# Patient Record
Sex: Male | Born: 1945 | Race: White | Hispanic: No
Health system: Midwestern US, Community
[De-identification: ages and names within clinical notes are randomized; demographics above are authoritative.]

## PROBLEM LIST (undated history)

## (undated) DIAGNOSIS — R109 Unspecified abdominal pain: Secondary | ICD-10-CM

## (undated) DIAGNOSIS — C61 Malignant neoplasm of prostate: Secondary | ICD-10-CM

## (undated) DIAGNOSIS — R972 Elevated prostate specific antigen [PSA]: Secondary | ICD-10-CM

## (undated) DIAGNOSIS — I4891 Unspecified atrial fibrillation: Secondary | ICD-10-CM

---

## 2012-06-26 NOTE — Progress Notes (Signed)
Patient here for voiding trial this morning per Dr. Theodora Blow order. Inserted 120 ccs of sterile water into patient's bladder via catheter/syringe at gravity. 10 ccs of clear fluid removed from balloon. Removed 16 fr catheter without any difficulty. Patient voided 100 ccs of clear yellow urine. Patient advised to drink plenty of water today. Patient instructed to call office back at 2 pm to let us know how he is doing/voiding.

## 2012-06-26 NOTE — Progress Notes (Signed)
Patient's daughter(Krissy Hancock, on [place]) called back and reported that he voided 5 times since leaving the office. Patient's daughter reported that he had a steady stream, stream got better with each void. Patient denied straining to urinate. Patient reported leaking a little. Informed patient's daughter to call office back if dad has difficulty urinating. Patient's daughter knows to go to ER if patient unable to urinate during the middle of night. Made patient's daughter aware of our [facility] hours, 7 am- 2 pm.

## 2012-06-27 LAB — AMB POC URINALYSIS DIP STICK AUTO W/O MICRO
Bilirubin (UA POC): NEGATIVE
Ketones (UA POC): NEGATIVE
Leukocyte esterase (UA POC): NEGATIVE
Nitrites (UA POC): NEGATIVE
Protein (UA POC): NEGATIVE mg/dL
Specific gravity (UA POC): 1.02 (ref 1.001–1.035)
Urobilinogen (UA POC): 0.2 (ref 0.2–1)
pH (UA POC): 6 (ref 4.6–8.0)

## 2012-06-27 LAB — AMB POC PVR, MEAS,POST-VOID RES,US,NON-IMAGING: PVR: 236 cc

## 2012-06-27 NOTE — Progress Notes (Signed)
Assessment/Plan:             1. BPH (benign prostatic hypertrophy) with urinary obstruction     2. Urinary retention  AMB POC URINALYSIS DIP STICK AUTO W/O MICRO, AMB POC PVR, MEAS,POST-VOID RES,US,NON-IMAGING     Increase Flomax to 2 at a time  F/U tomorrow for PVR        Subjective:   [name] is a 66 y.o. Caucasian male who is self-referred for evaluation and treatment of urinary difficulty.  Pt had a VT yesterday and was having voiding last night.  States he stream was dribbling, he had freq/urgency, and was voiding about every 1 hr.  States he is urinating better since he has been up moving around.    Current Outpatient Prescriptions   Medication Sig Dispense Refill   ??? tamsulosin (FLOMAX) 0.4 mg capsule Take 0.4 mg by mouth daily.         No Known Allergies  History reviewed. No pertinent past medical history.  History reviewed. No pertinent past surgical history.  Family History   Problem Relation Age of Onset   ??? Diabetes Mother    ??? Hypertension Mother    ??? Diabetes Sister    ??? Hypertension Sister      History   Substance Use Topics   ??? Smoking status: Never Smoker    ??? Smokeless tobacco: Not on file   ??? Alcohol Use: No        Review of Systems  Const: Neg for fever, neg for chills, neg for weight loss, neg for change in appetite, neg for changes in energy level  Eyes: Neg for visual disturbance, neg for pain, neg for discharge  ENT: Neg for difficulty speaking, neg for pain with swallowing, neg for hearing difficulty  Resp: Neg for Shortness of breath, neg for cough, neg for sputum, neg for hemoptysis  Cardio: Neg for chest pain, neg for rapid heartbeat, neg for irregular heartbeat  GU: Neg for history of kidney stones, neg for frequent UTI, neg for flank pain  GI: Neg for constipation, neg for diarrhea, neg for melena, neg for hematochezia  MSK: Neg for bone pain, neg for muscular weakness, neg for muscular tenderness  Skin: Neg for skin conditions, neg for skin rashes  Neuro: Neg for focal  weakness, neg for numbness, neg for seizures  Psych: Neg for history of psychiatric illness  Endo: Neg for diabetes, neg for thyroid disease, neg for excessive urination, neg for excessive thirst, neg for heat intolerance  Lymph: Neg for frequent infections, neg for easy bruising, neg for  lymph node enlargement    Objective:     Estimated Body mass index is 30.41 kg/(m^2) as calculated from the following:    Height as of this encounter: 5\' 8" (1.727 m).    Weight as of this encounter: 200 lb(90.719 kg).   BP 130/76   Ht 5\' 8"  (1.727 m)   Wt 200 lb (90.719 kg)   BMI 30.41 kg/m2  General appearance: alert, cooperative, no distress, appears stated age  Head: Normocephalic, without obvious abnormality, atraumatic  Neck: supple, symmetrical, trachea midline and no JVD  Back: symmetric, no curvature. ROM normal. No CVA tenderness.  Abdomen: normal findings: soft, non-tender  Extremities: extremities normal, atraumatic, no cyanosis or edema  Skin: Skin color, texture, turgor normal. No rashes or lesions  Neurologic: Grossly normal    Review of Labs, Medical Images, tracings or specimens:    Labs reviewed:   UA  Results for orders placed in visit on 06/27/12   AMB POC URINALYSIS DIP STICK AUTO W/O MICRO       Component Value Range    Color (UA POC) Yellow  (none)    Clarity (UA POC) Clear  (none)    Glucose (UA POC) 4+  (none)    Bilirubin (UA POC) Negative  (none)    Ketones (UA POC) Negative  (none)    Specific gravity (UA POC) 1.020  1.001 - 1.035    Blood (UA POC) Trace  (none)    pH (UA POC) 6.0  4.6 - 8.0    Protein (UA POC) Negative  Negative mg/dL    Urobilinogen (UA POC) 0.2 mg/dL  0.2 - 1    Nitrites (UA POC) Negative  (none)    Leukocyte esterase (UA POC) Negative  (none)   AMB POC PVR, MEAS,POST-VOID RES,US,NON-IMAGING       Component Value Range    PVR 236         This note has been sent to the referring physician, with findings and recommendations.

## 2012-06-28 LAB — AMB POC URINALYSIS DIP STICK AUTO W/O MICRO
Bilirubin (UA POC): NEGATIVE
Ketones (UA POC): NEGATIVE
Leukocyte esterase (UA POC): NEGATIVE
Nitrites (UA POC): NEGATIVE
Protein (UA POC): NEGATIVE mg/dL
Specific gravity (UA POC): 1.03 (ref 1.001–1.035)
Urobilinogen (UA POC): 0.2 (ref 0.2–1)
pH (UA POC): 5.5 (ref 4.6–8.0)

## 2012-06-28 LAB — AMB POC PVR, MEAS,POST-VOID RES,US,NON-IMAGING: PVR: 51 cc

## 2012-06-28 NOTE — Progress Notes (Signed)
06-28-12  Pt in for f/u PVR.  States he feels like he is urinating better.  C/O no having a BM since last week.  Told him it was ok to take a laxative.  I want him to continue taking Fliomax BID till done and then samples of Rapaflo given.  He is to f/u on Mon with Dr. Tye Bear Grass.  Results for orders placed in visit on 06/28/12   AMB POC PVR, MEAS,POST-VOID RES,US,NON-IMAGING       Component Value Range    PVR 51     AMB POC URINALYSIS DIP STICK AUTO W/O MICRO       Component Value Range    Color (UA POC) Yellow  (none)    Clarity (UA POC) Clear  (none)    Glucose (UA POC) 2+  (none)    Bilirubin (UA POC) Negative  (none)    Ketones (UA POC) Negative  (none)    Specific gravity (UA POC) 1.030  1.001 - 1.035    Blood (UA POC) Trace  (none)    pH (UA POC) 5.5  4.6 - 8.0    Protein (UA POC) Negative  Negative mg/dL    Urobilinogen (UA POC) 0.2 mg/dL  0.2 - 1    Nitrites (UA POC) Negative  (none)    Leukocyte esterase (UA POC) Negative  (none)       Lytle Butte, NP      06-27-12  Assessment/Plan:             1. Retention, urine  AMB POC PVR, MEAS,POST-VOID RES,US,NON-IMAGING, AMB POC URINALYSIS DIP STICK AUTO W/O MICRO     Increase Flomax to 2 at a time  F/U tomorrow for PVR        Subjective:   [name] is a 66 y.o. Caucasian male who is self-referred for evaluation and treatment of urinary difficulty.  Pt had a VT yesterday and was having voiding last night.  States he stream was dribbling, he had freq/urgency, and was voiding about every 1 hr.  States he is urinating better since he has been up moving around. PVR=236cc    Current Outpatient Prescriptions   Medication Sig Dispense Refill   ??? tamsulosin (FLOMAX) 0.4 mg capsule Take 0.4 mg by mouth daily.         No Known Allergies  History reviewed. No pertinent past medical history.  History reviewed. No pertinent past surgical history.  Family History   Problem Relation Age of Onset   ??? Diabetes Mother    ??? Hypertension Mother    ??? Diabetes Sister    ??? Hypertension  Sister      History   Substance Use Topics   ??? Smoking status: Never Smoker    ??? Smokeless tobacco: Not on file   ??? Alcohol Use: No        Review of Systems  Const: Neg for fever, neg for chills, neg for weight loss, neg for change in appetite, neg for changes in energy level  Eyes: Neg for visual disturbance, neg for pain, neg for discharge  ENT: Neg for difficulty speaking, neg for pain with swallowing, neg for hearing difficulty  Resp: Neg for Shortness of breath, neg for cough, neg for sputum, neg for hemoptysis  Cardio: Neg for chest pain, neg for rapid heartbeat, neg for irregular heartbeat  GU: Neg for history of kidney stones, neg for frequent UTI, neg for flank pain  GI: Neg for constipation, neg for diarrhea, neg for  melena, neg for hematochezia  MSK: Neg for bone pain, neg for muscular weakness, neg for muscular tenderness  Skin: Neg for skin conditions, neg for skin rashes  Neuro: Neg for focal weakness, neg for numbness, neg for seizures  Psych: Neg for history of psychiatric illness  Endo: Neg for diabetes, neg for thyroid disease, neg for excessive urination, neg for excessive thirst, neg for heat intolerance  Lymph: Neg for frequent infections, neg for easy bruising, neg for  lymph node enlargement    Objective:     Estimated Body mass index is 30.41 kg/(m^2) as calculated from the following:    Height as of this encounter: 5\' 8" (1.727 m).    Weight as of this encounter: 200 lb(90.719 kg).   BP 130/76   Ht 5\' 8"  (1.727 m)   Wt 200 lb (90.719 kg)   BMI 30.41 kg/m2  General appearance: alert, cooperative, no distress, appears stated age  Head: Normocephalic, without obvious abnormality, atraumatic  Neck: supple, symmetrical, trachea midline and no JVD  Back: symmetric, no curvature. ROM normal. No CVA tenderness.  Abdomen: normal findings: soft, non-tender  Extremities: extremities normal, atraumatic, no cyanosis or edema  Skin: Skin color, texture, turgor normal. No rashes or lesions  Neurologic:  Grossly normal    Review of Labs, Medical Images, tracings or specimens:    Labs reviewed:   UA  Results for orders placed in visit on 06/28/12   AMB POC PVR, MEAS,POST-VOID RES,US,NON-IMAGING       Component Value Range    PVR 236     AMB POC URINALYSIS DIP STICK AUTO W/O MICRO       Component Value Range    Color (UA POC) Yellow  (none)    Clarity (UA POC) Clear  (none)    Glucose (UA POC) 2+  (none)    Bilirubin (UA POC) Negative  (none)    Ketones (UA POC) Negative  (none)    Specific gravity (UA POC) 1.030  1.001 - 1.035    Blood (UA POC) Trace  (none)    pH (UA POC) 5.5  4.6 - 8.0    Protein (UA POC) Negative  Negative mg/dL    Urobilinogen (UA POC) 0.2 mg/dL  0.2 - 1    Nitrites (UA POC) Negative  (none)    Leukocyte esterase (UA POC) Negative  (none)       This note has been sent to the referring physician, with findings and recommendations.

## 2012-07-01 LAB — AMB POC URINALYSIS DIP STICK AUTO W/O MICRO
Bilirubin (UA POC): NEGATIVE
Blood (UA POC): NEGATIVE
Ketones (UA POC): NEGATIVE
Nitrites (UA POC): NEGATIVE
Protein (UA POC): NEGATIVE mg/dL
Specific gravity (UA POC): 1.03 (ref 1.001–1.035)
Urobilinogen (UA POC): 0.2 (ref 0.2–1)
pH (UA POC): 5 (ref 4.6–8.0)

## 2012-07-01 LAB — AMB POC PVR, MEAS,POST-VOID RES,US,NON-IMAGING: PVR: 82 cc

## 2012-07-01 NOTE — Progress Notes (Signed)
07/01/2012    Assessment:   1. Normal 40g prostate on DRE today with moderate LUTS, taking flomax 0.4mg  daily with benefit  2. Recent AUR following treatment for sinusitis with OTC anti decongestants, foley catheter placed at [facility], subsequently removed 06/27/12. PVR decreasing, 82cc today  3. Microhematuria likely secondary to UTI, adequately treated and resolved  4. Normal PSA history per pt report    Plan:   1. Continue flomax 0.4mg  daily  2. Follow up 6 weeks with PSA, PVR at that time  3. Discontinue antihistamines    Discussion:  We discussed the options for management of LUTS/Benign Prostatic Hyperplasia, including watchful waiting, over the counter supplements, medical therapy, minimally invasive therapies, laser therapy and transurethral resection/coagulative therapies. The risks and benefits of each option were discussed and the patent desires management.    Watchful Waiting: risks and benefits of this approach were discussed including the fact that BPH is usually a progressive disease and can lead to infection, bladder stones, hematuria, incontinence, bladder damage and kidney damage but not limited to these conditions. The need for on-going monitoring, including PSA, DRE, Flow, Bladder scan, and prostate ultrasound.     History of Present Illness:  [name] is a 66 y.o. white male who presents today for LUTS. Patient reports recent sinus infection treated with cefdinir on 06/20/12. Prior to this, he was taking OTC decongestants. He was hospitalized at [facility] for AUR on 06/22/12 where he was catheterized. PVR elevated per pt report. F[facility] catheter removed in [facility] 06/27/12. Prior to hospitalization, he reports moderate LUTS including intermittency, varying stream, post-void dribbling, sensation of incomplete emptying, intermittent urinary frequency. He denies any gross hematuria or dysuria. Patient has been taking flomax 0.4mg  daily with increased nocturia, night sweats. Aside from this, he is  tolerating medication well with some decrease in LUTS.  IPSS:(1,4,2,2,4,1,2 = 16, bother = 3)    PVR today 82cc    No Fam Hx prostate CA.     Foley urine culture 06/22/12: no growth    No past medical history on file.    No past surgical history on file.    Current Outpatient Prescriptions on File Prior to Visit   Medication Sig Dispense Refill   ??? tamsulosin (FLOMAX) 0.4 mg capsule Take 0.4 mg by mouth daily.           History     Social History   ??? Marital Status: DIVORCED     Spouse Name: N/A     Number of Children: N/A   ??? Years of Education: N/A     Occupational History   ??? Not on file.     Social History Main Topics   ??? Smoking status: Never Smoker    ??? Smokeless tobacco: Not on file   ??? Alcohol Use: No   ??? Drug Use: No   ??? Sexually Active: Not on file     Other Topics Concern   ??? Not on file     Social History Narrative   ??? No narrative on file       family history includes Diabetes in his mother and sister and Hypertension in his mother and sister.    No Known Allergies    Review of Systems:  Constitutional: No Fever, Chills, Malaise, Weakness, Diaphoresis  Skin: No Rash, Itch  Endocrine: No hot or cold intolerance.   Hematology: No anemia, bleeding tendency  HENT: No Headache, runny nose, congestion  Eyes: Altered vision  CV: No  Chest pain, Palpitations, Edema  Pulm: No Dyspnea, SOB, Cough, Wheezing  GI: No Nausea, Vomiting, Heartburn, Diarrhea, Constipation, Abd Pain  Musculoskeletal: No Back pain, Joint pain  Neuro: No Dizziness, Numbness, Tingling, Tremors      Physical Exam:  Ht 5\' 8"  (1.727 m)   Wt 200 lb (90.719 kg)   BMI 30.41 kg/m2  WDWN in NAD  HEENT: NCAT, EOMI  Neck: symmetrical, supple, no LAN  Chest: normal respiratory effort, CTA  CV: RRR, no Murmur  Abdomen: soft, NDNT, no guarding/rebound, no mass/HSM, no CVAT, no inguinal hernias  GU: Normal circ penis, patent meatus, Normal scrotum w/ no lesion, Normal testes, Normal epididymis  DRE: Anus and perineum normal, Prostate 40 grams, smooth,  symmetrical, no nodule. Seminal vesicles non-palpable  Extremities: No c/c/e   Neuro: A&O x 3, no focal deficits    Urinalysis shows:   Results for orders placed in visit on 07/01/12   AMB POC URINALYSIS DIP STICK AUTO W/O MICRO       Component Value Range    Color (UA POC) Yellow  (none)    Clarity (UA POC) Clear  (none)    Glucose (UA POC) 1+  (none)    Bilirubin (UA POC) Negative  (none)    Ketones (UA POC) Negative  (none)    Specific gravity (UA POC) 1.030  1.001 - 1.035    Blood (UA POC) Negative  (none)    pH (UA POC) 5.0  4.6 - 8.0    Protein (UA POC) Negative  Negative mg/dL    Urobilinogen (UA POC) 0.2 mg/dL  0.2 - 1    Nitrites (UA POC) Negative  (none)    Leukocyte esterase (UA POC) Trace  (none)   AMB POC PVR, MEAS,POST-VOID RES,US,NON-IMAGING       Component Value Range    PVR 82         Fortino Sic, MD  [facility]         Documentation provided by Ihor Gully, medical scribe for Dr. Fortino Sic on 07/01/2012

## 2012-07-01 NOTE — Patient Instructions (Addendum)
Urine Test: About This Test  What is it?  A urine test, also called urinalysis, checks the color, clarity (clear or cloudy), odor, concentration, and acidity (pH) of your urine. It also checks for abnormal levels of protein, sugar, blood cells, or other substances in your urine.  Why is this test done?  A urine test may be done:  ?? To check for a disease or infection of the urinary tract. The urinary tract includes the kidneys, the tubes that carry urine from the kidneys to the bladder (ureters), the bladder, and the tube that carries urine from the bladder to outside the body (urethra).   ?? To check the treatment of conditions such as diabetes, kidney stones, a urinary tract infection (UTI), high blood pressure, or some kidney or liver diseases.   ?? As part of a regular physical exam.   How can you prepare for the test?  ?? Do not eat foods that can change the color of your urine, such as blackberries, beets, and rhubarb, before the test.   ?? Do not exercise strenuously before the test.   ?? Tell your doctor if you are menstruating or close to starting your menstrual period. Your doctor may want to wait to do the test.   ?? Tell your doctor about all the nonprescription and prescription medicines and herbs or other supplements you take. Some medicines and supplements can affect the results of this test.   What happens during the test?  A urine test can be done in your doctor's office, clinic, or lab. Or you may be asked to collect a urine sample at home and bring it with you to the office or lab to be tested.  Clean-catch midstream urine collection  ?? Wash your hands to make sure they are clean before collecting the urine.   ?? If the collection cup has a lid, remove it carefully and set it down with the inner surface up. Do not touch the inside of the cup with your fingers.   ?? Clean the area around your genitals.   ?? For men: Retract the foreskin, if present, and clean the head of your penis with medicated  towelettes or swabs.   ?? For women: Spread open the genital folds of skin with one hand. Then use medicated towelettes or swabs in your other hand to clean the area where urine comes out (the urethra). Wipe the area from front to back.   ?? Begin urinating into the toilet or urinal. A woman should hold apart the genital folds of skin while she urinates.   ?? After the urine has flowed for several seconds, place the collection cup into the urine stream and collect about 2 ounces of urine without stopping your flow of urine.   ?? Do not touch the rim of the cup to your genital area. Do not get toilet paper, pubic hair, stool (feces), menstrual blood, or anything else in the urine sample.   ?? When you have collected enough urine in the cup, pull the cup away and finish urinating into the toilet or urinal.   ?? Carefully replace and tighten the lid on the cup, and then return it to the lab. If you are collecting the urine at home and cannot get it to the lab in an hour, refrigerate it.   Double-voided urine sample collection  This method collects the urine your body is making right now.  ?? Urinate into the toilet or urinal. Do not collect any   of this urine.   ?? Drink a large glass of water and wait about 30 to 40 minutes.   ?? Then get a urine sample. Follow the instructions above for collecting a clean-catch urine sample.   ?? Return the urine sample to the lab. If you are collecting the urine at home and cannot get it to the lab in an hour, refrigerate it.   Follow-up care is a key part of your treatment and safety. Be sure to make and go to all appointments, and call your doctor if you are having problems. It's also a good idea to keep a list of the medicines you take. Ask your doctor when you can expect to have your test results.    Where can you learn more?    Go to [email or web address]   Enter R266 in the search box to learn more about "Urine Test: About This Test."    ?? 2006-2013 [employer],  In[employer]ated. Care instructions adapted under license by [employer] (which disclaims liability or warranty for this information). This care instruction is for use with your licensed healthcare professional. If you have questions about a medical condition or this instruction, always ask your healthcare professional. [employer], Incorporated disclaims any warranty or liability for your use of this information.  Content Version: 9.7.130178; Last Revised: September 28, 2010        MyChart Activation    Thank you for requesting access to MyChart. Please follow the instructions below to securely access and download your online medical record. MyChart allows you to send messages to your doctor, view your test results, renew your prescriptions, schedule appointments, and more.    How Do I Sign Up?    1. In your internet browser, go to [email or web address]  2. Click on the First Time User? Click Here link in the Sign In box. You will be redirect to the New Member Sign Up page.  3. Enter your MyChart Access Code exactly as it appears below. You will not need to use this code after you???ve completed the sign-up process. If you do not sign up before the expiration date, you must request a new code.    MyChart Access Code: [email or web address]  Expires: 09/26/2012  3:08 PM (This is the date your MyChart access code will expire)    4. Enter the last four digits of your Social Security Number (xxxx) and Date of Birth (mm/dd/yyyy) as indicated and click Submit. You will be taken to the next sign-up page.  5. Create a MyChart ID. This will be your MyChart login ID and cannot be changed, so think of one that is secure and easy to remember.  6. Create a MyChart password. You can change your password at any time.  7. Enter your Password Reset Question and Answer. This can be used at a later time if you forget your password.   8. Enter your e-mail address. You will receive e-mail notification when new information is available in MyChart.   9. Click Sign Up. You can now view and download portions of your medical record.  10. Click the Download Summary menu link to download a portable copy of your medical information.    Additional Information    If you have questions, please visit the Frequently Asked Questions section of the MyChart website at [email or web address] Remember, MyChart is NOT to be used for urgent needs. For medical emergencies, dial 911.

## 2012-08-20 LAB — AMB POC URINALYSIS DIP STICK AUTO W/O MICRO
Bilirubin (UA POC): NEGATIVE
Blood (UA POC): NEGATIVE
Ketones (UA POC): NEGATIVE
Leukocyte esterase (UA POC): NEGATIVE
Nitrites (UA POC): NEGATIVE
Protein (UA POC): NEGATIVE mg/dL
Specific gravity (UA POC): 1.03 (ref 1.001–1.035)
Urobilinogen (UA POC): 0.2 (ref 0.2–1)
pH (UA POC): 5.5 (ref 4.6–8.0)

## 2012-08-20 LAB — PSA, DIAGNOSTIC (PROSTATE SPECIFIC AG): Prostate Specific Ag: 5.99 ng/mL — AB (ref 0.00–4.00)

## 2012-08-20 LAB — AMB POC PVR, MEAS,POST-VOID RES,US,NON-IMAGING: PVR: 54 cc

## 2012-08-20 MED ORDER — SILODOSIN 8 MG CAPSULE
8 mg | ORAL_CAPSULE | Freq: Every day | ORAL | Status: DC
Start: 2012-08-20 — End: 2012-08-27

## 2012-08-20 NOTE — Progress Notes (Signed)
08/20/2012    Assessment:   1. Normal 40g prostate with moderate LUTS, elevated PSA, recent AUR all suggest BPH. Is taking rapaflo with improvement in symptoms. I recommended he continue  2. Recent AUR following treatment for sinusitis with OTC anti decongestants, PVR 54cc today currently on rapaflo  3. Microhematuria likely secondary to UTI, adequately treated and resolved  4. Normal PSA history per pt report, today 5.99ng/ml. I offered continue observation or prostate biopsy. He wants to repeat biopsy in 73mo    Plan:   1. Continue rapaflo 8mg  daily  2. Follow up 73mo with same day PSA, PVR, IPSS   3. Discontinue antihistamines    History of Present Illness:  [name] is a 66 y.o. white male who presents in follow up for LUTS/AUR.  Patient reports recent sinus infection treated with cefdinir on 06/20/12 and he took OTC decongestants. He was hospitalized at [facility] for AUR on 06/22/12 where he was catheterized. He was placed on Rapaflo when I last saw him. He reports 'best stream in years'. He does report retrograde ejaculation. PVR 54cc today. Prior to hospitalization, he reports moderate LUTS including intermittency, varying stream, post-void dribbling, sensation of incomplete emptying, intermittent urinary frequency. He denies any gross hematuria or dysuria. IPSS:(1,4,2,2,4,1,2 = 16, bother = 3)    No Fam Hx prostate CA. Prior prostate biopsy 10years ago normal    Foley urine culture 06/22/12: no growth    No past medical history on file.    No past surgical history on file.    No current outpatient prescriptions on file prior to visit.     No current facility-administered medications on file prior to visit.       History     Social History   ??? Marital Status: DIVORCED     Spouse Name: N/A     Number of Children: N/A   ??? Years of Education: N/A     Occupational History   ??? Not on file.     Social History Main Topics   ??? Smoking status: Never Smoker    ??? Smokeless tobacco: Not on file   ??? Alcohol Use: No   ??? Drug  Use: No   ??? Sexually Active: Not on file     Other Topics Concern   ??? Not on file     Social History Narrative   ??? No narrative on file       family history includes Diabetes in his mother and sister and Hypertension in his mother and sister.    No Known Allergies    Review of Systems:  Constitutional: No Fever, Chills, Malaise, Weakness, Diaphoresis, admits to night sweats  CV: No Chest pain, Palpitations, Edema  Pulm: No Dyspnea, SOB, Cough, Wheezing  GI: No Nausea, Vomiting, Heartburn, Diarrhea, Constipation, Abd Pain    Physical Exam:  BP 112/70   Ht 5\' 8"  (1.727 m)   Wt 200 lb (90.719 kg)   BMI 30.42 kg/m2  WDWN in NAD    Urinalysis shows:   Results for orders placed in visit on 08/20/12   AMB POC URINALYSIS DIP STICK AUTO W/O MICRO       Result Value Range    Color (UA POC) Yellow      Clarity (UA POC) Clear      Glucose (UA POC) 1+  Negative    Bilirubin (UA POC) Negative  Negative    Ketones (UA POC) Negative  Negative    Specific gravity (UA POC)  1.030  1.001 - 1.035    Blood (UA POC) Negative  Negative    pH (UA POC) 5.5  4.6 - 8.0    Protein (UA POC) Negative  Negative mg/dL    Urobilinogen (UA POC) 0.2 mg/dL  0.2 - 1    Nitrites (UA POC) Negative  Negative    Leukocyte esterase (UA POC) Negative  Negative   PROSTATE SPECIFIC AG (PSA)       Result Value Range    Prostate Specific Ag 5.99 (*) 0.00 - 4.00 ng/mL   AMB POC PVR, MEAS,POST-VOID RES,US,NON-IMAGING       Result Value Range    PVR 54         Fortino Sic, MD  [facility]       Documentation provided by Ihor Gully, medical scribe for Dr. Fortino Sic on 08/20/2012

## 2012-08-20 NOTE — Progress Notes (Signed)
Same day PSA obtained via venipuncture without difficulty per Dr. Wason's order. Patient to receive results today in office.

## 2012-08-26 NOTE — Telephone Encounter (Signed)
Pt called the Office asking if the Rapaflo could be changed back to Flomax, he states that he is having night sweats with the Rapaflo.

## 2012-08-27 MED ORDER — TAMSULOSIN SR 0.4 MG 24 HR CAP
0.4 mg | ORAL_CAPSULE | Freq: Every day | ORAL | Status: DC
Start: 2012-08-27 — End: 2013-04-04

## 2012-08-27 NOTE — Telephone Encounter (Signed)
Thanks fine, can you call in flomax? thanks ----- Message ----- From: Kelby Fam Sent: 08/26/2012 4:02 PM To: Arlyce Harman, MD ----- Message ----- From: Corky Mull Sent: 08/26/2012 9:52 AM To: Kelby Fam ----- Message from Corky Mull sent at 08/26/2012 9:52 AM ----- Asking for an medication change, would like to pickup tomorrow while he is here in [place]...jlewis,ma

## 2012-11-05 LAB — AMB POC URINALYSIS DIP STICK AUTO W/O MICRO
Nitrites (UA POC): NEGATIVE
Specific gravity (UA POC): 1.03 (ref 1.001–1.035)
Urobilinogen (UA POC): 1 (ref 0.2–1)
pH (UA POC): 5.5 (ref 4.6–8.0)

## 2012-11-05 LAB — AMB POC PVR, MEAS,POST-VOID RES,US,NON-IMAGING: PVR: 17 cc

## 2012-11-05 NOTE — Progress Notes (Signed)
Assessment/Plan:             1. Retention of urine  AMB POC PVR, MEAS,POST-VOID RES,US,NON-IMAGING, AMB POC URINALYSIS DIP STICK AUTO W/O MICRO, CULTURE, URINE   2. Other nonspecific finding on examination of urine  CULTURE, URINE     Stop taking OTC and prescription Tamiflu and Cheratussin AC  Discussed that these medications could cause problems with urination in men with BPH        Subjective:   [name] is a 67 y.o. Caucasian male who is referred by:  No primary provider on file. for evaluation and treatment of problems with urination.  Pt was placed on Tamiflu and Cheratussin AC and started having problems with urination, such as weakened stream, urinating without warning.  This occurred after starting on the medications.  States he has been urinating fine since his last appointment.  Denies dysuria, freq/urgency.    Patient Active Problem List   Diagnosis Code   ??? Enlarged prostate with lower urinary tract symptoms (LUTS) 600.01   ??? Urinary retention 788.20   ??? UTI (lower urinary tract infection) 599.0     Patient Active Problem List    Diagnosis Date Noted   ??? Enlarged prostate with lower urinary tract symptoms (LUTS) 07/01/2012   ??? Urinary retention 07/01/2012   ??? UTI (lower urinary tract infection) 07/01/2012     Current Outpatient Prescriptions   Medication Sig Dispense Refill   ??? oseltamivir (TAMIFLU) 75 mg capsule Take  by mouth two (2) times a day.       ??? guaiFENesin-codeine (CHERATUSSIN AC) 10-100 mg/5 mL solution Take 5 mL by mouth three (3) times daily as needed for Cough.       ??? tamsulosin (FLOMAX) 0.4 mg capsule Take 1 Cap by mouth daily (after dinner).  30 Cap  5     No Known Allergies  Past Medical History   Diagnosis Date   ??? UTI (lower urinary tract infection)    ??? Urinary retention      No past surgical history on file.  Family History   Problem Relation Age of Onset   ??? Diabetes Mother    ??? Hypertension Mother    ??? Diabetes Sister    ??? Hypertension Sister      History   Substance  Use Topics   ??? Smoking status: Never Smoker    ??? Smokeless tobacco: Not on file   ??? Alcohol Use: No        Review of Systems  Const: Neg for fever, neg for chills, neg for weight loss, neg for change in appetite, neg for changes in energy level  Eyes: Neg for visual disturbance, neg for pain, neg for discharge  ENT: Neg for difficulty speaking, neg for pain with swallowing, neg for hearing difficulty  Resp: Neg for Shortness of breath, neg for cough, neg for sputum, neg for hemoptysis  Cardio: Neg for chest pain, neg for rapid heartbeat, neg for irregular heartbeat  GU: Neg for history of kidney stones, neg for frequent UTI, neg for flank pain  GI: Neg for constipation, neg for diarrhea, neg for melena, neg for hematochezia  MSK: Neg for bone pain, neg for muscular weakness, neg for muscular tenderness  Skin: Neg for skin conditions, neg for skin rashes  Neuro: Neg for focal weakness, neg for numbness, neg for seizures  Psych: Neg for history of psychiatric illness  Endo: Neg for diabetes, neg for thyroid disease, neg for excessive urination,  neg for excessive thirst, neg for heat intolerance  Lymph: Neg for frequent infections, neg for easy bruising, neg for  lymph node enlargement    Objective:     Estimated body mass index is 31.03 kg/(m^2) as calculated from the following:    Height as of this encounter: 5\' 8"  (1.727 m).    Weight as of this encounter: 204 lb (92.534 kg).   BP 120/68   Ht 5\' 8"  (1.727 m)   Wt 204 lb (92.534 kg)   BMI 31.03 kg/m2  General appearance: alert, cooperative, no distress, appears stated age  Head: Normocephalic, without obvious abnormality, atraumatic  Back: symmetric, no curvature. ROM normal. No CVA tenderness.  Abdomen: normal findings: soft, non-tender  Extremities: extremities normal, atraumatic, no cyanosis or edema  Skin: Skin color, texture, turgor normal. No rashes or lesions  Neurologic: Grossly normal    Review of Labs, Medical Images, tracings or specimens:    Labs  reviewed:   UA  Results for orders placed in visit on 11/05/12   AMB POC PVR, MEAS,POST-VOID RES,US,NON-IMAGING       Result Value Range    PVR 17     AMB POC URINALYSIS DIP STICK AUTO W/O MICRO       Result Value Range    Color (UA POC) Yellow      Clarity (UA POC) Slightly Cloudy      Glucose (UA POC) 4+  Negative    Bilirubin (UA POC) 1+  Negative    Ketones (UA POC) Trace  Negative    Specific gravity (UA POC) 1.030  1.001 - 1.035    Blood (UA POC) 4+  Negative    pH (UA POC) 5.5  4.6 - 8.0    Protein (UA POC) 3+  Negative mg/dL    Urobilinogen (UA POC) 1 mg/dL  0.2 - 1    Nitrites (UA POC) Negative  Negative    Leukocyte esterase (UA POC) Trace  Negative       This note has been sent to the referring physician, with findings and recommendations.

## 2012-11-08 MED ORDER — CIPROFLOXACIN 500 MG TAB
500 mg | ORAL_TABLET | Freq: Two times a day (BID) | ORAL | Status: AC
Start: 2012-11-08 — End: 2012-11-18

## 2012-11-08 NOTE — Telephone Encounter (Signed)
Patient was called and advised of the positive culture. Patient's daughter stated that the patient was driving and I verified her name [name]) and she is on [employer]. She was advised of the positive culture and the medication refill Cipro 500 mg BID x 10 days. That was sent to the pharmacy that is listed in the chart.

## 2012-11-08 NOTE — Progress Notes (Signed)
Quick Note:    Start Cipro 500mg  BID x 10 days  Lytle Butte, NP    ______

## 2012-11-08 NOTE — Telephone Encounter (Signed)
Message copied by Candie Echevaria on Fri Nov 08, 2012 11:05 AM  ------       Message from: Lytle Butte A       Created: Fri Nov 08, 2012 11:01 AM         Start Cipro 500mg  BID x 10 days       Lytle Butte, NP         ------

## 2013-04-04 LAB — PSA, DIAGNOSTIC (PROSTATE SPECIFIC AG): Prostate Specific Ag: 4.73 ng/mL — AB (ref 0.00–4.00)

## 2013-04-04 MED ORDER — TAMSULOSIN SR 0.4 MG 24 HR CAP
0.4 mg | ORAL_CAPSULE | Freq: Every day | ORAL | Status: DC
Start: 2013-04-04 — End: 2013-04-18

## 2013-04-04 NOTE — Telephone Encounter (Signed)
[name], [name] ','<More Detail >>          QUESTION ABOUT [name]  [name] [phone])  MRN: [id number] DOB: 14-Oct-1945      Pt Home: [id number]      Sent: [name] April 04, 2013 9:12 AM      To: [name]                                     Message      Pt said he has finished the bottle of [name] that was given to him and he does not have any refills left. He would like to know if he needs to continue taking it or if he can stop. Pt said he he needs to continue to take it please call it into the pharmacy he uses which is [facility]. Pt can be reached at [phone]        Thanks

## 2013-04-04 NOTE — Progress Notes (Signed)
[name] presents today for lab draw per [name]'s order.    Patient will be notified by the physician with lab results.    PSA obtained via venipuncture without any difficulty.      Orders Placed This Encounter   ??? PROSTATE SPECIFIC AG       [name] on 04/04/2013

## 2013-04-18 LAB — AMB POC URINALYSIS DIP STICK AUTO W/ MICRO (MICRO RESULTS)
Bilirubin (UA POC): NEGATIVE
Leukocyte esterase (UA POC): NEGATIVE
Nitrites (UA POC): NEGATIVE
Protein (UA POC): NEGATIVE mg/dL
Specific gravity (UA POC): 1.03 (ref 1.001–1.035)
Urobilinogen (UA POC): 0.2 (ref 0.2–1)
pH (UA POC): 5.5 (ref 4.6–8.0)

## 2013-04-18 LAB — AMB POC PVR, MEAS,POST-VOID RES,US,NON-IMAGING: PVR: 52 cc

## 2013-04-18 MED ORDER — TAMSULOSIN SR 0.4 MG 24 HR CAP
0.4 mg | ORAL_CAPSULE | Freq: Every day | ORAL | Status: DC
Start: 2013-04-18 — End: 2013-12-16

## 2013-04-18 NOTE — Patient Instructions (Signed)
Prostate Cancer Screening: After Your Visit  Your Care Instructions  The prostate gland is an organ found just below a man's bladder. It is the size and shape of a walnut. It surrounds the tube that carries urine from the bladder out of the body through the penis. This tube is called the urethra.  Prostate cancer is the abnormal growth of cells in the prostate. It is the second most common type of cancer in men. (Skin cancer is the most common.)  Most cases of prostate cancer occur in men older than 65. The disease runs in families. And it's more common in African-American men.  When it's found and treated early, prostate cancer may be cured. But it is not always treated. This is because prostate cancer may not shorten your life, especially if you are older and the cancer is growing slowly.  Follow-up care is a key part of your treatment and safety. Be sure to make and go to all appointments, and call your doctor if you are having problems. It's also a good idea to know your test results and keep a list of the medicines you take.  What are the screening tests for prostate cancer?  There are two screening tests to help find prostate cancer. One is the prostate-specific antigen (PSA) test. The other is a digital rectal exam.  ?? The PSA test is a blood test that measures how much PSA is in your blood. A high level may mean that you have an enlargement, infection, or cancer.  ?? The digital (finger) rectal exam checks for anything abnormal in your prostate. To do the exam, the doctor puts a lubricated, gloved finger into your rectum.  ?? Neither test can diagnose prostate cancer on its own. If these tests point to cancer, your doctor will probably advise you to get a prostate biopsy.  How is prostate cancer diagnosed?  In a biopsy, the doctor takes small tissue samples from your prostate gland. Another doctor then looks at the tissue under a microscope to see if there are cancer cells, signs of infection, or other  problems. The results help diagnose prostate cancer.  What are the pros and cons of screening?  Neither a PSA test nor a digital rectal exam can tell you for sure that you do or do not have cancer. But they can help you decide if you need more tests, such as a prostate biopsy. Screening tests may be useful because most men with prostate cancer don't have symptoms. It can be hard to know if you have cancer until it is more advanced. And then it's harder to treat.  Prostate cancer usually develops late in life and grows slowly. For many men, it does not shorten their lives. Some experts advise screening only for men who are at high risk.  Talk with your doctor to see if screening is right for you.   Where can you learn more?   Go to http://www.healthwise.net/[facility]  Enter R550 in the search box to learn more about "Prostate Cancer Screening: After Your Visit."   ?? 2006-2014 Healthwise, Inc[employer]. Care instructions adapted under license by [facility] (which disclaims liability or warranty for this information). This care instruction is for use with your licensed healthcare professional. If you have questions about a medical condition or this instruction, always ask your healthcare professional. Healthwise, Incorporated disclaims any warranty or liability for your use of this information.  Content Version: 10.0.270728; Last Revised: July 16, 2012

## 2013-04-18 NOTE — Addendum Note (Signed)
Addended by: Arlyce Harman on: 04/18/2013 10:31 AM     Modules accepted: Orders

## 2013-04-18 NOTE — Progress Notes (Addendum)
[name]  1946/05/13    Assessment:   1. Normal 40g prostate on DRE today with mild LUTS, improved on flomax 0.4mg  per day. Emptying fairly well, PVR 52cc  2. AUR following URI with OTC decongestants, resolved  3. BPH, s/p negative prostate biopsy ~2003  4. PSA 5.99ng/ml in 08/2012, down to 4.73 in 03/2013. I offered continue observation or prostate biopsy. He would like continued observation at this time  5. Microhematuria on dipstick, no RBCs or WBCs on microscopy    Plan:   1. Continue flomax 0.4mg  per day, rx provided  2. F/u 8 months with repeat PSA prior    Chief Complaint   Patient presents with   ??? Benign Prostatic Hypertrophy     patient in for psa results   ??? (LUTS) Lower Urinary Tract Symptoms     History of Present Illness:  [name] is a 67 y.o. white male who returns today in follow up for BPH with LUTS. Patient has history of elevated PSA, s/p negative prostate biopsy ~2003 per pt report. Patient has history of urinary retention requiring catheterization secondary to decongestant with URI in 2013. He was started on flomax 0.4mg  per day, which he continues. He notes benefit from alpha blocker. He recently discontinued for several days after running out of refills and notes decreased stream, nocturia, intermittency, urgency off medical therapy. Patient does report retrograde ejaculation as a SE. He denies gross hematuria, dysuria. No family history of prostate cancer. PVR 52cc today.    AUA Assessment Score:  AUA Score: 6;    AUA Bother Rating: Mixed-about equally satisfied      PSA PSA   Latest Ref Rng 0.00 - 4.00 ng/mL   04/04/2013 4.73 (A)   08/20/2012 5.99 (A)       Review of Systems  Constitutional: Fever: No  Skin: Rash: No  HEENT: Hearing difficulty: No  Eyes: Blurred vision: No  Cardiovascular: Chest pain: No  Respiratory: Shortness of breath: No  Gastrointestinal: Nausea/vomiting: No  Musculoskeletal: Back pain: Yes  Neurological: Weakness: No  Psychological: Memory loss: No   Comments/additional findings:   All other systems reviewed and are negative    Past Medical History   Diagnosis Date   ??? UTI (lower urinary tract infection)    ??? Urinary retention        History reviewed. No pertinent past surgical history.    History   Substance Use Topics   ??? Smoking status: Never Smoker    ??? Smokeless tobacco: Not on file   ??? Alcohol Use: No       No Known Allergies    Family History   Problem Relation Age of Onset   ??? Diabetes Mother    ??? Hypertension Mother    ??? Diabetes Sister    ??? Hypertension Sister        Current Outpatient Prescriptions   Medication Sig Dispense Refill   ??? tamsulosin (FLOMAX) 0.4 mg capsule Take 1 Cap by mouth daily (after dinner).  90 Cap  3   ??? oseltamivir (TAMIFLU) 75 mg capsule Take  by mouth two (2) times a day.       ??? guaiFENesin-codeine (CHERATUSSIN AC) 10-100 mg/5 mL solution Take 5 mL by mouth three (3) times daily as needed for Cough.             Physical Exam:  BP 142/84   Ht 5\' 8"  (1.727 m)   Wt 204 lb (92.534 kg)  BMI 31.03 kg/m2  Constitutional: WDWN, Pleasant and appropriate affect, No acute distress.    GU Male:    DRE: Perineum normal to visual inspection, no erythema or irritation, Sphincter with good tone, Rectum with no hemorrhoids, fissures or masses.  Prostate is 40g, benign, smooth, no nodules  SCROTUM:  No scrotal rash or lesions noticed.  Normal bilateral testes and epididymis.   PENIS: Urethral meatus normal in location and size. No urethral discharge.  Skin: No jaundice.    Neuro/Psych:  Alert and oriented x 3. Affect appropriate.       Urinalysis shows:   Results for orders placed in visit on 04/18/13   AMB POC PVR, MEAS,POST-VOID RES,US,NON-IMAGING       Result Value Range    PVR 52     AMB POC URINALYSIS DIP STICK AUTO W/ MICRO (MICRO RESULTS)       Result Value Range    Color (UA POC)        Clarity (UA POC)        Glucose (UA POC) 2+  Negative    Bilirubin (UA POC) Negative  Negative    Ketones (UA POC) Trace  Negative    Specific gravity  (UA POC) 1.030  1.001 - 1.035    Blood (UA POC) Trace  Negative    pH (UA POC) 5.5  4.6 - 8.0    Protein (UA POC) Negative  Negative mg/dL    Urobilinogen (UA POC) 0.2 mg/dL  0.2 - 1    Nitrites (UA POC) Negative  Negative    Leukocyte esterase (UA POC) Negative  Negative    Epithelial cells (UA POC)        WBCs (UA POC) 0-2      RBCs (UA POC) 0-2      Bacteria (UA POC)        Crystals (UA POC)    Negative    Other (UA POC)           Fortino Sic, MD  [facility]     Documentation provided by Ihor Gully, medical scribe for Dr. Fortino Sic on 04/18/2013      ICD-9-CM    1. Lower urinary tract symptoms (LUTS) 788.99 AMB POC URINALYSIS DIP STICK AUTO W/ MICRO (MICRO RESULTS)   2. BPH (benign prostatic hyperplasia) 600.90 AMB POC URINALYSIS DIP STICK AUTO W/ MICRO (MICRO RESULTS)   3. Incomplete bladder emptying 788.21 AMB POC PVR, MEAS,POST-VOID RES,US,NON-IMAGING     AMB POC URINALYSIS DIP STICK AUTO W/ MICRO (MICRO RESULTS)

## 2013-12-08 NOTE — Progress Notes (Signed)
[name] presents today for lab draw per Dr. Kara Pacer order.    Patient will be notified by the physician with lab results.    PSA obtained via venipuncture without any difficulty.      Orders Placed This Encounter   ??? PROSTATE SPECIFIC ANTIGEN, TOTAL (PSA)       Scherrie Seneca T Grete Bosko on 12/08/2013

## 2013-12-09 LAB — PROSTATE SPECIFIC ANTIGEN, TOTAL (PSA): Prostate Specific Ag: 5.68 ng/mL — ABNORMAL HIGH (ref 0.00–4.00)

## 2013-12-16 LAB — AMB POC URINALYSIS DIP STICK AUTO W/O MICRO
Bilirubin (UA POC): NEGATIVE
Blood (UA POC): NEGATIVE
Leukocyte esterase (UA POC): NEGATIVE
Nitrites (UA POC): NEGATIVE
Protein (UA POC): NEGATIVE mg/dL
Specific gravity (UA POC): 1.03 (ref 1.001–1.035)
Urobilinogen (UA POC): 0.2 (ref 0.2–1)
pH (UA POC): 5.5 (ref 4.6–8.0)

## 2013-12-16 MED ORDER — TAMSULOSIN SR 0.4 MG 24 HR CAP
0.4 mg | ORAL_CAPSULE | Freq: Every day | ORAL | Status: DC
Start: 2013-12-16 — End: 2014-06-29

## 2013-12-16 NOTE — Progress Notes (Deleted)
[name]  Oct 04, 1946    Assessment:   1. Normal 40g prostate on DRE today with mild LUTS, improved on flomax 0.4mg  per day. Emptying fairly well, PVR 52cc  2. AUR following URI with OTC decongestants, resolved  3. BPH, s/p negative prostate biopsy ~2003  4. PSA 5.99ng/ml in 08/2012, down to 4.73 in 03/2013. I offered continue observation or prostate biopsy. He would like continued observation at this time  5. Microhematuria on dipstick, no RBCs or WBCs on microscopy    Plan:   1. Continue flomax 0.4mg  per day, rx provided  2. F/u 8 months with repeat PSA prior    Chief Complaint   Patient presents with   ??? (LUTS) Lower Urinary Tract Symptoms     pt in for his 8 months follow up     History of Present Illness:  [name] is a 68 y.o. white male who returns today in follow up for BPH with LUTS. Patient has history of elevated PSA, s/p negative prostate biopsy ~2003 per pt report. Patient has history of urinary retention requiring catheterization secondary to decongestant with URI in 2013. He was started on flomax 0.4mg  per day, which he continues. He notes benefit from alpha blocker. He recently discontinued for several days after running out of refills and notes decreased stream, nocturia, intermittency, urgency off medical therapy. Patient does report retrograde ejaculation as a SE. He denies gross hematuria, dysuria. No family history of prostate cancer. PVR 52cc today.    AUA Assessment Score:  AUA Score: 6;    AUA Bother Rating: Pleased      PSA PSA   Latest Ref Rng 0.00 - 4.00 ng/mL   04/04/2013 4.73 (A)   08/20/2012 5.99 (A)       Review of Systems  Constitutional: Fever: No  Skin: Rash: No  HEENT: Hearing difficulty: No  Eyes: Blurred vision: No  Cardiovascular: Chest pain: No  Respiratory: Shortness of breath: No  Gastrointestinal: Nausea/vomiting: No  Musculoskeletal: Back pain: No  Neurological: Weakness: No  Psychological: Memory loss: No  Comments/additional findings:   All other systems  reviewed and are negative    Past Medical History   Diagnosis Date   ??? UTI (lower urinary tract infection)    ??? Urinary retention        History reviewed. No pertinent past surgical history.    History   Substance Use Topics   ??? Smoking status: Never Smoker    ??? Smokeless tobacco: Not on file   ??? Alcohol Use: No       No Known Allergies    Family History   Problem Relation Age of Onset   ??? Diabetes Mother    ??? Hypertension Mother    ??? Diabetes Sister    ??? Hypertension Sister        Current Outpatient Prescriptions   Medication Sig Dispense Refill   ??? tamsulosin (FLOMAX) 0.4 mg capsule Take 1 Cap by mouth daily (after dinner).  90 Cap  3   ??? oseltamivir (TAMIFLU) 75 mg capsule Take  by mouth two (2) times a day.       ??? guaiFENesin-codeine (CHERATUSSIN AC) 10-100 mg/5 mL solution Take 5 mL by mouth three (3) times daily as needed for Cough.             Physical Exam:  BP 120/70    Ht 5\' 8"  (1.727 m)    Wt 204 lb (92.534 kg)    BMI 31.03  kg/m2     Constitutional: WDWN, Pleasant and appropriate affect, No acute distress.    GU Male:    DRE: Perineum normal to visual inspection, no erythema or irritation, Sphincter with good tone, Rectum with no hemorrhoids, fissures or masses.  Prostate is 40g, benign, smooth, no nodules  SCROTUM:  No scrotal rash or lesions noticed.  Normal bilateral testes and epididymis.   PENIS: Urethral meatus normal in location and size. No urethral discharge.  Skin: No jaundice.    Neuro/Psych:  Alert and oriented x 3. Affect appropriate.       Urinalysis shows:   Results for orders placed in visit on 12/08/13   PROSTATE SPECIFIC ANTIGEN, TOTAL (PSA)       Result Value Ref Range    Prostate Specific Ag 5.68 (*) 0.00 - 4.00 ng/mL       Chrisandra Netters, MD  [facility]     Documentation provided by Annye Asa, medical scribe for Dr. Chrisandra Netters on 04/18/2013    {No Diagnosis Found}

## 2013-12-16 NOTE — Progress Notes (Signed)
[name]  1945-10-28    Assessment:   1. Normal 30-40g prostate on DRE today with mild LUTS, subjectively improved on flomax 0.4mg . Last PVR low.   2. AUR following URI with OTC decongestants, resolved, so episodes since  3. BPH, s/p negative prostate biopsy ~2003  4. PSA 5.99ng/ml in 08/2012, down to 4.73 in 03/2013 and now back up to 5.63ng/ml. I recommended repeat prostate biopsy however he would like continued surveillance. Will repeat in 25mo  5. Microhematuria on dipstick, no RBCs or WBCs on microscopy. UA negative today  6. s/sx of hypogonadism - low energy, some daytime fatigue, decreased libido, weak erections - not particularly bothered. Discussed evaluation with TT however he would not be inclined for TRT therefore will hold off at this time unless symptoms worsen    Plan:   1. Continue flomax 0.4mg  per day  2. F/U in 75mo with repeat PSA    Chief Complaint   Patient presents with   ??? (LUTS) Lower Urinary Tract Symptoms     pt in for his 8 months follow up     History of Present Illness:  [name] is a 68 y.o. white male who returns today in follow up for BPH with LUTS. Patient has history of elevated PSA, s/p negative prostate biopsy ~2003 per pt report. Patient has history of urinary retention requiring catheterization secondary to decongestant with URI in 2013. No episodes since. He was started on flomax 0.4mg  which he continues. He notes benefit from alpha blocker including decreased nocturia, stronger stream, less frequency, intermittency/urgency. He denies gross hematuria, dysuria or UTIs.     AUA Assessment Score:  AUA Score: 6;    AUA Bother Rating: Pleased    PSA /TESTOSTERONE - [facility] Latest Ref Rng 12/08/2013 04/04/2013   PSA 0.00 - 4.00 ng/mL 5.68 (H)    PSA 0.00 - 4.00 ng/mL  4.73 (A)     PSA /TESTOSTERONE - [facility] Latest Ref Rng 08/20/2012   PSA 0.00 - 4.00 ng/mL    PSA 0.00 - 4.00 ng/mL 5.99 (A)     Review of Systems  Constitutional: Fever: No  Skin: Rash: No  HEENT: Hearing  difficulty: No  Eyes: Blurred vision: No  Cardiovascular: Chest pain: No  Respiratory: Shortness of breath: No  Gastrointestinal: Nausea/vomiting: No  Musculoskeletal: Back pain: No  Neurological: Weakness: No  Psychological: Memory loss: No  Comments/additional findings:   All other systems reviewed and are negative    Past Medical History   Diagnosis Date   ??? UTI (lower urinary tract infection)    ??? Urinary retention        History reviewed. No pertinent past surgical history.    History   Substance Use Topics   ??? Smoking status: Never Smoker    ??? Smokeless tobacco: Not on file   ??? Alcohol Use: No       No Known Allergies    Family History   Problem Relation Age of Onset   ??? Diabetes Mother    ??? Hypertension Mother    ??? Diabetes Sister    ??? Hypertension Sister        Current Outpatient Prescriptions   Medication Sig Dispense Refill   ??? tamsulosin (FLOMAX) 0.4 mg capsule Take 1 Cap by mouth daily (after dinner).  90 Cap  3   ??? oseltamivir (TAMIFLU) 75 mg capsule Take  by mouth two (2) times a day.       ??? guaiFENesin-codeine (CHERATUSSIN AC)  10-100 mg/5 mL solution Take 5 mL by mouth three (3) times daily as needed for Cough.             Physical Exam:  BP 120/70    Ht 5\' 8"  (1.727 m)    Wt 204 lb (92.534 kg)    BMI 31.03 kg/m2     Constitutional: WDWN, Pleasant and appropriate affect, no acute distress.    GU Male:    DRE: Perineum normal to visual inspection, no erythema or irritation, Sphincter with good tone, Rectum with no hemorrhoids, fissures or masses.  Prostate is 30-40g, benign, smooth, no nodules  Skin: No jaundice.    Neuro/Psych:  Alert and oriented x 3. Affect appropriate.       Urinalysis shows:   Results for orders placed in visit on 12/16/13   AMB POC URINALYSIS DIP STICK AUTO W/O MICRO       Result Value Ref Range    Color (UA POC) Yellow      Clarity (UA POC) Clear      Glucose (UA POC) 1+  Negative    Bilirubin (UA POC) Negative  Negative    Ketones (UA POC) 1+  Negative    Specific gravity (UA  POC) 1.030  1.001 - 1.035    Blood (UA POC) Negative  Negative    pH (UA POC) 5.5  4.6 - 8.0    Protein (UA POC) Negative  Negative mg/dL    Urobilinogen (UA POC) 0.2 mg/dL  0.2 - 1    Nitrites (UA POC) Negative  Negative    Leukocyte esterase (UA POC) Negative  Negative       Chrisandra Netters, MD  [facility]         ICD-9-CM    1. Enlarged prostate with lower urinary tract symptoms (LUTS) 600.01 AMB POC URINALYSIS DIP STICK AUTO W/O MICRO

## 2014-05-11 LAB — TSH: TSH: 2.31 (ref 0.27–4.20)

## 2014-06-08 LAB — AMB POC URINALYSIS DIP STICK AUTO W/O MICRO
Bilirubin (UA POC): NEGATIVE
Glucose (UA POC): NEGATIVE
Ketones (UA POC): NEGATIVE
Nitrites (UA POC): NEGATIVE
Protein (UA POC): NEGATIVE mg/dL
Specific gravity (UA POC): 1.02 (ref 1.001–1.035)
Urobilinogen (UA POC): 0.2 (ref 0.2–1)
pH (UA POC): 5.5 (ref 4.6–8.0)

## 2014-06-08 LAB — AMB POC PVR, MEAS,POST-VOID RES,US,NON-IMAGING: PVR: 111 cc

## 2014-06-08 MED ORDER — CIPROFLOXACIN 500 MG TAB
500 mg | ORAL_TABLET | Freq: Two times a day (BID) | ORAL | Status: DC
Start: 2014-06-08 — End: 2014-08-07

## 2014-06-08 NOTE — Progress Notes (Signed)
ASSESSMENT:     ICD-9-CM ICD-10-CM    1. Enlarged prostate with lower urinary tract symptoms (LUTS) 600.01 N40.1 metFORMIN (GLUCOPHAGE) 500 mg tablet      AMB POC URINALYSIS DIP STICK AUTO W/O MICRO      URINE C&S      AMB POC PVR, MEAS,POST-VOID RES,US,NON-IMAGING   2. UTI (lower urinary tract infection) 599.0 N39.0 metFORMIN (GLUCOPHAGE) 500 mg tablet      AMB POC URINALYSIS DIP STICK AUTO W/O MICRO      URINE C&S      AMB POC PVR, MEAS,POST-VOID RES,US,NON-IMAGING   3. Urinary retention 788.20 R33.9 metFORMIN (GLUCOPHAGE) 500 mg tablet      AMB POC URINALYSIS DIP STICK AUTO W/O MICRO      URINE C&S      AMB POC PVR, MEAS,POST-VOID RES,US,NON-IMAGING                PLAN:    Urine culture ordered.  We will notify patient of results and treat accordingly.    Cipro 500mg  PO BID x7 days ordered, will change if needed           Urinary Tract Infection     HISTORY OF PRESENT ILLNESS:  [name] is a 68 y.o. male who presents today for initial evaluation of urinary tract infection.  [name] notes symptoms of urgency, frequency and fever.   Urinary frequency occurs about every 1 Hour and nocturia  five times a night. This has been ongoing for about 4 day(s)    No new back pain    The voided amount is small     Urine odor: NO  Flank pain: NO  Decreased output: YES  Difficulty voiding: NO  Burning with urination: YES  Incontinence:  YES  Split Stream: YES  Any History of Stones:  NO       Patient has had recurrent UTI's:  YES       Past Medical History   Diagnosis Date   ??? UTI (lower urinary tract infection)    ??? Urinary retention        History reviewed. No pertinent past surgical history.    History   Substance Use Topics   ??? Smoking status: Never Smoker    ??? Smokeless tobacco: Not on file   ??? Alcohol Use: No       No Known Allergies    Family History   Problem Relation Age of Onset   ??? Diabetes Mother    ??? Hypertension Mother    ??? Diabetes Sister    ??? Hypertension Sister         Current Outpatient Prescriptions   Medication Sig Dispense Refill   ??? metFORMIN (GLUCOPHAGE) 500 mg tablet Take  by mouth two (2) times daily (with meals).     ??? tamsulosin (FLOMAX) 0.4 mg capsule Take 1 Cap by mouth daily (after dinner). 90 Cap 3       Review of Systems  Constitutional: Fever: No;  Skin: Rash: No;  HEENT: Hearing difficulty: No;  Eyes: Blurred vision: No;  Cardiovascular: Chest pain: No;  Respiratory: Shortness of breath: No;  Gastrointestinal: Nausea/vomiting: No;  Musculoskeletal: Back pain: No;  Neurological: Weakness: No;  Psychological: Memory loss: No;  Comments/additional findings:       PHYSICAL EXAMINATION:     BP 132/76 mmHg   Ht 5\' 8"  (1.727 m)   Wt 204 lb (92.534 kg)   BMI 31.03 kg/m2  Constitutional: Well developed, well-nourished male  in no acute distress.   CV:  No peripheral swelling noted  Respiratory: No respiratory distress or difficulties  Abdomen:  Soft and nontender. No masses. No hepatosplenomegaly.   GU:  deferred  Skin:  Normal color. No evidence of jaundice.     Neuro/Psych:  Patient with appropriate affect.  Alert and oriented.    Lymphatic:   No enlargement of supraclavicular lymph nodes.      REVIEW OF LABS AND IMAGING:      Results for orders placed or performed in visit on 06/08/14   AMB POC URINALYSIS DIP STICK AUTO W/O MICRO   Result Value Ref Range    Color (UA POC) Yellow     Clarity (UA POC) Clear     Glucose (UA POC) Negative Negative    Bilirubin (UA POC) Negative Negative    Ketones (UA POC) Negative Negative    Specific gravity (UA POC) 1.020 1.001 - 1.035    Blood (UA POC) Trace Negative    pH (UA POC) 5.5 4.6 - 8.0    Protein (UA POC) Negative Negative mg/dL    Urobilinogen (UA POC) 0.2 mg/dL 0.2 - 1    Nitrites (UA POC) Negative Negative    Leukocyte esterase (UA POC) 1+ Negative           Darius Diekman Hughes Better, MD on 06/08/2014

## 2014-06-10 LAB — URINE C&S

## 2014-06-12 LAB — AMB POC URINALYSIS DIP STICK AUTO W/O MICRO
Bilirubin (UA POC): NEGATIVE
Blood (UA POC): NEGATIVE
Ketones (UA POC): NEGATIVE
Nitrites (UA POC): NEGATIVE
Protein (UA POC): NEGATIVE mg/dL
Specific gravity (UA POC): 1.025 (ref 1.001–1.035)
Urobilinogen (UA POC): 0.2 (ref 0.2–1)
pH (UA POC): 5.5 (ref 4.6–8.0)

## 2014-06-12 LAB — PROSTATE SPECIFIC ANTIGEN, TOTAL (PSA): Prostate Specific Ag: 12.14 ng/mL — ABNORMAL HIGH (ref 0.00–4.00)

## 2014-06-12 NOTE — Progress Notes (Signed)
[name]  05-Jun-1946    Assessment:   1. Normal 30-40g prostate on prior DRE with mild LUTS, subjectively improved on flomax 0.4mg . Last PVR low.   2. AUR following URI with OTC decongestants, resolved, no episodes since  3. BPH, s/p negative prostate biopsy ~2003  4. Elevated PSA - now with increase to 12.14ng/ml on 06/12/14 from 5.68ng/ml in 12/2013. Unclear if increase related to #5. Will repeat PSA in 6 weeks. If remains elevated, would favor further evaluation with prostate biopsy to r/o malignancy.   5. Recent UTI symptoms including urinary frequency, urgency, fever although negative urine culture. Symptoms have since resolved. UA today with trace leuk. Will repeat urine culture.   6. S/sx of hypogonadism - low energy, some daytime fatigue, decreased libido, weak erections - not particularly bothered. Discussed evaluation with TT however he would not be inclined for TRT therefore will hold off at this time unless symptoms worsen    Plan:   1. PSA drawn today and resulted. Reviewed with the patient.   2. Continue flomax 0.4mg  daily   3. D/c Cipro  4. F/u in 6 weeks with same day PSA    Chief Complaint   Patient presents with   ??? Labs     Same day PSA   ??? (LUTS) Lower Urinary Tract Symptoms     History of Present Illness:  [name] is a 68 y.o. white male who returns today in follow up elevated PSA. He is s/p negative prostate biopsy ~2003 per pt report. PSA with significant increase to 12.14ng/ml on 06/12/14 from prior PSA of 5.68ng/ml in 12/2013. He had previously declined repeat prostate biopsy. Patient presented to the office on 06/08/14 for UTI symptoms including urinary frequency, urgency and was evaluated by Dr. Harvel Quale. UA was with trace blood, 1+ leuk. Urine culture was negative. He is currently on Cipro 500mg . Symptoms have since resolved. Patient has history of urinary retention requiring catheterization secondary to decongestant with URI in  2013. No episodes since. He was started on flomax 0.4mg , which he continues on. He notes benefit from alpha blocker including decreased nocturia, stronger stream, less frequency, intermittency/urgency. He denies gross hematuria, dysuria or UTIs.     AUA Assessment Score:  AUA Score: 12;    AUA Bother Rating: Mostly satisified    PSA /TESTOSTERONE - [facility] PSA PSA   Latest Ref Rng 0.00 - 4.00 ng/mL 0.00 - 4.00 ng/mL   06/12/2014 12.14 (H)    12/08/2013 5.68 (H)    04/04/2013  4.73 (A)   08/20/2012  5.99 (A)       Review of Systems  Constitutional: Fever: No;  Skin: Rash: No;  HEENT: Hearing difficulty: No;  Eyes: Blurred vision: No;  Cardiovascular: Chest pain: No;  Respiratory: Shortness of breath: No;  Gastrointestinal: Nausea/vomiting: No;  Musculoskeletal: Back pain: No;  Neurological: Weakness: No;  Psychological: Memory loss: No;  Comments/additional findings:   All other systems reviewed and are negative    Past Medical History   Diagnosis Date   ??? UTI (lower urinary tract infection)    ??? Urinary retention    ??? Hypertrophy of prostate with urinary obstruction and other lower urinary tract symptoms (LUTS)        History reviewed. No pertinent past surgical history.    History   Substance Use Topics   ??? Smoking status: Never Smoker    ??? Smokeless tobacco: Not on file   ??? Alcohol Use: No  No Known Allergies    Family History   Problem Relation Age of Onset   ??? Diabetes Mother    ??? Hypertension Mother    ??? Diabetes Sister    ??? Hypertension Sister        Current Outpatient Prescriptions   Medication Sig Dispense Refill   ??? metFORMIN (GLUCOPHAGE) 500 mg tablet Take  by mouth two (2) times daily (with meals).     ??? ciprofloxacin HCl (CIPRO) 500 mg tablet Take 1 Tab by mouth two (2) times a day. 14 Tab 0   ??? tamsulosin (FLOMAX) 0.4 mg capsule Take 1 Cap by mouth daily (after dinner). 90 Cap 3     Physical Exam:  BP 118/78 mmHg   Ht 5\' 8"  (1.727 m)   Wt 204 lb (92.534 kg)   BMI 31.03 kg/m2   Constitutional: WDWN, Pleasant and appropriate affect, No acute distress.    Neuro/Psych:  Alert and oriented x 3. Affect appropriate.       UA:  Results for orders placed or performed in visit on 06/12/14   PROSTATE SPECIFIC ANTIGEN, TOTAL (PSA)   Result Value Ref Range    Prostate Specific Ag 12.14 (H) 0.00 - 4.00 ng/mL   AMB POC URINALYSIS DIP STICK AUTO W/O MICRO   Result Value Ref Range    Color (UA POC) Yellow     Clarity (UA POC) Clear     Glucose (UA POC) 1+ Negative    Bilirubin (UA POC) Negative Negative    Ketones (UA POC) Negative Negative    Specific gravity (UA POC) 1.025 1.001 - 1.035    Blood (UA POC) Negative Negative    pH (UA POC) 5.5 4.6 - 8.0    Protein (UA POC) Negative Negative mg/dL    Urobilinogen (UA POC) 0.2 mg/dL 0.2 - 1    Nitrites (UA POC) Negative Negative    Leukocyte esterase (UA POC) Trace Negative       Chrisandra Netters, MD  [facility] Documentation is provided with the assistance of Raechel Chute, medical scribe for Eldridge Dace, MD on 06/12/2014.         ICD-9-CM ICD-10-CM    1. Elevated PSA 790.93 R97.2    2. Enlarged prostate with lower urinary tract symptoms (LUTS) 600.01 N40.1 PROSTATE SPECIFIC ANTIGEN, TOTAL (PSA)      PR COLLECTION VENOUS BLOOD,VENIPUNCTURE      AMB POC URINALYSIS DIP STICK AUTO W/O MICRO      URINE C&S

## 2014-06-12 NOTE — Progress Notes (Signed)
[name] presents today for lab draw per Dr. Kara Pacer order.    Patient will be notified by the physician with lab results.    Same day PSA obtained via venipuncture without any difficulty.      Orders Placed This Encounter   ??? PROSTATE SPECIFIC ANTIGEN, TOTAL (PSA)   ??? PR COLLECTION VENOUS BLOOD,VENIPUNCTURE       [name] on 06/12/2014

## 2014-06-16 LAB — URINE C&S

## 2014-06-29 MED ORDER — TAMSULOSIN SR 0.4 MG 24 HR CAP
0.4 mg | ORAL_CAPSULE | Freq: Every day | ORAL | Status: DC
Start: 2014-06-29 — End: 2016-09-12

## 2014-06-29 NOTE — Telephone Encounter (Signed)
Patient notified prescription taken care of.      [name]

## 2014-06-29 NOTE — Telephone Encounter (Signed)
Patient is requesting refill of Flomax, patient states he was here for a 6 month follow-up and forgot to request this. Please contact patient if there is any issues.     Pharmacy confirmed

## 2014-07-14 LAB — CBC WITH AUTOMATED DIFF
BASOPHILS: 0.8 % (ref 0–3)
EOSINOPHILS: 2.8 % (ref 0–5)
HCT: 37.5 % (ref 37.0–50.0)
HGB: 13 gm/dl (ref 12.4–17.2)
IMMATURE GRANULOCYTES: 0.3 % (ref 0.0–3.0)
LYMPHOCYTES: 30.9 % (ref 28–48)
MCH: 31.5 pg (ref 23.0–34.6)
MCHC: 34.7 gm/dl (ref 30.0–36.0)
MCV: 90.8 fL (ref 80.0–98.0)
MONOCYTES: 6.6 % (ref 1–13)
MPV: 10.4 fL — ABNORMAL HIGH (ref 6.0–10.0)
NEUTROPHILS: 58.6 % (ref 34–64)
NRBC: 0 (ref 0–0)
PLATELET: 177 10*3/uL (ref 140–450)
RBC: 4.13 M/uL (ref 3.80–5.70)
RDW-SD: 40.9 (ref 35.1–43.9)
WBC: 6.4 10*3/uL (ref 4.0–11.0)

## 2014-07-14 LAB — POC CHEM8
BUN: 16 mg/dl (ref 7–25)
CALCIUM,IONIZED: 4.8 mg/dL (ref 4.40–5.40)
CO2, TOTAL: 24 mmol/L (ref 21–32)
Chloride: 101 mEq/L (ref 98–107)
Creatinine: 0.6 mg/dl (ref 0.6–1.3)
Glucose: 110 mg/dL — ABNORMAL HIGH (ref 74–106)
HCT: 38 % — ABNORMAL LOW (ref 40–54)
HGB: 12.9 gm/dl (ref 12.4–17.2)
Potassium: 4.3 mEq/L (ref 3.5–4.9)
Sodium: 138 mEq/L (ref 136–145)

## 2014-07-14 LAB — MAGNESIUM: Magnesium: 1.6 mg/dl — ABNORMAL LOW (ref 1.8–2.4)

## 2014-07-14 LAB — TROPONIN I
Troponin-I: <0.015 ng/ml (ref 0.00–0.09)
Troponin-I: <0.015 ng/ml (ref 0.00–0.09)

## 2014-07-14 LAB — METABOLIC PANEL, BASIC
BUN: 17 mg/dl (ref 7–25)
CO2: 25 mEq/L (ref 21–32)
Calcium: 8.4 mg/dl — ABNORMAL LOW (ref 8.5–10.1)
Chloride: 106 mEq/L (ref 98–107)
Creatinine: 0.9 mg/dl (ref 0.6–1.3)
GFR est AA: >60
GFR est non-AA: >60
Glucose: 102 mg/dl (ref 74–106)
Potassium: 4.2 mEq/L (ref 3.5–5.1)
Sodium: 138 mEq/L (ref 136–145)

## 2014-07-14 LAB — CKMB PROFILE
CK - MB: 1.4 ng/ml (ref 0.0–3.6)
CK-MB Index: 2.1 % (ref 0.0–4.9)
CK: 67 U/L (ref 39–308)

## 2014-07-14 LAB — GLUCOSE, POC: Glucose (POC): 111 mg/dL — ABNORMAL HIGH (ref 65–105)

## 2014-07-14 LAB — POC TROPONIN: Troponin-I: 0 ng/ml (ref 0.00–0.07)

## 2014-07-14 NOTE — H&P (Addendum)
[facility]  History and Physical  NAME:  [name], [name]  SEX:   M  ADMIT: 07/14/2014  DOB:Dec 26, 1945  MR#    [id number]  ROOM:  2226  ACCT#  [id number]    I hereby certify this patient for admission based upon medical necessity as  noted below:    <    CHIEF COMPLAINT:  This is a 68 year old male with no prior history of any cardiac problems, who  comes to the hospital with palpitations, lightheadedness, chest discomfort  that started last night.  He went to see his primary care physician who did a  EKG and found that he had atrial flutter.  The patient stated he has been  feeling fine all day yesterday, did everything normally, but developed this  chest pain and palpitations at about late night before he went to sleep.  He  thought he was is going to pass out, but never did.  He was restless all night  because of the same.  Never had any history of no new medications, no drug  use.  Does have history of borderline diabetes, hypertension for the same.  He  did not pass out.  Does not have any leg swelling.  No other change, no  orthopnea, no PND.  The patient was sent to the emergency room by the primary  care physician via EMS due to concern for cardiac ischemia.    PAST MEDICAL HISTORY:  Borderline diabetes.  He said he had been on metformin for the same and was  taken off after his A1c was 6.2.    PAST SURGICAL HISTORY:  Cyst removal.    FAMILY HISTORY:  Diabetes in mother, diabetes in the sister, stroke in the sister age of 41.  She never took care of her and stopped taking her diabetes and hypertension  medications.  Father, mother deceased.  Sister alive.  Daughter alive.    SOCIAL HISTORY:  Never a smoker.  Does not drink alcohol or do drugs.    ALLERGIES:  NO KNOWN DRUG ALLERGIES.    REVIEW OF SYSTEMS:  CONSTITUTIONAL:  Negative for fever and chills.  HEENT:  Negative for congestion, nosebleeds, sore throat.  EYES:  Negative for blurred vision, redness.   RESPIRATORY:  Negative for cough, shortness of breath.  CARDIOVASCULAR:  Positive for chest pain, palpitations.  Negative for leg  swelling.  GASTROINTESTINAL:  Negative for heartburn, constipation, blood in the stool.  GENITOURINARY:  Negative for urgency, frequency, flank pain.  MUSCULOSKELETAL:  Negative for myalgias and neck pain.  SKIN:  Negative for itching and rash.  NEUROLOGICAL:   Positive for some dizziness, no sensory.  No focal weakness or  loss of consciousness.  ENDOCRINE:  Negative was polydipsia.  HEMATOLOGICAL:  Negative for bruising.  He does say that he used to bruise and  bleed very easily when he was on aspirin.    LABORATORY:  Troponin less than 0.015.  Sodium 130, potassium 4.2, chloride 106,  bicarbonate 25, BUN 17, creatinine 0.8, calcium 8.4.  White count 6.4,  hemoglobin 13 and hematocrit 37.5, platelets 177, glucose 110.      Chest x-ray normal study.    PHYSICAL EXAMINATION:  GENERAL:  Showed normal appearance, pleasant, in no apparent distress.  HEENT:  Normocephalic, atraumatic.  NECK:  No JVD.  CARDIOVASCULAR:  S1 and S2 heard, no murmurs.  The patient's rate is regular.  GASTROINTESTINAL:  Soft.  Bowel sounds present, no pulsating masses or signs  of peritonitis.  RESPIRATORY:  Bilaterally equal air entry, no wheezes.  EXTREMITIES:  No clubbing, no edema.  SKIN:  No ecchymosis.  PSYCHIATRIC:  Appropriate mood and affect.  MUSCULOSKELETAL:  Joints normal.  Muscle strength normal.  NEUROLOGIC:  Grossly intact.    HOME MEDICATIONS:  Include Flomax 0.4 every day, metformin 500 twice a day, Prevacid 10 mg every  day.    ASSESSMENT AND PLAN:  1.  Atrial flutter with rapid ventricular response, looks like atrial flutter  with 2:1 block, was started on Cardizem in the emergency room.  Rate has  gotten better, intermittently it is irregular for his atrial fibrillation.  No  history of arrhythmia.  No history of coronary disease.  Might need ischemia   evaluation also.  Cardiology will be evaluating the patient.  Might need to be  on anticoagulation, rate control with Cardizem.  2.  Diabetes, borderline.  Has been on metformin.  Keep on sliding scale  insulin.  3.  Hypertension, likely will need to go on to a rate controlling agent which  will also help with the blood pressure.  4.  Benign prostate hypertrophy.  This patient has been on Flomax for the  same.  Will continue that.  5.  Deep venous thrombosis prophylaxis.  If he was on anticoagulation as per  cardiology, then will continue that and SCDs.      Plan discussed with the ID physician, discussed with the patient and the  patient's daughter at bedside.  We will check the cardiac enzymes.  Cardiology  will probably decide if he will be a candidate for cardioversion considering  that the time line is likely less than 24 hours, concerning the symptoms  started at 9.      Total time spent more than 50% with examination of the patient, reviewing the  patient, discussion with patient in detail is 55 minutes.      ___________________  Charlaine Dalton MD  Dictated By: .   BB  D:07/14/2014 16:23:13  T: 07/14/2014 17:25:50  [id number]  Electronically Authenticated by:  Charlaine Dalton, MD On 07/15/2014 03:37 PM EDT

## 2014-07-14 NOTE — ED Provider Notes (Addendum)
[facility]  EMERGENCY DEPARTMENT TREATMENT REPORT  NAME:  [name]  SEX:   M  ADMIT: 07/14/2014  DOB:   [birth year]  MR#    [id number]  ROOM:  2226  TIME DICTATED: 05 03 PM  ACCT#  [id number]        I hereby certify this patient for admission based upon medical necessity as  noted below.     TIME  11:00 a.m.    CHIEF COMPLAINT:  Chest pressure.    HISTORY OF PRESENT ILLNESS:  The patient is a 68 year old male brought here by medic from his primary care  doctor, Dr. Maretta Bees office by ambulance, when he complained there of having  chest pressure.  He was given aspirin and nitroglycerin by EMS.  He states  that the chest pressure actually began yesterday at 8 p.m. and has been  intermittent since onset.  He has had some associated dizziness.  No  palpitations.  No nausea, vomiting or diaphoresis.  No history of similar  symptoms.  The patient has not recently been ill.  He has not had any  medication change.  No recent travel or surgery.  He is not short of breath.  He denies any swelling in lower extremities.  He states the symptoms were  improved by the aspirin and nitroglycerin.    REVIEW OF SYSTEMS:    CONSTITUTIONAL:  No fever, chills, or weight loss.  ENT:  No sore throat, runny nose, or other URI symptoms.   HEMATOLOGIC:  No hemoptysis.  RESPIRATORY:  No cough, shortness of breath or wheezing.  CARDIOVASCULAR:  As above.  GASTROINTESTINAL:  No vomiting, diarrhea, or abdominal pain.   GENITOURINARY:  No dysuria, frequency, or urgency.  MUSCULOSKELETAL:  No joint pain or swelling.   INTEGUMENTARY:  No rashes.  NEUROLOGICAL:  No headaches, sensory or motor symptoms.     PAST MEDICAL HISTORY:  Hyperlipidemia, type 2 diabetes.    PAST SURGICAL HISTORY:  Pneumothorax.    PSYCHIATRIC HISTORY:  None.    SOCIAL HISTORY:  The patient is a previous cigarette smoker.  Denies alcohol and drug use.    CURRENT MEDICATIONS:  Metformin, cholesterol medicine.    ALLERGIES:  NO KNOWN DRUG ALLERGIES.     PHYSICAL EXAMINATION:  VITAL SIGNS:  Blood pressure  124/87, pulse 144, respirations 16, temperature  is 97.9, O2 is 96% on room air, pain is 1.  GENERAL APPEARANCE:  Patient appears well developed and well nourished.  Appearance and behavior are age and situation appropriate.  HEENT:  Eyes:  Conjunctivae clear, lids normal.  Mouth/Throat:  Surfaces of  the pharynx, palate, and tongue are pink, moist, and without lesions.  Teeth  and gums unremarkable.  RESPIRATORY:  Clear and equal breath sounds.  No respiratory distress,  tachypnea, or accessory muscle use.    CARDIOVASCULAR:  Heart with increased rate, appears to be regular, no murmurs  appreciated.  VASCULAR:  Calves soft and nontender.  GASTROINTESTINAL:  Abdomen soft, nontender.  SKIN:  Warm and dry.  NEUROLOGIC:  Motor strength equal and symmetric.  No facial asymmetry or  dysarthria.  PSYCHIATRIC:  Judgment appears appropriate.    INITIAL ASSESSMENT AND MANAGEMENT PLAN:  IMPRESSION/PLAN:   Patient with chest pain.  Acute ischemic coronary disease  must be considered first, and the patient protected against the consequences  of same, while other etiologies (including infectious, pulmonary,  gastrointestinal, and musculoskeletal) are considered.    The patient has already had aspirin and nitroglycerin.  We will not repeat  those medications here.  Will have the patient on a monitor, obtain troponin,  chest x-ray, EKG, basic labs.    CONTINUATION BY Eulis Manly, MD:    EMERGENCY DEPARTMENT COURSE AND MEDICAL DECISION MAKING:    I saw the patient with physician assistant Arnette Schaumann.  I also examined the  patient dependently.    The patient tells me that he began feeling lightheaded and had chest pressure  last night around 8:00 p.m., went to his primary care doctor today, had a 12  lead EKG that showed tachycardia  and was referred to the emergency room for  chest pain and tachycardia.  In my evaluation the patient's heart rate is   right around 150.  It seems to be varying between 147 and 153 and I suspect  that this is atrial flutter.  Today's 12-lead EKG shows tachycardia with a  rate of 144.  It is read by the computer as sinus tachycardia.  I am  suspicious of atrial flutter and this may be causing the patient's symptoms.  I do not see any obvious ST elevation, need to consider ischemia, electrolyte  abnormality is likewise considered.  Considered hyperthyroidism.  Troponin is  negative; basic metabolic panel is normal, white blood cell count is normal as  are hemoglobin, hematocrit and platelet count.  Differential is no left shift.  Chest x-ray is read as normal.  Point of care i-STAT chem-8 was done on  arrival.  Trying to sort out this tachycardia, I gave the patient 6 mg of  adenosine and when the pause was finally hit there was clear underlying  flutter waves. This confirms that this is atrial flutter.  So I have ordered a  diltiazem bolus of 25 mg and a drip at 10 mg per hour.  The heart rate is  nicely controlled in the 70s to 80s.  His blood pressure was 99/77 just before  giving the adeno card.  I have given him a liter of normal saline and his  blood pressures seemed to have held in the low 100s/50s to 60s.    DIAGNOSES:  1.  Chest pain.   2.  Atrial flutter with rapid ventricular rate.      PLAN:   The patient is being admitted by [facility] hospitalist as is discussed with Dr.  Winona Legato.   I have also consulted cardiology, Dr. Donald Pore, III.  The  patient's initial blood pressure was normal at 136/95, it dipped to 99/77 and  then with the fluids and better rate control it has maintained in the low   100s.    Critical care time, excluding procedures, but including direct patient care,  reviewing medical records, evaluating results of diagnostic testing,  discussions with family members and consulting with physicians:  35 minutes.      ___________________  Serita Sheller MD  Dictated By: Lalla Brothers. Brigitte Pulse, PA-C     My signature above authenticates this document and my orders, the final  diagnosis (es), discharge prescription (s), and instructions in the [facility] record.  Nursing notes have been reviewed by the physician/mid-level provider.    If you have any questions please contact [phone].    Mamou  D:07/14/2014 17:03:51  T: 07/14/2014 18:11:58  [id number]  Electronically Authenticated by:  Serita Sheller, M.D. On 07/15/2014 01:41 PM EDT

## 2014-07-15 LAB — CBC WITH AUTOMATED DIFF
BASOPHILS: 0.6 % (ref 0–3)
BASOPHILS: 0.6 % (ref 0–3)
EOSINOPHILS: 3.3 % (ref 0–5)
EOSINOPHILS: 3.9 % (ref 0–5)
HCT: 33.1 % — ABNORMAL LOW (ref 37.0–50.0)
HCT: 32 % — ABNORMAL LOW (ref 37.0–50.0)
HGB: 11.7 gm/dl — ABNORMAL LOW (ref 12.4–17.2)
HGB: 11.4 gm/dl — ABNORMAL LOW (ref 12.4–17.2)
IMMATURE GRANULOCYTES: 0.4 % (ref 0.0–3.0)
IMMATURE GRANULOCYTES: 0.3 % (ref 0.0–3.0)
LYMPHOCYTES: 38.8 % (ref 28–48)
LYMPHOCYTES: 39.6 % (ref 28–48)
MCH: 31.6 pg (ref 23.0–34.6)
MCH: 31.8 pg (ref 23.0–34.6)
MCHC: 35.3 gm/dl (ref 30.0–36.0)
MCHC: 35.6 gm/dl (ref 30.0–36.0)
MCV: 89.5 fL (ref 80.0–98.0)
MCV: 89.1 fL (ref 80.0–98.0)
MONOCYTES: 8.2 % (ref 1–13)
MONOCYTES: 9.2 % (ref 1–13)
MPV: 10.4 fL — ABNORMAL HIGH (ref 6.0–10.0)
MPV: 9.8 fL (ref 6.0–10.0)
NEUTROPHILS: 48.7 % (ref 34–64)
NEUTROPHILS: 46.4 % (ref 34–64)
NRBC: 0 (ref 0–0)
NRBC: 0 (ref 0–0)
PLATELET: 187 10*3/uL (ref 140–450)
PLATELET: 171 10*3/uL (ref 140–450)
RBC: 3.7 M/uL — ABNORMAL LOW (ref 3.80–5.70)
RBC: 3.59 M/uL — ABNORMAL LOW (ref 3.80–5.70)
RDW-SD: 40.6 (ref 35.1–43.9)
RDW-SD: 41.5 (ref 35.1–43.9)
WBC: 9.6 10*3/uL (ref 4.0–11.0)
WBC: 9.2 10*3/uL (ref 4.0–11.0)

## 2014-07-15 LAB — METABOLIC PANEL, BASIC
BUN: 22 mg/dl (ref 7–25)
CO2: 28 mEq/L (ref 21–32)
Calcium: 8.2 mg/dl — ABNORMAL LOW (ref 8.5–10.1)
Chloride: 106 mEq/L (ref 98–107)
Creatinine: 1 mg/dl (ref 0.6–1.3)
GFR est AA: >60
GFR est non-AA: >60
Glucose: 146 mg/dl — ABNORMAL HIGH (ref 74–106)
Potassium: 3.6 mEq/L (ref 3.5–5.1)
Sodium: 140 mEq/L (ref 136–145)

## 2014-07-15 LAB — PTT, CRRT PROTOCOL (PTT DRIP)
PTT drip: 123.1 seconds — CR (ref 70.0–105.0)
PTT drip: 87 seconds (ref 70.0–105.0)

## 2014-07-15 LAB — CKMB PROFILE
CK - MB: 1.1 ng/ml (ref 0.0–3.6)
CK - MB: 1 ng/ml (ref 0.0–3.6)
CK - MB: 0.8 ng/ml (ref 0.0–3.6)
CK - MB: 0.9 ng/ml (ref 0.0–3.6)
CK-MB Index: 1.9 % (ref 0.0–4.9)
CK-MB Index: 1.9 % (ref 0.0–4.9)
CK-MB Index: 1.6 % (ref 0.0–4.9)
CK-MB Index: 1.6 % (ref 0.0–4.9)
CK: 57 U/L (ref 39–308)
CK: 53 U/L (ref 39–308)
CK: 51 U/L (ref 39–308)
CK: 56 U/L (ref 39–308)

## 2014-07-15 LAB — TROPONIN I
Troponin-I: <0.015 ng/ml (ref 0.00–0.09)
Troponin-I: <0.015 ng/ml (ref 0.00–0.09)
Troponin-I: <0.015 ng/ml (ref 0.00–0.09)
Troponin-I: <0.015 ng/ml (ref 0.00–0.09)

## 2014-07-15 LAB — GLUCOSE, POC
Glucose (POC): 200 mg/dL — ABNORMAL HIGH (ref 65–105)
Glucose (POC): 140 mg/dL — ABNORMAL HIGH (ref 65–105)
Glucose (POC): 187 mg/dL — ABNORMAL HIGH (ref 65–105)
Glucose (POC): 140 mg/dL — ABNORMAL HIGH (ref 65–105)

## 2014-07-16 LAB — CBC WITH AUTOMATED DIFF
BASOPHILS: 0.9 % (ref 0–3)
EOSINOPHILS: 6.7 % — ABNORMAL HIGH (ref 0–5)
HCT: 31.8 % — ABNORMAL LOW (ref 37.0–50.0)
HGB: 11 gm/dl — ABNORMAL LOW (ref 12.4–17.2)
IMMATURE GRANULOCYTES: 0.4 % (ref 0.0–3.0)
LYMPHOCYTES: 43.3 % (ref 28–48)
MCH: 31.6 pg (ref 23.0–34.6)
MCHC: 34.6 gm/dl (ref 30.0–36.0)
MCV: 91.4 fL (ref 80.0–98.0)
MONOCYTES: 10.9 % (ref 1–13)
MPV: 10.8 fL — ABNORMAL HIGH (ref 6.0–10.0)
NEUTROPHILS: 37.8 % (ref 34–64)
NRBC: 0 (ref 0–0)
PLATELET: 149 10*3/uL (ref 140–450)
RBC: 3.48 M/uL — ABNORMAL LOW (ref 3.80–5.70)
RDW-SD: 42.5 (ref 35.1–43.9)
WBC: 5.4 10*3/uL (ref 4.0–11.0)

## 2014-07-16 LAB — CKMB PROFILE
CK - MB: 0.9 ng/ml (ref 0.0–3.6)
CK - MB: 1.3 ng/ml (ref 0.0–3.6)
CK-MB Index: 1.7 % (ref 0.0–4.9)
CK-MB Index: 2.2 % (ref 0.0–4.9)
CK: 54 U/L (ref 39–308)
CK: 58 U/L (ref 39–308)

## 2014-07-16 LAB — GLUCOSE, POC
Glucose (POC): 150 mg/dL — ABNORMAL HIGH (ref 65–105)
Glucose (POC): 135 mg/dL — ABNORMAL HIGH (ref 65–105)
Glucose (POC): 118 mg/dL — ABNORMAL HIGH (ref 65–105)
Glucose (POC): 110 mg/dL — ABNORMAL HIGH (ref 65–105)

## 2014-07-16 LAB — METABOLIC PANEL, BASIC
BUN: 15 mg/dl (ref 7–25)
CO2: 27 mEq/L (ref 21–32)
Calcium: 8.2 mg/dl — ABNORMAL LOW (ref 8.5–10.1)
Chloride: 109 mEq/L — ABNORMAL HIGH (ref 98–107)
Creatinine: 0.8 mg/dl (ref 0.6–1.3)
GFR est AA: >60
GFR est non-AA: >60
Glucose: 124 mg/dl — ABNORMAL HIGH (ref 74–106)
Potassium: 4.3 mEq/L (ref 3.5–5.1)
Sodium: 141 mEq/L (ref 136–145)

## 2014-07-16 LAB — TROPONIN I
Troponin-I: <0.015 ng/ml (ref 0.00–0.09)
Troponin-I: <0.015 ng/ml (ref 0.00–0.09)

## 2014-08-07 ENCOUNTER — Ambulatory Visit: Admit: 2014-08-07 | Discharge: 2014-08-07 | Payer: MEDICARE | Attending: Urology | Primary: Internal Medicine

## 2014-08-07 DIAGNOSIS — R972 Elevated prostate specific antigen [PSA]: Secondary | ICD-10-CM

## 2014-08-07 LAB — AMB POC URINALYSIS DIP STICK AUTO W/O MICRO
Bilirubin (UA POC): NEGATIVE
Glucose (UA POC): NEGATIVE
Ketones (UA POC): NEGATIVE
Leukocyte esterase (UA POC): NEGATIVE
Nitrites (UA POC): NEGATIVE
Protein (UA POC): NEGATIVE mg/dL
Specific gravity (UA POC): 1.03 (ref 1.001–1.035)
Urobilinogen (UA POC): 0.2 (ref 0.2–1)
pH (UA POC): 6 (ref 4.6–8.0)

## 2014-08-07 LAB — PROSTATE SPECIFIC ANTIGEN, TOTAL (PSA): Prostate Specific Ag: 6.14 ng/mL — ABNORMAL HIGH (ref 0.00–4.00)

## 2014-08-07 NOTE — Progress Notes (Signed)
[name]  04-02-1946    Assessment:   1. Elevated PSA, increased to 12.14ng/ml on 06/12/14 from 5.68ng/ml in 12/2013. Unclear if increase was related to recent UTI symptoms including urinary frequency, urgency, fever although negative urine culture. Repeat PSA 6.14ng/ml on 08/07/14, in line with prior PSA trend. Will continue to monitor.   2. BPH, s/p negative prostate biopsy ~2003  3. Normal 30-40g prostate on prior DRE with mild LUTS, subjectively improved on flomax 0.4mg . Last PVR low.   4. AUR following URI with OTC decongestants, resolved, no episodes since  5. H/O UTI/ prostatitis symptoms including urinary frequency, urgency, fever although negative urine culture. Symptoms resolved. UA clear today.   6. S/sx of hypogonadism - low energy, some daytime fatigue, decreased libido, weak erections - not particularly bothered. Discussed evaluation with TT however he would not be inclined for TRT therefore will hold off at this time unless symptoms worsen    Plan:   1. PSA drawn today and resulted. Reviewed with the patient.   2. Continue flomax 0.4mg  daily   3. F/u in 1 year with PSA, DRE     Chief Complaint   Patient presents with   ??? Elevated PSA     pt is here for a 6 week follow up and s/d PSA     History of Present Illness:  [name] is a 68 y.o. white male who returns today in follow up elevated PSA. He is s/p negative prostate biopsy ~2003 per pt report. PSA with significant increase to 12.14ng/ml on 06/12/14 from prior PSA of 5.68ng/ml in 12/2013. He had previously declined repeat prostate biopsy. Patient presented to the office on 06/08/14 for UTI symptoms including urinary frequency, urgency and was evaluated by Dr. Harvel Quale. UA was with trace blood, 1+ leuk. Urine culture was negative. Symptoms resolved. Repeat PSA now in line with prior PSA trend, returned 6.14ng/ml on 10/30. Patient has history of urinary retention requiring catheterization  secondary to decongestant with URI in 2013. No episodes since. He was started on flomax 0.4mg , which he continues on. He notes benefit from alpha blocker including decreased nocturia, stronger stream, less frequency, intermittency/urgency. Nocturia x0-1. He denies gross hematuria, dysuria or UTIs.     AUA Assessment Score:  AUA Score: 5;    AUA Bother Rating: Pleased    PSA /TESTOSTERONE - BSHSI PSA PSA   Latest Ref Rng 0.00 - 4.00 ng/mL 0.00 - 4.00 ng/mL   08/07/2014 6.14    06/12/2014 12.14 (H)    12/08/2013 5.68 (H)    04/04/2013  4.73 (A)   08/20/2012  5.99 (A)       Review of Systems  Constitutional: Fever: No;  Skin: Rash: No;  HEENT: Hearing difficulty: No;  Eyes: Blurred vision: No;  Cardiovascular: Chest pain: No;  Respiratory: Shortness of breath: No;  Gastrointestinal: Nausea/vomiting: No;  Musculoskeletal: Back pain: No;  Neurological: Weakness: Yes;  Psychological: Memory loss: No;  Comments/additional findings:   All other systems reviewed and are negative    Past Medical History   Diagnosis Date   ??? UTI (lower urinary tract infection)    ??? Urinary retention    ??? Hypertrophy of prostate with urinary obstruction and other lower urinary tract symptoms (LUTS)    ??? Elevated prostate specific antigen (PSA)    ??? Enlarged prostate with lower urinary tract symptoms (LUTS)        History reviewed. No pertinent past surgical history.    History   Substance  Use Topics   ??? Smoking status: Never Smoker    ??? Smokeless tobacco: Not on file   ??? Alcohol Use: No       No Known Allergies    Family History   Problem Relation Age of Onset   ??? Diabetes Mother    ??? Hypertension Mother    ??? Diabetes Sister    ??? Hypertension Sister    ??? Stroke Sister        Current Outpatient Prescriptions   Medication Sig Dispense Refill   ??? PROAIR HFA 90 mcg/actuation inhaler      ??? ACCU-CHEK AVIVA PLUS TEST STRP strip      ??? fluticasone (FLONASE) 50 mcg/actuation nasal spray      ??? levofloxacin (LEVAQUIN) 500 mg tablet       ??? lisinopril (PRINIVIL, ZESTRIL) 2.5 mg tablet      ??? metoprolol (LOPRESSOR) 25 mg tablet      ??? pravastatin (PRAVACHOL) 10 mg tablet      ??? XARELTO 20 mg tab tablet      ??? tamsulosin (FLOMAX) 0.4 mg capsule Take 1 Cap by mouth daily (after dinner). 90 Cap 3   ??? metFORMIN (GLUCOPHAGE) 500 mg tablet Take  by mouth two (2) times daily (with meals).       Physical Exam:  BP 124/82 mmHg   Ht 5\' 8"  (1.727 m)   Wt 204 lb (92.534 kg)   BMI 31.03 kg/m2  Constitutional: WDWN, Pleasant and appropriate affect, No acute distress.  .    Neuro/Psych:  Alert and oriented x 3. Affect appropriate.       Results for orders placed or performed in visit on 08/07/14   PROSTATE SPECIFIC ANTIGEN, TOTAL (PSA)   Result Value Ref Range    Prostate Specific Ag 6.14 (H) 0.00 - 4.00 ng/mL   AMB POC URINALYSIS DIP STICK AUTO W/O MICRO   Result Value Ref Range    Color (UA POC) Yellow     Clarity (UA POC) Clear     Glucose (UA POC) Negative Negative    Bilirubin (UA POC) Negative Negative    Ketones (UA POC) Negative Negative    Specific gravity (UA POC) 1.030 1.001 - 1.035    Blood (UA POC) Trace Negative    pH (UA POC) 6.0 4.6 - 8.0    Protein (UA POC) Negative Negative mg/dL    Urobilinogen (UA POC) 0.2 mg/dL 0.2 - 1    Nitrites (UA POC) Negative Negative    Leukocyte esterase (UA POC) Negative Negative       Chrisandra Netters, MD  [facility] Documentation is provided with the assistance of Raechel Chute, medical scribe for Eldridge Dace, MD on 08/07/2014.         ICD-10-CM ICD-9-CM    1. Elevated PSA R97.2 790.93 PROSTATE SPECIFIC ANTIGEN, TOTAL (PSA)      PR COLLECTION VENOUS BLOOD,VENIPUNCTURE

## 2014-08-07 NOTE — Progress Notes (Signed)
Same day PSA and testosterone obtained via venipuncture by Katharina Caper, LPN per Dr. Kara Pacer order. Patient to receive results today in office.

## 2015-05-20 ENCOUNTER — Ambulatory Visit: Admit: 2015-05-20 | Discharge: 2015-05-20 | Attending: Urology | Primary: Internal Medicine

## 2015-05-20 DIAGNOSIS — R972 Elevated prostate specific antigen [PSA]: Secondary | ICD-10-CM

## 2015-05-20 LAB — AMB POC URINALYSIS DIP STICK AUTO W/O MICRO
Blood (UA POC): NEGATIVE
Leukocyte esterase (UA POC): NEGATIVE
Nitrites (UA POC): NEGATIVE
Protein (UA POC): NEGATIVE mg/dL
Specific gravity (UA POC): 1.03 (ref 1.001–1.035)
Urobilinogen (UA POC): 0.2 (ref 0.2–1)
pH (UA POC): 5.5 (ref 4.6–8.0)

## 2015-05-20 LAB — PROSTATE SPECIFIC ANTIGEN, TOTAL (PSA): Prostate Specific Ag: 6.7 ng/mL — ABNORMAL HIGH (ref 0.00–4.00)

## 2015-05-20 NOTE — Progress Notes (Signed)
DEL OVERFELT  22-Jul-1946    Assessment:   1. Elevated PSA, had been as high as 12.14ng/ml on 06/12/14 from 5.68ng/ml in 12/2013. Now 6.7ng/ml. Will need close surveillance. Repeat PSA in 44mo  2. BPH, s/p negative prostate biopsy ~2003. Likely a cause for PSA elevation  3. Normal 30-40g prostate on prior DRE with no LUTS, subjectively improved on intermittent flomax 0.4mg .   4. AUR following URI with OTC decongestants, resolved, no episodes since  5. H/O E. Coli UTI adequately treated. No recurrence since.      Plan:   1. PSA drawn today and resulted. Reviewed with the patient.   2. Continue flomax 0.4mg  daily   3. F/u in 34mo with repeat PSA and % free PSA with Dr. Kandyce Rud one week prior    Chief Complaint   Patient presents with   ??? (LUTS) Lower Urinary Tract Symptoms     pt in for his 1 year follow up with same day psa.     History of Present Illness:  [name] is a 69 y.o. white male who returns today in follow up elevated PSA. He is s/p negative prostate biopsy ~2003 per pt report. PSA with significant increase to 12.14ng/ml on 06/12/14 from prior PSA of 5.68ng/ml in 12/2013. He had previously declined repeat prostate biopsy. Patient presented to the office on 06/08/14 for UTI symptoms including urinary frequency, urgency and was evaluated by Dr. Harvel Quale. UA was with trace blood, 1+ leuk. Urine culture was negative. Symptoms resolved. Repeat PSA now 6.7ng/ml, slightly higher than expected. Patient has history of urinary retention requiring catheterization secondary to decongestant with URI in 2013. No episodes since. He was started on flomax 0.4mg , which he continues on. He notes benefit from alpha blocker including decreased nocturia, stronger stream, less frequency, intermittency/urgency. Nocturia x0-1. He denies gross hematuria, dysuria or UTIs.     AUA Assessment Score:  AUA Score: 4;    AUA Bother Rating: Pleased    PSA /TESTOSTERONE - BSHSI PSA PSA   Latest Ref Rng 0.00 - 4.00 ng/mL 0.00 - 4.00 ng/mL    05/20/2015 6.7    08/07/2014 6.14    06/12/2014 12.14 (H)    12/08/2013 5.68 (H)    04/04/2013  4.73 (A)   08/20/2012  5.99 (A)       Review of Systems  Constitutional: Fever: No  Skin: Rash: No  HEENT: Hearing difficulty: No  Eyes: Blurred vision: No  Cardiovascular: Chest pain: No  Respiratory: Shortness of breath: No  Gastrointestinal: Nausea/vomiting: No  Musculoskeletal: Back pain: No  Neurological: Weakness: No  Psychological: Memory loss: No  Comments/additional findings:   All other systems reviewed and are negative    Past Medical History   Diagnosis Date   ??? Elevated prostate specific antigen (PSA)    ??? Enlarged prostate with lower urinary tract symptoms (LUTS)    ??? Hypertrophy of prostate with urinary obstruction and other lower urinary tract symptoms (LUTS)    ??? Urinary retention    ??? UTI (lower urinary tract infection)        History reviewed. No pertinent past surgical history.    Social History   Substance Use Topics   ??? Smoking status: Never Smoker   ??? Smokeless tobacco: None   ??? Alcohol use No       No Known Allergies    Family History   Problem Relation Age of Onset   ??? Diabetes Mother    ??? Hypertension Mother    ???  Diabetes Sister    ??? Hypertension Sister    ??? Stroke Sister        Current Outpatient Prescriptions   Medication Sig Dispense Refill   ??? PROAIR HFA 90 mcg/actuation inhaler      ??? ACCU-CHEK AVIVA PLUS TEST STRP strip      ??? fluticasone (FLONASE) 50 mcg/actuation nasal spray      ??? levofloxacin (LEVAQUIN) 500 mg tablet      ??? lisinopril (PRINIVIL, ZESTRIL) 2.5 mg tablet      ??? metoprolol (LOPRESSOR) 25 mg tablet      ??? pravastatin (PRAVACHOL) 10 mg tablet      ??? XARELTO 20 mg tab tablet      ??? tamsulosin (FLOMAX) 0.4 mg capsule Take 1 Cap by mouth daily (after dinner). 90 Cap 3   ??? metFORMIN (GLUCOPHAGE) 500 mg tablet Take  by mouth two (2) times daily (with meals).       Physical Exam:  Visit Vitals   ??? BP 120/70   ??? Ht 5\' 8"  (1.727 m)   ??? Wt 204 lb (92.5 kg)   ??? BMI 31.02 kg/m2      Constitutional: WDWN, Pleasant and appropriate affect, No acute distress.  .    DRE: prostate, smooth 40g no nodules  Neuro/Psych:  Alert and oriented x 3. Affect appropriate.       Results for orders placed or performed in visit on 05/20/15   PROSTATE SPECIFIC ANTIGEN, TOTAL (PSA)   Result Value Ref Range    Prostate Specific Ag 6.70 (H) 0.00 - 4.00 ng/mL       Chrisandra Netters, MD  [facility]         ICD-10-CM ICD-9-CM    1. Elevated PSA R97.2 790.93 PROSTATE SPECIFIC ANTIGEN, TOTAL (PSA)      PR COLLECTION VENOUS BLOOD,VENIPUNCTURE      AMB POC URINALYSIS DIP STICK AUTO W/O MICRO   2. Benign prostatic hyperplasia with lower urinary tract symptoms, unspecified morphology N40.1 600.01

## 2015-08-23 MED ORDER — TAMSULOSIN SR 0.4 MG 24 HR CAP
0.4 mg | ORAL_CAPSULE | Freq: Every day | ORAL | 3 refills | Status: DC
Start: 2015-08-23 — End: 2015-11-19

## 2015-08-23 NOTE — Telephone Encounter (Signed)
The patient called to request a medication refill for tamsulosin.  Patient was advised that I would notify the nurse of the request.  He agreed, thanked me and call was ended.  The preferred pharmacy is as listed on file.

## 2015-08-23 NOTE — Telephone Encounter (Signed)
Sent rx for flomax 0.4mg  1 daily #90 with 3 refills. Pt has an appt. With Dr. Kandyce Rud Feb 2017  Florencia Reasons, LPN

## 2015-11-16 ENCOUNTER — Institutional Professional Consult (permissible substitution): Admit: 2015-11-16 | Discharge: 2015-11-17 | Payer: MEDICARE | Primary: Internal Medicine

## 2015-11-16 ENCOUNTER — Encounter: Primary: Internal Medicine

## 2015-11-16 DIAGNOSIS — N401 Enlarged prostate with lower urinary tract symptoms: Secondary | ICD-10-CM

## 2015-11-16 NOTE — Progress Notes (Signed)
[name] presents today for lab draw per Dr. Kandyce Rud order.    Patient will be notified by the physician with lab results.    PSA (Total & Free) obtained via venipuncture without any difficulty.      Orders Placed This Encounter   ??? PSA (TOTAL, REFLEX TO FREE)   ??? PR COLLECTION VENOUS BLOOD,VENIPUNCTURE       Darci Needle

## 2015-11-17 LAB — PSA (FREE)
PSA, % Free: 14 %
PSA, Free: 1.27 ng/mL

## 2015-11-17 LAB — PSA (TOTAL, REFLEX TO FREE): Prostate Specific Ag: 9.04 ng/mL — ABNORMAL HIGH (ref 0.00–4.00)

## 2015-11-17 NOTE — Progress Notes (Signed)
Elevated PSA with low % free. Will discuss at upcoming appt.

## 2015-11-19 ENCOUNTER — Ambulatory Visit: Admit: 2015-11-19 | Discharge: 2015-11-19 | Payer: MEDICARE | Attending: Urology | Primary: Internal Medicine

## 2015-11-19 DIAGNOSIS — N401 Enlarged prostate with lower urinary tract symptoms: Secondary | ICD-10-CM

## 2015-11-19 LAB — AMB POC URINALYSIS DIP STICK AUTO W/O MICRO
Bilirubin (UA POC): NEGATIVE
Blood (UA POC): NEGATIVE
Ketones (UA POC): NEGATIVE
Leukocyte esterase (UA POC): NEGATIVE
Nitrites (UA POC): NEGATIVE
Protein (UA POC): NEGATIVE mg/dL
Specific gravity (UA POC): 1.01 (ref 1.001–1.035)
Urobilinogen (UA POC): 0.2 (ref 0.2–1)
pH (UA POC): 5 (ref 4.6–8.0)

## 2015-11-19 LAB — AMB POC PVR, MEAS,POST-VOID RES,US,NON-IMAGING: PVR: 13 cc

## 2015-11-19 NOTE — Progress Notes (Signed)
MRI/CT Scheduling Request    Best Contact Number(s): [phone]     Date of patient???s next appointment w/physician: Patient to make follow once MRI is scheduled    Date physician would like test done by: ASAP    Diagnosis Code(s):  R97.20    Location where test is to be scheduled:   ? [place]  ____X____  ? [place]  ________  ? [place]  ________    Name of Test(s) to be Scheduled:   ? MRI  __X_____                                                ? MRA  _______  ? CT  ________  ? CTA  _______  ? CT w/ 3D Reconstruction  ________  ? Anthrogram  ________  ? Ultrasound  ________  ? PVL  ________  ? X-ray  _________    These certain test can be scheduled at the [facility] Location:  ? [place]  ________  ? [place] RENAL FLOW WITH LASIX  ________  ? MRI PELVIS PI-RADS  _________

## 2015-11-19 NOTE — Progress Notes (Signed)
ASSESSMENT:   1. Benign prostatic hyperplasia with lower urinary tract symptoms, unspecified morphology    2. Elevated PSA    3. History of urinary retention        1. History of elevated and fluctuating PSA. PSA has been as high as 12.14ng/ml on 06/12/14 with UTI at that time. S/p negative prostate biopsy ~2003.  Most recent PSA of 9.04 ng/ml on 11/16/15.     2. BPH with stable minimal LUTS on intermittent Flomax 0.4 mg.       PLAN:   ?? PSA today reviewed with pt; rise to 9.04 ng/mL, 14 % free.   ?? Continue Flomax 0.4 mg prn  ?? Discussed MRI fusion biopsy vs normal in office TRUS/PNBx.   ?? MRI of the prostate ordered today.   ?? Will call pt with MRI results and further discuss treatment options      Chief Complaint   Patient presents with   ??? Elevated PSA       HISTORY OF PRESENT ILLNESS:  [name] is a 70 y.o. male who is seen in follow up for elevated PSA. He was an established pt of Dr. Joaquim Nam.   He is s/p negative prostate biopsy ~2003 per pt report. PSA with significant increase to 12.14ng/ml on 06/12/14 from prior PSA of 5.68ng/ml in 12/2013. He had previously declined repeat prostate biopsy. H/o E. Coli UTI causing PSA elevation to 12. No recurrence of UTI. Recent PSA with rise to 9.04 on 11/16/15.     Urinary symptoms: nocturia x 0-1. He was started on flomax 0.4mg , which he continues on. He notes benefit from alpha blocker including decreased nocturia, stronger stream, less frequency, intermittency/urgency. He denies gross hematuria, dysuria or UTIs. Patient has history of urinary retention requiring catheterization secondary to decongestant with URI in 2013. No episodes since.     Currently on Xarelto for Afib.       PSA Trend  Lab Results   Component Value Date/Time    Prostate Specific Ag 9.04 11/16/2015 02:50 PM    Prostate Specific Ag 6.70 05/20/2015 01:24 PM    Prostate Specific Ag 6.14 08/07/2014 09:15 AM    Prostate Specific Ag 4.73 04/04/2013 09:09 AM    Prostate Specific Ag 5.99 08/20/2012       PSA Free 11/16/15  1.27  PSA % Free 11/16/15  14%           AUA Symptom Score 05/20/2015   Over the past month how often have you had the sensation that your bladder was not completely empty after you finished urinating? 0   Over the past month, how often have had to urinate again less than 2 hours after you last finished urinating? 1   Over the past month, how often have you found you stopped and started again several times when you urinated? 0   Over the past month, how often have you found it difficult to postpone urination? 1   Over the past month, how often have you had a weak urinary stream? 1   Over the past month, how often have you had to push or strain to begin urinating? 0   Over the past month, how many times did you most typically get up to urinate from the time you went to bed at night until the time you got up in the morning? 1   AUA Score 4   If you were to spend the rest of your life with your urinary condition the  way it is now, how would you feel about that? Pleased         Past Medical History   Diagnosis Date   ??? A-fib ([place])    ??? Benign prostatic hyperplasia with lower urinary tract symptoms    ??? Diabetes mellitus ([place])    ??? Elevated prostate specific antigen (PSA)    ??? Enlarged prostate with lower urinary tract symptoms (LUTS)    ??? Hypercholesteremia    ??? Hypertrophy of prostate with urinary obstruction and other lower urinary tract symptoms (LUTS)    ??? Urinary retention    ??? UTI (lower urinary tract infection)        Past Surgical History   Procedure Laterality Date   ??? Hx cyst removal         Social History   Substance Use Topics   ??? Smoking status: Never Smoker   ??? Smokeless tobacco: Never Used   ??? Alcohol use No       No Known Allergies    Family History   Problem Relation Age of Onset   ??? Diabetes Mother    ??? Hypertension Mother    ??? Diabetes Sister    ??? Hypertension Sister    ??? Stroke Sister        Current Outpatient Prescriptions   Medication Sig Dispense Refill    ??? ACCU-CHEK AVIVA PLUS TEST STRP strip      ??? lisinopril (PRINIVIL, ZESTRIL) 2.5 mg tablet      ??? metoprolol (LOPRESSOR) 25 mg tablet      ??? pravastatin (PRAVACHOL) 10 mg tablet      ??? XARELTO 20 mg tab tablet      ??? tamsulosin (FLOMAX) 0.4 mg capsule Take 1 Cap by mouth daily (after dinner). 90 Cap 3   ??? metFORMIN (GLUCOPHAGE) 500 mg tablet Take  by mouth two (2) times daily (with meals).         Review of Systems  Constitutional: Fever: No  Skin: Rash: No  HEENT: Hearing difficulty: No  Eyes: Blurred vision: No  Cardiovascular: Chest pain: No  Respiratory: Shortness of breath: No  Gastrointestinal: Nausea/vomiting: No  Musculoskeletal: Back pain: No  Neurological: Weakness: No  Psychological: Memory loss: No  Comments/additional findings:         PHYSICAL EXAMINATION:   Visit Vitals   ??? BP 113/80   ??? Ht 5\' 8"  (1.727 m)   ??? Wt 200 lb (90.7 kg)   ??? BMI 30.41 kg/m2     Constitutional: WDWN, Pleasant and appropriate affect, No acute distress.    CV:  No peripheral swelling noted  Respiratory: No respiratory distress or difficulties  Abdomen:  No abdominal masses or tenderness. No CVA tenderness. No inguinal hernias noted.   Skin: No jaundice.    Neuro/Psych:  Alert and oriented x 3, affect appropriate.   Lymphatic:   No enlarged inguinal lymph nodes.        REVIEW OF LABS AND IMAGING:    Results for orders placed or performed in visit on 11/19/15   AMB POC URINALYSIS DIP STICK AUTO W/O MICRO   Result Value Ref Range    Color (UA POC) Yellow     Clarity (UA POC) Clear     Glucose (UA POC) 1+ Negative    Bilirubin (UA POC) Negative Negative    Ketones (UA POC) Negative Negative    Specific gravity (UA POC) 1.010 1.001 - 1.035    Blood (UA POC) Negative Negative  pH (UA POC) 5.0 4.6 - 8.0    Protein (UA POC) Negative Negative mg/dL    Urobilinogen (UA POC) 0.2 mg/dL 0.2 - 1    Nitrites (UA POC) Negative Negative    Leukocyte esterase (UA POC) Negative Negative   AMB POC PVR, MEAS,POST-VOID RES,US,NON-IMAGING    Result Value Ref Range    PVR 13 cc         A copy of today's office visit with all pertinent imaging results and labs were sent to the referring physician.      Lenord Fellers, MD     Documentation assisted by Heron Nay, medical scribe for Lenord Fellers, MD.

## 2015-12-15 ENCOUNTER — Encounter

## 2015-12-23 ENCOUNTER — Ambulatory Visit: Admit: 2015-12-23 | Discharge: 2015-12-23 | Payer: MEDICARE | Attending: Urology | Primary: Internal Medicine

## 2015-12-23 DIAGNOSIS — R972 Elevated prostate specific antigen [PSA]: Secondary | ICD-10-CM

## 2015-12-23 LAB — AMB POC URINALYSIS DIP STICK AUTO W/O MICRO
Bilirubin (UA POC): NEGATIVE
Blood (UA POC): NEGATIVE
Glucose (UA POC): NEGATIVE
Ketones (UA POC): NEGATIVE
Leukocyte esterase (UA POC): NEGATIVE
Nitrites (UA POC): NEGATIVE
Specific gravity (UA POC): 1.03 (ref 1.001–1.035)
Urobilinogen (UA POC): 0.2 (ref 0.2–1)
pH (UA POC): 7 (ref 4.6–8.0)

## 2015-12-23 LAB — AMB POC PVR, MEAS,POST-VOID RES,US,NON-IMAGING: PVR: 0 cc

## 2015-12-23 MED ORDER — LEVOFLOXACIN 750 MG TAB
750 mg | ORAL_TABLET | ORAL | 0 refills | Status: DC
Start: 2015-12-23 — End: 2016-02-01

## 2015-12-23 NOTE — Progress Notes (Signed)
[name]  DOB [birth year]     ASSESSMENT:   Encounter Diagnoses   Name Primary?   ??? Elevated PSA Yes   ??? History of needle biopsy of prostate with negative result    ??? BPH (benign prostatic hypertrophy) with urinary obstruction       1. History of elevated and fluctuating PSA. PSA has been as high as 12.14ng/ml on 06/12/14 with UTI at that time. S/p negative prostate biopsy ~2003. Most recent PSA of 9.04 ng/ml on 11/16/15. MRI pelvis on 12/04/15 demonstrated a 9 mm lesion at the left posterior apex peripheral zone and a 5 mm lesion at the right posterior apex peripheral zone, both consistent with PIRADS Category 4.     2. BPH with stable minimal LUTS on intermittent Flomax 0.4 mg.       PLAN:   1. Reviewed MRI pelvis with patient - 2 PIRADS 4 lesions  2. Hold off on Flomax 0.4 mg for 1 week and determine if there is a difference in urinary symptoms off medication.  3. Recommended a MRI fusion prostate biopsy to rule out prostate cancer, given patient's elevated PSA and MRI results. Reviewed indications, risk/benefits, and possible side effects. Answered all patient questions. Patient desires to proceed with procedure. Biopsy literature given today.   4. Script for Levaquin 750 mg tablet provided today, instructed patient to take medication 2 hours prior to procedure.  5. Advised patient to stop all NSAIDS and blood thinners 7 days prior to procedure      Chief Complaint   Patient presents with   ??? Benign Prostatic Hypertrophy     last pvr=13       HISTORY OF PRESENT ILLNESS:  [name] is a 70 y.o. male who is seen in follow up for elevated PSA. He was an established pt of Dr. Joaquim Nam.     Patient is s/p negative prostate biopsy ~2003 per pt report. PSA with significant increase to 12.14ng/ml on 06/12/14 from prior PSA of 5.68ng/ml in 12/2013. He had previously declined repeat prostate biopsy. History of E. Coli UTI causing PSA elevation to 12.0 ng/mL. No recurrence of UTI.  Most recent PSA with rise to 9.04 ng/mL on 11/16/15. MRI pelvis on 12/04/15 demonstrated a 9 mm lesion at the left posterior apex peripheral zone and a 5 mm lesion at the right posterior apex peripheral zone, both consistent with PIRADS Category 4. He has a family history of prostate cancer in his father, died from other causes.    Urinary symptoms: nocturia x 0-1. He was started on flomax 0.4mg , which he continues on. He notes benefit from alpha blocker including decreased nocturia, stronger stream, less frequency, intermittency/urgency. He denies gross hematuria, dysuria or UTIs. Patient has history of urinary retention requiring catheterization secondary to decongestant with URI in 2013. No episodes since.     Currently on Xarelto for Afib.     MRI PELV W WO CONT 12/04/15  IMPRESSION:  Approximate 9 mm lesion, left posterior apex peripheral zone- PIRADS Category 4  Approximate 5 mm lesion, right posterior apex peripheral zone- PIRADS Category 4  Benign prostatic hyperplasia     PSA /TESTOSTERONE - [facility] PSA % Free PSA   Latest Ref Rng & Units 0.00 - 4.00 ng/mL %   11/16/2015 9.04 (H) 14   05/20/2015 6.70 (H)    08/07/2014 6.14 (H)    06/12/2014 12.14 (H)    12/08/2013 5.68 (H)    04/04/2013 4.73 (A)    08/20/2012  5.99 (A)        AUA Symptom Score 05/20/2015   Over the past month how often have you had the sensation that your bladder was not completely empty after you finished urinating? 0   Over the past month, how often have had to urinate again less than 2 hours after you last finished urinating? 1   Over the past month, how often have you found you stopped and started again several times when you urinated? 0   Over the past month, how often have you found it difficult to postpone urination? 1   Over the past month, how often have you had a weak urinary stream? 1   Over the past month, how often have you had to push or strain to begin urinating? 0   Over the past month, how many times did you most typically get up to  urinate from the time you went to bed at night until the time you got up in the morning? 1   AUA Score 4   If you were to spend the rest of your life with your urinary condition the way it is now, how would you feel about that? Pleased         Past Medical History:   Diagnosis Date   ??? A-fib ([place])    ??? Benign prostatic hyperplasia with lower urinary tract symptoms    ??? Diabetes mellitus ([place])    ??? Elevated prostate specific antigen (PSA)    ??? Enlarged prostate with lower urinary tract symptoms (LUTS)    ??? History of urinary retention    ??? Hypercholesteremia    ??? Hypertrophy of prostate with urinary obstruction and other lower urinary tract symptoms (LUTS)    ??? Urinary retention    ??? UTI (lower urinary tract infection)        Past Surgical History:   Procedure Laterality Date   ??? HX CYST REMOVAL         Social History   Substance Use Topics   ??? Smoking status: Never Smoker   ??? Smokeless tobacco: Never Used   ??? Alcohol use No       No Known Allergies    Family History   Problem Relation Age of Onset   ??? Diabetes Mother    ??? Hypertension Mother    ??? Diabetes Sister    ??? Hypertension Sister    ??? Stroke Sister        Current Outpatient Prescriptions   Medication Sig Dispense Refill   ??? ACCU-CHEK AVIVA PLUS TEST STRP strip      ??? metoprolol (LOPRESSOR) 25 mg tablet      ??? XARELTO 20 mg tab tablet      ??? tamsulosin (FLOMAX) 0.4 mg capsule Take 1 Cap by mouth daily (after dinner). 90 Cap 3   ??? metFORMIN (GLUCOPHAGE) 500 mg tablet Take  by mouth two (2) times daily (with meals).     ??? pravastatin (PRAVACHOL) 10 mg tablet          Review of Systems  Constitutional: Fever: No  Skin: Rash: No  HEENT: Hearing difficulty: No  Eyes: Blurred vision: No  Cardiovascular: Chest pain: No  Respiratory: Shortness of breath: No  Gastrointestinal: Nausea/vomiting: No  Musculoskeletal: Back pain: No  Neurological: Weakness: No  Psychological: Memory loss: No  Comments/additional findings:         PHYSICAL EXAMINATION:   Visit Vitals    ??? BP 140/85   ??? Ht 5'  8" (1.727 m)   ??? Wt 205 lb (93 kg)   ??? BMI 31.17 kg/m2     Constitutional: WDWN, Pleasant and appropriate affect, No acute distress.    Neuro/Psych:  Alert and oriented x 3, affect appropriate.     REVIEW OF LABS AND IMAGING:    Results for orders placed or performed in visit on 12/23/15   AMB POC URINALYSIS DIP STICK AUTO W/O MICRO   Result Value Ref Range    Color (UA POC) Yellow     Clarity (UA POC) Clear     Glucose (UA POC) Negative Negative    Bilirubin (UA POC) Negative Negative    Ketones (UA POC) Negative Negative    Specific gravity (UA POC) 1.030 1.001 - 1.035    Blood (UA POC) Negative Negative    pH (UA POC) 7.0 4.6 - 8.0    Protein (UA POC) Trace Negative mg/dL    Urobilinogen (UA POC) 0.2 mg/dL 0.2 - 1    Nitrites (UA POC) Negative Negative    Leukocyte esterase (UA POC) Negative Negative   AMB POC PVR, MEAS,POST-VOID RES,US,NON-IMAGING   Result Value Ref Range    PVR 0 cc         CC: DIPES Glenice Laine, MD     Staci Acosta     Medical documentation provided with the assistance of Staci Acosta, medical scribe for Staci Acosta     Discussed the patient's BMI with him.  The BMI follow up plan is as follows: BMI is out of normal parameters and plan is as follows: I have counseled this patient on diet and exercise regimens

## 2015-12-27 LAB — URINE C&S

## 2016-01-20 ENCOUNTER — Inpatient Hospital Stay: Payer: MEDICARE

## 2016-01-20 MED ORDER — DIPHENHYDRAMINE 25 MG CAP
25 mg | Freq: Once | ORAL | Status: DC | PRN
Start: 2016-01-20 — End: 2016-01-20

## 2016-01-20 MED ORDER — ONDANSETRON (PF) 4 MG/2 ML INJECTION
4 mg/2 mL | Freq: Once | INTRAMUSCULAR | Status: DC | PRN
Start: 2016-01-20 — End: 2016-01-20

## 2016-01-20 MED ORDER — HYDROMORPHONE (PF) 1 MG/ML IJ SOLN
1 mg/mL | INTRAMUSCULAR | Status: DC | PRN
Start: 2016-01-20 — End: 2016-01-20

## 2016-01-20 MED ORDER — OXYCODONE 5 MG TAB
5 mg | ORAL | Status: DC | PRN
Start: 2016-01-20 — End: 2016-01-20

## 2016-01-20 MED ORDER — ACETAMINOPHEN 325 MG TABLET
325 mg | Freq: Once | ORAL | Status: DC | PRN
Start: 2016-01-20 — End: 2016-01-20

## 2016-01-20 MED ORDER — DIPHENHYDRAMINE HCL 50 MG/ML IJ SOLN
50 mg/mL | INTRAMUSCULAR | Status: DC | PRN
Start: 2016-01-20 — End: 2016-01-20

## 2016-01-20 MED ORDER — FENTANYL CITRATE (PF) 50 MCG/ML IJ SOLN
50 mcg/mL | INTRAMUSCULAR | Status: DC | PRN
Start: 2016-01-20 — End: 2016-01-20
  Administered 2016-01-20 (×2): via INTRAVENOUS

## 2016-01-20 MED ORDER — MIDAZOLAM 1 MG/ML IJ SOLN
1 mg/mL | INTRAMUSCULAR | Status: DC | PRN
Start: 2016-01-20 — End: 2016-01-20
  Administered 2016-01-20: 13:00:00 via INTRAVENOUS

## 2016-01-20 MED ORDER — LACTATED RINGERS IV
INTRAVENOUS | Status: DC | PRN
Start: 2016-01-20 — End: 2016-01-20
  Administered 2016-01-20: 13:00:00 via INTRAVENOUS

## 2016-01-20 MED ORDER — LACTATED RINGERS IV
INTRAVENOUS | Status: DC
Start: 2016-01-20 — End: 2016-01-20
  Administered 2016-01-20: 13:00:00 via INTRAVENOUS

## 2016-01-20 MED ORDER — OXYCODONE-ACETAMINOPHEN 5 MG-325 MG TAB
5-325 mg | ORAL_TABLET | Freq: Four times a day (QID) | ORAL | 0 refills | Status: DC | PRN
Start: 2016-01-20 — End: 2016-02-01

## 2016-01-20 MED ORDER — INSULIN REGULAR HUMAN 100 UNIT/ML INJECTION
100 unit/mL | Freq: Once | INTRAMUSCULAR | Status: DC | PRN
Start: 2016-01-20 — End: 2016-01-20

## 2016-01-20 MED ORDER — GENTAMICIN 40 MG/ML IJ SOLN
40 mg/mL | INTRAMUSCULAR | Status: AC
Start: 2016-01-20 — End: 2016-01-20
  Administered 2016-01-20 (×2): via INTRAVENOUS

## 2016-01-20 MED ORDER — PROPOFOL 10 MG/ML IV EMUL
10 mg/mL | INTRAVENOUS | Status: DC | PRN
Start: 2016-01-20 — End: 2016-01-20
  Administered 2016-01-20: 13:00:00 via INTRAVENOUS

## 2016-01-20 MED ORDER — HALOPERIDOL LACTATE 5 MG/ML IJ SOLN
5 mg/mL | INTRAMUSCULAR | Status: DC | PRN
Start: 2016-01-20 — End: 2016-01-20

## 2016-01-20 MED ORDER — DOCUSATE SODIUM 100 MG CAP
100 mg | ORAL_CAPSULE | Freq: Two times a day (BID) | ORAL | 0 refills | Status: DC
Start: 2016-01-20 — End: 2016-02-01

## 2016-01-20 MED ORDER — MORPHINE 2 MG/ML INJECTION
2 mg/mL | INTRAMUSCULAR | Status: DC | PRN
Start: 2016-01-20 — End: 2016-01-20

## 2016-01-20 MED ORDER — PROPOFOL 10 MG/ML IV EMUL
10 mg/mL | INTRAVENOUS | Status: DC | PRN
Start: 2016-01-20 — End: 2016-01-20
  Administered 2016-01-20 (×2): via INTRAVENOUS

## 2016-01-20 MED ORDER — MEPERIDINE (PF) 25 MG/ML INJ SOLUTION
25 mg/ml | INTRAMUSCULAR | Status: DC | PRN
Start: 2016-01-20 — End: 2016-01-20

## 2016-01-20 MED ORDER — OXYCODONE-ACETAMINOPHEN 5 MG-325 MG TAB
5-325 mg | ORAL | Status: DC | PRN
Start: 2016-01-20 — End: 2016-01-20
  Administered 2016-01-20: 14:00:00 via ORAL

## 2016-01-20 MED ORDER — NALOXONE 0.4 MG/ML INJECTION
0.4 mg/mL | INTRAMUSCULAR | Status: DC | PRN
Start: 2016-01-20 — End: 2016-01-20

## 2016-01-20 MED ORDER — MIDAZOLAM 1 MG/ML IJ SOLN
1 mg/mL | INTRAMUSCULAR | Status: DC
Start: 2016-01-20 — End: 2016-01-20

## 2016-01-20 MED ORDER — DEXAMETHASONE SODIUM PHOSPHATE 4 MG/ML IJ SOLN
4 mg/mL | Freq: Once | INTRAMUSCULAR | Status: DC | PRN
Start: 2016-01-20 — End: 2016-01-20

## 2016-01-20 MED ORDER — FENTANYL CITRATE (PF) 50 MCG/ML IJ SOLN
50 mcg/mL | INTRAMUSCULAR | Status: DC
Start: 2016-01-20 — End: 2016-01-20

## 2016-01-20 MED ORDER — HYDROCODONE-ACETAMINOPHEN 5 MG-325 MG TAB
5-325 mg | ORAL | Status: DC | PRN
Start: 2016-01-20 — End: 2016-01-20

## 2016-01-20 MED ORDER — FLUMAZENIL 0.1 MG/ML IV SOLN
0.1 mg/mL | INTRAVENOUS | Status: DC | PRN
Start: 2016-01-20 — End: 2016-01-20

## 2016-01-20 MED ORDER — FENTANYL CITRATE (PF) 50 MCG/ML IJ SOLN
50 mcg/mL | INTRAMUSCULAR | Status: DC | PRN
Start: 2016-01-20 — End: 2016-01-20

## 2016-01-20 MED ORDER — ALBUTEROL SULFATE 2.5 MG/0.5 ML NEB SOLUTION
2.5 mg/0.5 mL | Freq: Once | RESPIRATORY_TRACT | Status: DC | PRN
Start: 2016-01-20 — End: 2016-01-20

## 2016-01-20 MED ORDER — HYDRALAZINE 20 MG/ML IJ SOLN
20 mg/mL | INTRAMUSCULAR | Status: DC | PRN
Start: 2016-01-20 — End: 2016-01-20

## 2016-01-20 MED FILL — HYDROMORPHONE (PF) 1 MG/ML IJ SOLN: 1 mg/mL | INTRAMUSCULAR | Qty: 1

## 2016-01-20 MED FILL — LACTATED RINGERS IV: INTRAVENOUS | Qty: 1000

## 2016-01-20 MED FILL — MIDAZOLAM 1 MG/ML IJ SOLN: 1 mg/mL | INTRAMUSCULAR | Qty: 2

## 2016-01-20 MED FILL — FENTANYL CITRATE (PF) 50 MCG/ML IJ SOLN: 50 mcg/mL | INTRAMUSCULAR | Qty: 2

## 2016-01-20 MED FILL — DIPHENHYDRAMINE 25 MG CAP: 25 mg | ORAL | Qty: 1

## 2016-01-20 MED FILL — INSULIN REGULAR HUMAN 100 UNIT/ML INJECTION: 100 unit/mL | INTRAMUSCULAR | Qty: 1

## 2016-01-20 MED FILL — SODIUM CHLORIDE 0.9 % IV: INTRAVENOUS | Qty: 100

## 2016-01-20 MED FILL — OXYCODONE-ACETAMINOPHEN 5 MG-325 MG TAB: 5-325 mg | ORAL | Qty: 1

## 2016-01-20 MED FILL — ALBUTEROL SULFATE 2.5 MG/0.5 ML NEB SOLUTION: 2.5 mg/0.5 mL | RESPIRATORY_TRACT | Qty: 0.5

## 2016-01-20 NOTE — Op Note (Signed)
??  OPERATIVE NOTE  ??  Date of Procedure: 01/20/2016     Preoperative Diagnosis: ELEVATED PSA TO 12, WITH RIGHT AND LEFT APICAL PiRAD 4 MRI LESIONS.  Postoperative Diagnosis: SAME    Procedure(s):  TRANSRECTAL ULTRASOUND AND BIOPSY OF THE PROSTATE USING THE URONAV MRI FUSION BIOPSY SYSTEM    Surgeon(s) and Role:  * Earnest Bailey, MD - Primary  ????  Surgical Staff:  Circ-1: Avel Peace, RN  Scrub Tech-1: [place]  Float Staff: Milda Smart, RN    Event Time In   Incision Start 9:25 AM   Incision Close 9:44 AM   ??  Anesthesia: MAC     Estimated Blood Loss: minimal    Specimens:   ID Type Source Tests Collected by Time Destination   1 : Prostate biopsy right base, Prostate biopsy right lateral base, Prostate biopsy right mid, Prostate biopsy right lateral mid,Prostate biopsy right apex, Prostate biopsy right lateral apex  Tissue Prostate AP HISTOLOGY Avel Peace, RN 01/20/2016 9:20 AM ??   2 : Prostate biopsy left base, Prostate biopsy left lateral base, Prostate biopsy left mid, Prostate biopsy left lateral mid,Prostate biopsy left apex, Prostate biopsy left lateral apex  Tissue Prostate AP HISTOLOGY Avel Peace, RN 01/20/2016 9:24 AM ??   3 : Prostate biopsy region of interest #2 Tissue Prostate AP HISTOLOGY Avel Peace, RN 01/20/2016 9:27 AM ??   4 : Prostate biopsy left region of interest #1 Tissue Prostate AP HISTOLOGY Avel Peace, RN 01/20/2016 9:31 AM ??   ????  Findings: 70 gram prostate 12 core standard biopsy taken with right ROI biopsied X 3 and Left ROI biopsied X 4.    Complications: none    Implants: NONE    Procedure:  The patient was given Levaquin and Genatamicin and and no bowel prep prep prior to the procedure described below. He was placed in the left lateral decubitus position with knees drawn up.  ????  An endorectal probe was placed in the rectum. Transverse and sagittal images of the prostate and seminal vesicles were obtained. We then captured the prostate  ultrasound images in the transverse plan onto the [place]. The images were then contoured and smoothed. The refined images were then fused to the MRI images. The PIRAD 4 lesions were noted on the fused imaging in the above location noted above and then the transrectal ultrasound directed biopsies were taken from that regions of interest. A total of 4 biopsies were taken from the ROI on the left and 3 biopsies taken on the right. After confirming the region of interests had been covered with the needed biopsies a standard 12 core biopsy was taken of the right and left prostate lobes. Tissue cores were placed in formalin jars and sent for pathologic review. No other areas of concern were noted and the procedure was terminated.  ????  The probe was removed and the patient was observed. He was advised to finish his prescribed antibiotics and avoid strenuous physical and sexual activity for several days. He was told to call our office if he developed fever, excessive or persistent blood in the urine/stool, or difficulty voiding. We arranged follow-up so that we could review the results of this biopsy.  ????  Biopsy-Number of cores:   ????  Base Mid Apex Lat Base Lat Mid Lat Apex ROI #1 and ROI #2  Right Side 1 1 1 1 1  1  3  Left Side    1 1 1 1 1 1                                      4   ????  Interpretation:  Prostate Exam :3+  Prostatic Volume:70 cc  PSA Density: 0.135  Prostatic capsule: normal  Transition Zone: enlarged   Lesions: PiRAD 4 right peripheral apex and left peripheral apex Standard 12 core biopsy also taken. Please see MRI for exact measurements.  Seminal Vesicles: normal  ????  Earnest Bailey, MD       ??  ??

## 2016-01-20 NOTE — H&P (Signed)
[name]  DOB 1946/06/14   ??  ASSESSMENT:        Encounter Diagnoses   Name Primary?   ??? Elevated PSA Yes   ??? History of needle biopsy of prostate with negative result ??   ??? BPH (benign prostatic hypertrophy) with urinary obstruction ??      1. History of elevated and fluctuating PSA. PSA has been as high as 12.14ng/ml on 06/12/14 with UTI at that time. S/p negative prostate biopsy ~2003. Most recent PSA of 9.04 ng/ml on 11/16/15. MRI pelvis on 12/04/15 demonstrated a 9 mm lesion at the left posterior apex peripheral zone and a 5 mm lesion at the right posterior apex peripheral zone, both consistent with PIRADS Category 4.   ??  2. BPH with stable minimal LUTS on intermittent Flomax 0.4 mg.   ??  ??  PLAN:   1. Reviewed MRI pelvis with patient - 2 PIRADS 4 lesions  2. Hold off on Flomax 0.4 mg for 1 week and determine if there is a difference in urinary symptoms off medication.  3. Recommended a MRI fusion prostate biopsy to rule out prostate cancer, given patient's elevated PSA and MRI results. Reviewed indications, risk/benefits, and possible side effects. Answered all patient questions. Patient desires to proceed with procedure. Biopsy literature given today.   4. Script for Levaquin 750 mg tablet provided today, instructed patient to take medication 2 hours prior to procedure.  5. Advised patient to stop all NSAIDS and blood thinners 7 days prior to procedure  ??  ??       Chief Complaint   Patient presents with   ??? Benign Prostatic Hypertrophy   ?? ?? last pvr=13   ??  ??  HISTORY OF PRESENT ILLNESS: [name] is a 70 y.o. male who is seen in follow up for elevated PSA. He was an established pt of Dr. Joaquim Nam.   ??  Patient is s/p negative prostate biopsy ~2003 per pt report. PSA with significant increase to 12.14ng/ml on 06/12/14 from prior PSA of 5.68ng/ml in 12/2013. He had previously declined repeat prostate biopsy. History of E. Coli UTI causing PSA elevation to 12.0 ng/mL. No recurrence of UTI.  Most recent PSA with rise to 9.04 ng/mL on 11/16/15. MRI pelvis on 12/04/15 demonstrated a 9 mm lesion at the left posterior apex peripheral zone and a 5 mm lesion at the right posterior apex peripheral zone, both consistent with PIRADS Category 4. He has a family history of prostate cancer in his father, died from other causes.  ??  Urinary symptoms: nocturia x 0-1. He was started on flomax 0.4mg , which he continues on. He notes benefit from alpha blocker including decreased nocturia, stronger stream, less frequency, intermittency/urgency. He denies gross hematuria, dysuria or UTIs. Patient has history of urinary retention requiring catheterization secondary to decongestant with URI in 2013. No episodes since.   ??  Currently on Xarelto for Afib.   ??  MRI PELV W WO CONT 12/04/15  IMPRESSION:  Approximate 9 mm lesion, left posterior apex peripheral zone- PIRADS Category 4  Approximate 5 mm lesion, right posterior apex peripheral zone- PIRADS Category 4  Benign prostatic hyperplasia   ??  PSA /TESTOSTERONE - [facility] PSA % Free PSA   Latest Ref Rng & Units 0.00 - 4.00 ng/mL %   11/16/2015 9.04 (H) 14   05/20/2015 6.70 (H) ??   08/07/2014 6.14 (H) ??   06/12/2014 12.14 (H) ??   12/08/2013 5.68 (H) ??  04/04/2013 4.73 (A) ??   08/20/2012 5.99 (A) ??   ??  ??  AUA Symptom Score 05/20/2015   Over the past month how often have you had the sensation that your bladder was not completely empty after you finished urinating? 0   Over the past month, how often have had to urinate again less than 2 hours after you last finished urinating? 1   Over the past month, how often have you found you stopped and started again several times when you urinated? 0   Over the past month, how often have you found it difficult to postpone urination? 1   Over the past month, how often have you had a weak urinary stream? 1   Over the past month, how often have you had to push or strain to begin urinating? 0    Over the past month, how many times did you most typically get up to urinate from the time you went to bed at night until the time you got up in the morning? 1   AUA Score 4   If you were to spend the rest of your life with your urinary condition the way it is now, how would you feel about that? Pleased   ??  ??  ??       Past Medical History:   Diagnosis Date   ??? A-fib ([place]) ??   ??? Benign prostatic hyperplasia with lower urinary tract symptoms ??   ??? Diabetes mellitus ([place]) ??   ??? Elevated prostate specific antigen (PSA) ??   ??? Enlarged prostate with lower urinary tract symptoms (LUTS) ??   ??? History of urinary retention ??   ??? Hypercholesteremia ??   ??? Hypertrophy of prostate with urinary obstruction and other lower urinary tract symptoms (LUTS) ??   ??? Urinary retention ??   ??? UTI (lower urinary tract infection) ??   ??  ??        Past Surgical History:   Procedure Laterality Date   ??? HX CYST REMOVAL ?? ??   ??  ??       Social History   Substance Use Topics   ??? Smoking status: Never Smoker   ??? Smokeless tobacco: Never Used   ??? Alcohol use No   ??  ??  No Known Allergies  ??        Family History   Problem Relation Age of Onset   ??? Diabetes Mother ??   ??? Hypertension Mother ??   ??? Diabetes Sister ??   ??? Hypertension Sister ??   ??? Stroke Sister ??   ??  ??         Current Outpatient Prescriptions   Medication Sig Dispense Refill   ??? ACCU-CHEK AVIVA PLUS TEST STRP strip ?? ?? ??   ??? metoprolol (LOPRESSOR) 25 mg tablet ?? ?? ??   ??? XARELTO 20 mg tab tablet ?? ?? ??   ??? tamsulosin (FLOMAX) 0.4 mg capsule Take 1 Cap by mouth daily (after dinner). 90 Cap 3   ??? metFORMIN (GLUCOPHAGE) 500 mg tablet Take by mouth two (2) times daily (with meals). ?? ??   ??? pravastatin (PRAVACHOL) 10 mg tablet ?? ?? ??   ??  ??  Review of Systems  Constitutional: Fever: No  Skin: Rash: No  HEENT: Hearing difficulty: No  Eyes: Blurred vision: No  Cardiovascular: Chest pain: No  Respiratory: Shortness of breath: No  Gastrointestinal: Nausea/vomiting: No  Musculoskeletal: Back pain: No  Neurological: Weakness: No  Psychological: Memory loss: No  Comments/additional findings:   ??  ??  ??  PHYSICAL EXAMINATION:   Visit Vitals   ??? BP 136/86 (BP 1 Location: Left arm)   ??? Pulse 61   ??? Temp 97.9 ??F (36.6 ??C)   ??? Resp 18   ??? Ht 5\' 8"  (1.727 m)   ??? Wt 206 lb 12.8 oz (93.8 kg)   ??? SpO2 98%   ??? BMI 31.44 kg/m2     Constitutional: WDWN, Pleasant and appropriate affect, No acute distress.    CV:  No peripheral swelling noted, RRR  Respiratory: No respiratory distress or difficulties, CTAB  Abdomen:  No abdominal masses or tenderness.  No CVA tenderness. No hernias noted.   GU Male:    DRE: Deferred.  SCROTUM:  No scrotal rash or lesions noticed.  Normal bilateral testes and epididymis.   PENIS: Urethral meatus normal in location and size. No urethral discharge.  Skin: No jaundice.    Neuro/Psych:  Alert and oriented x 3. Affect appropriate.   Lymphatic:   No enlarged inguinal lymph nodes.  ??  REVIEW OF LABS AND IMAGING:         Results for orders placed or performed in visit on 12/23/15   AMB POC URINALYSIS DIP STICK AUTO W/O MICRO   Result Value Ref Range   ?? Color (UA POC) Yellow ??   ?? Clarity (UA POC) Clear ??   ?? Glucose (UA POC) Negative Negative   ?? Bilirubin (UA POC) Negative Negative   ?? Ketones (UA POC) Negative Negative   ?? Specific gravity (UA POC) 1.030 1.001 - 1.035   ?? Blood (UA POC) Negative Negative   ?? pH (UA POC) 7.0 4.6 - 8.0   ?? Protein (UA POC) Trace Negative mg/dL   ?? Urobilinogen (UA POC) 0.2 mg/dL 0.2 - 1   ?? Nitrites (UA POC) Negative Negative   ?? Leukocyte esterase (UA POC) Negative Negative   AMB POC PVR, MEAS,POST-VOID RES,US,NON-IMAGING   Result Value Ref Range   ?? PVR 0 cc   ??  ??  Earnest Bailey, MD

## 2016-01-20 NOTE — Brief Op Note (Signed)
BRIEF OPERATIVE NOTE    Date of Procedure: 01/20/2016   Preoperative Diagnosis: ELEVATED PSA  Postoperative Diagnosis: ELEVATED PSA    Procedure(s):  TRANSRECTAL ULTRASOUND AND BIOPSY  OF THE PROSTATE  Surgeon(s) and Role:     * Earnest Bailey, MD - Primary            Surgical Staff:  Circ-1: Avel Peace, RN  Scrub Tech-1: Darius Bump [place]  Float Staff: Milda Smart, RN  Event Time In   Incision Start  9:25 AM   Incision Close  9:44 AM     Anesthesia: MAC   Estimated Blood Loss: minimal  Specimens:   ID Type Source Tests Collected by Time Destination   1 : Prostate biopsy right base, Prostate biopsy right lateral base, Prostate biopsy right mid, Prostate biopsy right lateral mid,Prostate biopsy right apex, Prostate biopsy right lateral apex  Tissue Prostate AP HISTOLOGY Avel Peace, RN 01/20/2016  9:20 AM    2 : Prostate biopsy left base, Prostate biopsy left lateral base, Prostate biopsy left mid, Prostate biopsy left lateral mid,Prostate biopsy left apex, Prostate biopsy left lateral apex  Tissue Prostate AP HISTOLOGY Avel Peace, RN 01/20/2016  9:24 AM    3 : Prostate biopsy region of interest #2 Tissue Prostate AP HISTOLOGY Avel Peace, RN 01/20/2016  9:27 AM    4 : Prostate biopsy left region of interest #1 Tissue Prostate AP HISTOLOGY Avel Peace, RN 01/20/2016  9:31 AM       Findings: 70 gram prostate 12 core standard biopsy taken with right ROI biopsied X 3 and Left ROI biopsied X 4.  Complications: none  Implants: * No implants in log *

## 2016-01-20 NOTE — Op Note (Signed)
??  OPERATIVE NOTE  ??  Date of Procedure: 01/20/2016     Preoperative Diagnosis: ELEVATED PSA TO 12, WITH RIGHT AND LEFT APICAL PiRAD 4 MRI LESIONS.  Postoperative Diagnosis: SAME    Procedure(s):  TRANSRECTAL ULTRASOUND AND BIOPSY OF THE PROSTATE USING THE URONAV MRI FUSION BIOPSY SYSTEM    Surgeon(s) and Role:  * Earnest Bailey, MD - Primary  ????  Surgical Staff:  Circ-1: Avel Peace, RN  Scrub Tech-1: [place]  Float Staff: Milda Smart, RN    Event Time In   Incision Start 9:25 AM   Incision Close 9:44 AM   ??  Anesthesia: MAC     Estimated Blood Loss: minimal    Specimens:   ID Type Source Tests Collected by Time Destination   1 : Prostate biopsy right base, Prostate biopsy right lateral base, Prostate biopsy right mid, Prostate biopsy right lateral mid,Prostate biopsy right apex, Prostate biopsy right lateral apex  Tissue Prostate AP HISTOLOGY Avel Peace, RN 01/20/2016 9:20 AM ??   2 : Prostate biopsy left base, Prostate biopsy left lateral base, Prostate biopsy left mid, Prostate biopsy left lateral mid,Prostate biopsy left apex, Prostate biopsy left lateral apex  Tissue Prostate AP HISTOLOGY Avel Peace, RN 01/20/2016 9:24 AM ??   3 : Prostate biopsy region of interest #2 Tissue Prostate AP HISTOLOGY Avel Peace, RN 01/20/2016 9:27 AM ??   4 : Prostate biopsy left region of interest #1 Tissue Prostate AP HISTOLOGY Avel Peace, RN 01/20/2016 9:31 AM ??   ????  Findings: 70 gram prostate 12 core standard biopsy taken with right ROI biopsied X 3 and Left ROI biopsied X 4.    Complications: none    Implants: NONE    Procedure:  The patient was given Levaquin and Genatamicin and and no bowel prep prep prior to the procedure described below. He was placed in the left lateral decubitus position with knees drawn up.  ????  An endorectal probe was placed in the rectum. Transverse and sagittal images of the prostate and seminal vesicles were obtained. We then  captured the prostate ultrasound images in the transverse plan onto the [place]. The images were then contoured and smoothed. The refined images were then fused to the MRI images. The PIRAD 4 lesions were noted on the fused imaging in the above location noted above and then the transrectal ultrasound directed biopsies were taken from that regions of interest. A total of 4 biopsies were taken from the ROI on the left and 3 biopsies taken on the right. After confirming the region of interests had been covered with the needed biopsies a standard 12 core biopsy was taken of the right and left prostate lobes. Tissue cores were placed in formalin jars and sent for pathologic review. No other areas of concern were noted and the procedure was terminated.  ????  The probe was removed and the patient was observed. He was advised to finish his prescribed antibiotics and avoid strenuous physical and sexual activity for several days. He was told to call our office if he developed fever, excessive or persistent blood in the urine/stool, or difficulty voiding. We arranged follow-up so that we could review the results of this biopsy.  ????  Biopsy-Number of cores:   ????  Base Mid Apex Lat Base Lat Mid Lat Apex ROI #1 and ROI #2  Right Side 1 1 1 1 1  1  3  Left Side    1 1 1 1 1 1                                      4   ????  Interpretation:  Prostate Exam :3+  Prostatic Volume:70 cc  PSA Density: 0.135  Prostatic capsule: normal  Transition Zone: enlarged   Lesions: PiRAD 4 right peripheral apex and left peripheral apex Standard 12 core biopsy also taken. Please see MRI for exact measurements.  Seminal Vesicles: normal  ????  Earnest Bailey, MD       ??  ??

## 2016-01-20 NOTE — Anesthesia Pre-Procedure Evaluation (Signed)
Anesthetic History   No history of anesthetic complications            Review of Systems / Medical History  Patient summary reviewed, nursing notes reviewed and pertinent labs reviewed    Pulmonary  Within defined limits                 Neuro/Psych   Within defined limits           Cardiovascular            Dysrhythmias       Exercise tolerance: >4 METS     GI/Hepatic/Renal  Within defined limits              Endo/Other    Diabetes         Other Findings              Physical Exam    Airway  Mallampati: II    Neck ROM: normal range of motion        Cardiovascular  Regular rate and rhythm,  S1 and S2 normal,  no murmur, click, rub, or gallop  Rhythm: regular  Rate: normal         Dental         Pulmonary  Breath sounds clear to auscultation               Abdominal  GI exam deferred       Other Findings            Anesthetic Plan    ASA: 3  Anesthesia type: MAC          Induction: Intravenous  Anesthetic plan and risks discussed with: Patient

## 2016-01-20 NOTE — Progress Notes (Signed)
Positive biopsy - will discuss at follow-up scheduled for 4/25

## 2016-01-20 NOTE — Anesthesia Post-Procedure Evaluation (Signed)
Post-Anesthesia Evaluation and Assessment    Patient: [name] MRN: [id number]  SSN: [id number]    Date of Birth: 09-22-1946  Age: 70 y.o.  Sex: male       Cardiovascular Function/Vital Signs  Visit Vitals   ??? BP 109/70 (BP 1 Location: Right arm, BP Patient Position: At rest)   ??? Pulse 62   ??? Temp 37.1 ??C (98.8 ??F)   ??? Resp 16   ??? Ht 5\' 8"  (1.727 m)   ??? Wt 93.8 kg (206 lb 12.8 oz)   ??? SpO2 95%   ??? BMI 31.44 kg/m2       Patient is status post No value filed. anesthesia for Procedure(s):  TRANSRECTAL ULTRASOUND AND BIOPSY  OF THE PROSTATE.    Nausea/Vomiting: None    Postoperative hydration reviewed and adequate.    Pain:  Pain Scale 1: Numeric (0 - 10) (01/20/16 1002)  Pain Intensity 1: 3 (01/20/16 1002)   Managed    Neurological Status:   Neuro (WDL): Within Defined Limits (01/20/16 0948)   At baseline    Mental Status and Level of Consciousness: Alert and oriented     Pulmonary Status:   O2 Device: Room air (01/20/16 1005)   Adequate oxygenation and airway patent    Complications related to anesthesia: None    Post-anesthesia assessment completed. No concerns    Signed By: Dwana Melena Jimeka Balan, MD     January 20, 2016

## 2016-01-22 MED FILL — PROPOFOL 10 MG/ML IV EMUL: 10 mg/mL | INTRAVENOUS | Qty: 89.11

## 2016-01-22 MED FILL — PROPOFOL 10 MG/ML IV EMUL: 10 mg/mL | INTRAVENOUS | Qty: 50

## 2016-02-01 ENCOUNTER — Ambulatory Visit: Admit: 2016-02-01 | Discharge: 2016-02-01 | Payer: MEDICARE | Attending: Urology | Primary: Internal Medicine

## 2016-02-01 DIAGNOSIS — C61 Malignant neoplasm of prostate: Secondary | ICD-10-CM

## 2016-02-01 LAB — AMB POC URINALYSIS DIP STICK AUTO W/O MICRO
Bilirubin (UA POC): NEGATIVE
Ketones (UA POC): NEGATIVE
Leukocyte esterase (UA POC): NEGATIVE
Nitrites (UA POC): NEGATIVE
Protein (UA POC): NEGATIVE mg/dL
Specific gravity (UA POC): 1.03 (ref 1.001–1.035)
Urobilinogen (UA POC): 0.2 (ref 0.2–1)
pH (UA POC): 5 (ref 4.6–8.0)

## 2016-02-01 LAB — AMB POC PVR, MEAS,POST-VOID RES,US,NON-IMAGING: PVR: 8 cc

## 2016-02-01 NOTE — Progress Notes (Signed)
NEWLY DIAGNOSED PROSTATE CANCER  [name]  DOB 09/05/1946     ASSESSMENT:   Encounter Diagnoses   Name Primary?   ??? Malignant neoplasm of prostate (HCC) Yes   ??? Elevated PSA    ??? Lower urinary tract symptoms (LUTS)       1. 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 Pre-biopsy PSA 9.04 ng/mL and TRUS volume 70 cc   - S/p negative prostate biopsy ~2003.   2. BPH with stable minimal LUTS on intermittent Flomax 0.4 mg.       PLAN:   1. Reviewed pathology results with patient- outlined below  2. Discussed treatment options for prostate cancer including active surveillance, EBRT, brachytherapy, proton therapy, cryotherapy, and open and robotic radical prostatectomy. Reviewed indications, risk/benefits, and possible side effects. Answered all patient questions. Patient is considering his options at this time.   3. Patient to be scheduled with one of my colleagues in the cancer center for further management of prostate cancer.    DISCUSSION: Using NCCN risk stratification criteria, his disease is considered INTERMEDIATE RISK  We discussed his diagnosis in detail, reviewing the significance of gleason score, PSA, DRE, and percentage of cores positive.  We discussed the various management options for prostate cancer, including active surveillance, open and robotic radical prostatectomy, EBRT, brachytherapy, proton therapy, and cryotherapy.  We discussed the generally indolent course of many prostate cancers, and the generally favorable oncologic control offered by all treatment strategies.  However, I emphasized that all treatments offer only potential for cure and that in many cases multimodal therapy may ultimately be utilized for long term cancer control.  We also discussed the impact of treatment on sexual and urinary function.  I emphasized that each treatment has unique  quality of life impact and recovery profiles, and we reviewed the possible effects of surgery, radiation, and cryotherapy on quality of life outcomes.  All questions were answered.    Chief Complaint   Patient presents with   ??? Elevated PSA     bx results   ??? Prostate Cancer     last pvr=0       HISTORY OF PRESENT ILLNESS:  [name] is a 70 y.o. male who presents today to discuss treatment options for his newly diagnosed prostate cancer.    Patient is s/p negative prostate biopsy ~2003 per pt report. PSA with significant increase to 12.14ng/ml on 06/12/14 from prior PSA of 5.68ng/ml in 12/2013. He had previously declined repeat prostate biopsy. History of E. Coli UTI causing PSA elevation to 12.0 ng/mL. No recurrence of UTI.  Most recent PSA with rise to 9.04 ng/mL on 11/16/15. MRI pelvis on 12/04/15 demonstrated a 9 mm lesion at the left posterior apex peripheral zone and a 5 mm lesion at the right posterior apex peripheral zone, both consistent with PIRADS Category 4. He subsequently underwent MRI fusion prostate biopsy on 01/20/16 by Dr. Wynona Luna. Pathology revealed T1c Gleason 4+3 (7) adenocarcinoma of the prostate in 7/19 cores involving 30-50%. TRUS volume 70 cc. He has a family history of prostate cancer in his father, died from other causes.    Continues on flomax 0.4mg  daily. He notes benefit from alpha blocker including decreased nocturia, stronger stream, less frequency, intermittency/urgency. He denies gross hematuria, dysuria or UTIs. Patient has history of urinary retention requiring catheterization secondary to decongestant with URI in 2013. No episodes since.  Currently on Xarelto for Afib.     PSA /TESTOSTERONE - [facility] PSA % Free PSA   Latest Ref Rng & Units 0.00 - 4.00 ng/mL %   11/16/2015 9.04 (H) 14   05/20/2015 6.70 (H)    08/07/2014 6.14 (H)    06/12/2014 12.14 (H)    12/08/2013 5.68 (H)    04/04/2013 4.73 (A)    08/20/2012 5.99 (A)        AUA Symptom Score 02/01/2016    Over the past month how often have you had the sensation that your bladder was not completely empty after you finished urinating? 0   Over the past month, how often have had to urinate again less than 2 hours after you last finished urinating? 3   Over the past month, how often have you found you stopped and started again several times when you urinated? 2   Over the past month, how often have you found it difficult to postpone urination? 2   Over the past month, how often have you had a weak urinary stream? 2   Over the past month, how often have you had to push or strain to begin urinating? 0   Over the past month, how many times did you most typically get up to urinate from the time you went to bed at night until the time you got up in the morning? 2   AUA Score 11   If you were to spend the rest of your life with your urinary condition the way it is now, how would you feel about that? Mixed-about equally satisfied         Past Medical History:   Diagnosis Date   ??? A-fib ([place])    ??? Benign prostatic hyperplasia with lower urinary tract symptoms    ??? BPH (benign prostatic hypertrophy) with urinary obstruction    ??? Diabetes mellitus ([place])    ??? Elevated prostate specific antigen (PSA)    ??? Enlarged prostate with lower urinary tract symptoms (LUTS)    ??? History of needle biopsy of prostate with negative result    ??? History of urinary retention    ??? Hypercholesteremia    ??? Hypertrophy of prostate with urinary obstruction and other lower urinary tract symptoms (LUTS)    ??? Urinary retention    ??? UTI (lower urinary tract infection)        Past Surgical History:   Procedure Laterality Date   ??? HX CYST REMOVAL         Social History   Substance Use Topics   ??? Smoking status: Never Smoker   ??? Smokeless tobacco: Never Used   ??? Alcohol use No       No Known Allergies    Family History   Problem Relation Age of Onset   ??? Diabetes Mother    ??? Hypertension Mother    ??? Diabetes Sister    ??? Hypertension Sister    ??? Stroke Sister         Current Outpatient Prescriptions   Medication Sig Dispense Refill   ??? ACCU-CHEK AVIVA PLUS TEST STRP strip      ??? metoprolol (LOPRESSOR) 25 mg tablet      ??? pravastatin (PRAVACHOL) 10 mg tablet      ??? XARELTO 20 mg tab tablet      ??? tamsulosin (FLOMAX) 0.4 mg capsule Take 1 Cap by mouth daily (after dinner). 90 Cap 3   ??? metFORMIN (GLUCOPHAGE) 500 mg tablet Take  by mouth two (2) times daily (with meals).         Review of Systems  Constitutional: Fever: No  Skin: Rash: No  HEENT: Hearing difficulty: No  Eyes: Blurred vision: No  Cardiovascular: Chest pain: No  Respiratory: Shortness of breath: No  Gastrointestinal: Nausea/vomiting: No  Musculoskeletal: Back pain: No  Neurological: Weakness: No  Psychological: Memory loss: No  Comments/additional findings:         PHYSICAL EXAMINATION:   Visit Vitals   ??? BP 121/80   ??? Ht 5\' 8"  (1.727 m)   ??? Wt 206 lb (93.4 kg)   ??? BMI 31.32 kg/m2     Constitutional: WDWN, Pleasant and appropriate affect, No acute distress.    Neuro/Psych:  Alert and oriented x 3, affect appropriate.     REVIEW OF LABS AND IMAGING:    Results for orders placed or performed in visit on 02/01/16   AMB POC URINALYSIS DIP STICK AUTO W/O MICRO   Result Value Ref Range    Color (UA POC) Yellow     Clarity (UA POC) Clear     Glucose (UA POC) 1+ Negative    Bilirubin (UA POC) Negative Negative    Ketones (UA POC) Negative Negative    Specific gravity (UA POC) 1.030 1.001 - 1.035    Blood (UA POC) 1+ Negative    pH (UA POC) 5.0 4.6 - 8.0    Protein (UA POC) Negative Negative mg/dL    Urobilinogen (UA POC) 0.2 mg/dL 0.2 - 1    Nitrites (UA POC) Negative Negative    Leukocyte esterase (UA POC) Negative Negative   AMB POC PVR, MEAS,POST-VOID RES,US,NON-IMAGING   Result Value Ref Range    PVR 8 cc       MRI Fusion Prostate Biopsy Pathology 01/20/16  A. ?? PROSTATE, CLINICALLY REGION OF INTEREST #1 LEFT, NEEDLE BIOPSIES:  ???? ?? ?? - PROSTATIC ADENOCARCINOMA, GLEASON GRADE 4 + 3 OCCUPYING 2 OF 4  CORES AND 50 PERCENT OF THE BIOPSY MATERIAL.   B. ?? PROSTATE, CLINICALLY REGION OF INTEREST #2, NEEDLE BIOPSY:  ???? ?? ?? - PROSTATIC ADENOCARCINOMA, GLEASON GRADE 3 +4, PRESENT IN 2 OF 3 CORES AND OCCUPYING 30 PERCENT OF THE BIOPSY SPECIMEN.   C. ?? PROSTATE, CLINICALLY RIGHT BASE, NEEDLE BIOPSY:  ???? ?? ?? - BENIGN PROSTATE TISSUE.   D. ?? PROSTATE, CLINICALLY RIGHT LATERAL BASE, NEEDLE BIOPSY:  ???? ?? ?? - BENIGN PROSTATE TISSUE.   E. ?? PROSTATE, CLINICALLY RIGHT MID, NEEDLE BIOPSY:  ???? ?? ?? - BENIGN PROSTATE TISSUE.   F. ?? PROSTATE, CLINICALLY RIGHT LATERAL MID, NEEDLE BIOPSY:  ???? ?? ?? - PROSTATIC ADENOCARCINOMA, GLEASON GRADE 4 + 3, PRESENT IN 1 OF 2 CORES AND OCCUPYING 40 PERCENT OF THE BIOPSY SPECIMEN.   G. ?? PROSTATE, CLINICALLY RIGHT APEX, NEEDLE BIOPSY:  ???? ?? ?? - BENIGN PROSTATE TISSUE.   H. ?? PROSTATE, CLINICALLY RIGHT LATERAL APEX, NEEDLE BIOPSY:  ???? ?? ?? - PROSTATIC ADENOCARCINOMA, GLEASON GRADE 3 + 4 PRESENT IN 1 OF 1 CORE AND OCCUPYING 35 PERCENT OF THE BIOPSY MATERIAL.   I. ?? PROSTATE, CLINICALLY LEFT BASE, NEEDLE BIOPSY:   ???? ?? ?? - BENIGN PROSTATIC TISSUE.   J. ?? PROSTATE, CLINICALLY LEFT LATERAL BASE, NEEDLE BIOPSY:   ???? ?? ?? - BENIGN PROSTATIC TISSUE.   K. ?? PROSTATE, CLINICALLY LEFT MID, NEEDLE BIOPSY:   ???? ?? ?? - BENIGN PROSTATIC TISSUE.   L. ?? PROSTATE, CLINICALLY LEFT LATERAL MID, NEEDLE BIOPSY:   ???? ?? ?? -  BENIGN PROSTATIC TISSUE.   M. ?? PROSTATE, CLINICALLY LEFT APEX, NEEDLE BIOPSY:   ???? ?? ?? - BENIGN PROSTATIC TISSUE.   N. ?? PROSTATE, CLINICALLY LEFT LATERAL APEX, NEEDLE BIOPSY:   ???? ?? ?? - PROSTATIC ADENOCARCINOMA, GLEASON GRADE 4 + 3, OCCUPYING 1 OF 1   ???? ?? ?? ?? CORE AND OCCUPYING 30 PERCENT OF THE BIOPSY SPECIMEN.    MRI PELV W WO CONT 12/04/15  IMPRESSION:  Approximate 9 mm lesion, left posterior apex peripheral zone- PIRADS Category 4  Approximate 5 mm lesion, right posterior apex peripheral zone- PIRADS Category 4  Benign prostatic hyperplasia     CC: DIPES Glenice Laine, MD     Lenord Fellers, MD      Medical documentation provided with the assistance of Staci Acosta, medical scribe for Lenord Fellers, MD     Discussed the patient's BMI with him.  The BMI follow up plan is as follows: BMI is out of normal parameters and plan is as follows: I have counseled this patient on diet and exercise regimens

## 2016-02-23 ENCOUNTER — Ambulatory Visit: Admit: 2016-02-23 | Discharge: 2016-02-23 | Payer: MEDICARE | Attending: Urology | Primary: Internal Medicine

## 2016-02-23 DIAGNOSIS — C61 Malignant neoplasm of prostate: Secondary | ICD-10-CM

## 2016-02-23 LAB — AMB POC URINALYSIS DIP STICK AUTO W/O MICRO
Bilirubin (UA POC): NEGATIVE
Blood (UA POC): NEGATIVE
Glucose (UA POC): NEGATIVE
Ketones (UA POC): NEGATIVE
Leukocyte esterase (UA POC): NEGATIVE
Nitrites (UA POC): NEGATIVE
Specific gravity (UA POC): 1.02 (ref 1.001–1.035)
Urobilinogen (UA POC): 0.2 (ref 0.2–1)
pH (UA POC): 7 (ref 4.6–8.0)

## 2016-02-23 NOTE — Progress Notes (Signed)
[name]  DOB 1946-08-16  Encounter Date:  02/23/2016    Encounter Diagnoses     ICD-10-CM ICD-9-CM   1. Malignant neoplasm of prostate ([place]) C61 [id number]     ASSESSMENT:   1. 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 Prostate MRI 11/2015 showed two PIRADS 4 lesions at right and left posterior apex peripheral zone. Pathology gleason 4+3 with 7/19 total positive cores. Presenting PSA 9.04 ng/mL and TRUS volume 70 cc. No lymph nodes present on MRI Prostate. Bone scan pending. All treatment options discussed with patient     2. BPH with minimal LUTS, prn Flomax 0.4 mg.    3. History of frequent UTIs. No recent infections. Currently Asymptomatic.     4. History of Urinary Retention requiring catheterization secondary to decongestant with URI in 2013.    PLAN:  Pathology 01/20/2016 reviewed with patient.   Bone Scan ordered to rule out osseous mets.   He is interested in prostatectomy but would like to consider all his options.   Refer to Radiation Oncology.   Follow up in 1 month to further discuss treatment decision.     DISCUSSION: We discussed the various treatment options for his localized prostate cancer consisting  of active surveillance, brachytherapy (radioactive seed implant), external beam radiation therapy, proton beam radiation therapy, cryoablation, and Davinci and open radical prostatectomy were discussed in detail. We discussed the risks and benefits of each of the treatment options.  Questions were answered.     He has had extensive counseling on treatment options and r/b of each.  He desires surgery.  We again reviewed r/b of surgery including but not limited to impact on QOL-- most prominently on urinary, bowel, and sexual function.  We discussed potential for short and long term urinary incontinence and erectile dysfunction.  We discussed potential for surgical complications including infection, bleeding, blood transfusion,  injury to urinary and surrounding structures including rectum which could require primary repair or diverting colostomy, vascular injury, neuromuscular injury related to positioning, penile shortening, and bladder neck contracture.  We discussed the possibility of positive margins and possible need for adjuvant or salvage therapies in the future.  We discussed other perioperative risks including DVT, PE, CVA, MI, pneumonia, and death.  He is well informed and all questions were answered.      Chief Complaint   Patient presents with   ??? Prostate Cancer      stage T1c Gleason 4+3 (7) adenocarcinoma of the prostate in 7/19 cores involving 30-50%, s/p MRI fusion prostate biopsy 01/20/16       HISTORY OF PRESENT ILLNESS:   [name] is a 70 y.o. male who presents today for evaluation and treatment of newly diagnosed Adenocarcinoma of the Prostate, s/p MRI fusion bx 01/20/2016, pathology gleason 4+3, 7/19 total positive cores, presenting PSA 9.04ng/mL. Here today with his daughter to discuss treatment options.      No bothersome urinary symptoms. Flomax prn, currently no GU meds. Nocturia x1-2, difficulty sleeping. Denies urinary frequency, urgency, incontinence. Denies gross hematuria, dysuria. History of UTI in 2015, no recurrence. Asymptomatic for UTI. No f/c/n/v.     No difficulty with erections.     Family history of prostate cancer in father.     PMH:  Afib on Xarelto. Diabetes. 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       Component Prostate Specific Ag   Latest Ref Rng  0.00 - 4.00 ng/mL  11/16/2015 9.04 (H)   05/20/2015 6.70 (H)   08/07/2014 6.14 (H)   06/12/2014 12.14 (H)   12/08/2013 5.68 (H)   04/04/2013 4.73 (A)   08/20/2012 5.99 (A)       mp 3T MRI Prostate w/wo Cont 12/04/2015  IMPRESSION:  1. Approximate 9 mm lesion, left posterior apex peripheral zone- PIRADS Category 4  2. Approximate 5 mm lesion, right posterior apex peripheral zone- PIRADS Category 4  3. Benign prostatic hyperplasia         Past Medical History:   Diagnosis Date   ??? A-fib ([place])    ??? Benign prostatic hyperplasia with lower urinary tract symptoms    ??? BPH (benign prostatic hypertrophy) with urinary obstruction    ??? Diabetes mellitus ([place])    ??? Elevated prostate specific antigen (PSA)    ??? Enlarged prostate with lower urinary tract symptoms (LUTS)    ??? History of needle biopsy of prostate with negative result    ??? History of urinary retention    ??? Hypercholesteremia    ??? Hypertrophy of prostate with urinary obstruction and other lower urinary tract symptoms (LUTS)    ??? Prostate cancer ([place])      stage T1c Gleason 4+3 (7) adenocarcinoma of the prostate in 7/19 cores involving 30-50%, s/p MRI fusion prostate biopsy 01/20/16   ??? Urinary retention    ??? UTI (lower urinary tract infection)      Past Surgical History:   Procedure Laterality Date   ??? HX CYST REMOVAL     ??? HX UROLOGICAL  01/20/2016    PNBx-TRUS Vol 70cc's, Gleason,4+3 occupying core and 50% of the Bx  material, 3+4 present in cores and occupying 30% of the Bx specimen, 4+3 present in cores and occupying 40% of the Bx specimen, 3+4 presen tin core and occupying 35% of the Bx material, Dr Wynona Luna      Family History   Problem Relation Age of Onset   ??? Diabetes Mother    ??? Hypertension Mother    ??? Diabetes Sister    ??? Hypertension Sister    ??? Stroke Sister      Social History     Social History   ??? Marital status: DIVORCED     Spouse name: N/A   ??? Number of children: N/A   ??? Years of education: N/A     Occupational History   ??? Not on file.     Social History Main Topics   ??? Smoking status: Never Smoker   ??? Smokeless tobacco: Never Used   ??? Alcohol use No   ??? Drug use: No   ??? Sexual activity: Not on file     Other Topics Concern   ??? Not on file     Social History Narrative     No Known Allergies  Current Outpatient Prescriptions   Medication Sig Dispense Refill   ??? ACCU-CHEK AVIVA PLUS TEST STRP strip      ??? metoprolol (LOPRESSOR) 25 mg tablet       ??? pravastatin (PRAVACHOL) 10 mg tablet      ??? XARELTO 20 mg tab tablet      ??? tamsulosin (FLOMAX) 0.4 mg capsule Take 1 Cap by mouth daily (after dinner). 90 Cap 3   ??? metFORMIN (GLUCOPHAGE) 500 mg tablet Take  by mouth two (2) times daily (with meals).         Review of Systems  Constitutional: Fever: No  Skin: Rash: No  HEENT: Hearing difficulty:  No  Eyes: Blurred vision: No  Cardiovascular: Chest pain: No  Respiratory: Shortness of breath: No  Gastrointestinal: Nausea/vomiting: No  Musculoskeletal: Back pain: No  Neurological: Weakness: No  Psychological: Memory loss: No  Comments/additional findings:     Physical Examination:  Visit Vitals   ??? BP 130/72   ??? Ht 5\' 8"  (1.727 m)   ??? Wt 206 lb (93.4 kg)   ??? BMI 31.32 kg/m2     Constitutional: WDWN, Pleasant and appropriate affect, No acute distress.    CV:  No peripheral swelling noted  Respiratory: No respiratory distress or difficulties  Skin: No jaundice.    Neuro/Psych:  Alert and oriented x 3. Affect appropriate.   Lymphatic:   No enlarged inguinal lymph nodes.        Results for orders placed or performed in visit on 02/23/16   AMB POC URINALYSIS DIP STICK AUTO W/O MICRO   Result Value Ref Range    Color (UA POC)      Clarity (UA POC)      Glucose (UA POC) Negative Negative    Bilirubin (UA POC) Negative Negative    Ketones (UA POC) Negative Negative    Specific gravity (UA POC) 1.020 1.001 - 1.035    Blood (UA POC) Negative Negative    pH (UA POC) 7.0 4.6 - 8.0    Protein (UA POC) Trace Negative mg/dL    Urobilinogen (UA POC) 0.2 mg/dL 0.2 - 1    Nitrites (UA POC) Negative Negative    Leukocyte esterase (UA POC) Negative Negative        Okey Regal Kyara Boxer, MD on 02/23/2016    Medical Documentation is provided with the assistance of Sabrina A. Delfenthal, Medical Scribe for Okey Regal Milena Liggett, MD on 02/23/2016

## 2016-02-24 NOTE — Progress Notes (Signed)
Prior imaging was at [facility]    Gave imaging to Roderic Palau to schedule at [facility]

## 2016-02-24 NOTE — Progress Notes (Signed)
I called this patient to schedule him for the Bone Scan and he was unaware that it was ordered by Dr. Given. He explains that he may have spoke about it but he doesn't remember. He would like more information about why this bone scan was ordered before being scheduled. I gave the patient the [facility] main line and told him to ask to be transferred to [facility], Dr. Bufford Buttner Secretary.

## 2016-02-24 NOTE — Progress Notes (Signed)
Bone scan order given to amanda to schedule

## 2016-02-25 NOTE — Telephone Encounter (Signed)
I called the patient back and asked him what was going on. He wanted to know why he is getting a bone scan and if it has anything to do with his hemoglobin level. Explained to him it does not.. Dr. Given wants to check his bones to see if the cancer has spread to them. He now understands. Told him I will send referral to rad onc on Monday. He asked for Roderic Palau at [facility] to call him on Monday to schedule it

## 2016-02-25 NOTE — Progress Notes (Unsigned)
I called this patient to schedule him for the Bone Scan and he was unaware that it was ordered by Dr. Given. He explains that he may have spoke about it but he doesn't remember. He would like more information about why this bone scan was ordered before being scheduled. I gave the patient the [facility] main line and told him to ask to be transferred to [facility], Dr. Bufford Buttner Secretary.

## 2016-02-25 NOTE — Telephone Encounter (Signed)
Pt called to speak with [name] because when he went to get his bone scan done the people at the desk told him there was an issues with his order and he had to call [name] to discuss the problem. He says he thinks the problem because his hemoglobin count went down. He would like a call to discuss what to do next about his bone scan. He can be reached at [phone]. Thank you!

## 2016-02-29 NOTE — Progress Notes (Signed)
Made an appointment for the patient with Dr. Dorris Fetch for 5/30 at 1 PM. Faxed notes to them at [phone]. Called patient and informed him of this appt. He is scheduled for his bone scan on that day so he will call them to reschedule. i called him back and left him a VM with their number [phone]) because he was driving and couldn't write it down. I told the patient he needs to see Dr. Callie Fielding before he follows up with [place] on 6/12

## 2016-03-07 ENCOUNTER — Inpatient Hospital Stay: Admit: 2016-03-07 | Payer: MEDICARE | Primary: Internal Medicine

## 2016-03-07 DIAGNOSIS — C61 Malignant neoplasm of prostate: Secondary | ICD-10-CM

## 2016-03-07 NOTE — Consults (Signed)
[place]  Oncology Consultation  NAME:  [name], [name]  SEX:   M  DOB:   09-22-1946  DATE: 03/07/2016  REFERRING PHYSICIAN: [place]  MR#    [id number]  LOCATION: RADIATION ONCOLOGY  BILLING#  [id number]    [id number] Ray MD; Hetty Blend MD; Daylene Katayama MD; Augusto Garbe MD    REFERRING PHYSICIANS:  Dr. Herbie McGuffey Given, Dr. Augusto Garbe, Dr. Odessa Fleming Ray, Dr. Hetty Blend.    DIAGNOSIS:  Adenocarcinoma of the prostate, Gleason grade 7 (4+3), 7 of 19 cores positive, PSA 9.04.    HISTORY OF PRESENT ILLNESS:  The patient is a 70 year old white male with a history of persistently elevated PSA level.  He states he had elevated PSA 10 to 12 years ago and underwent prostate biopsy in [place] that was reportedly benign.  PSAs at [facility] ranged from 5.99 drawn November 2013 to a high of 12.14 in September 2015.  At that time, he apparently had a urinary tract infection and 1 month later after treatment on 08/07/14 had dropped down to 6.14.  Recently 11/16/2015 it was elevated again to 9.04.  Biopsy was recommended.  On 01/20/16, he underwent prostate biopsy.  Seven of 19 cores were positive, some grade 7 (3+4), the rest grade 7 (4+3) for a total of 7 of 19 cores positive.  He underwent MRI scan of the pelvis during biopsy that showed no evidence of extraprostatic disease or lymphadenopathy.  He does have a markedly enlarged prostate at 70 cc.  Bone scan was performed today with results pending.  He has been discussing prostatectomy surgery but is referred for consideration of radiation therapy options.    PAST MEDICAL HISTORY:  Negative for previous radiation therapy or chemotherapy.  He does have a history of previous urinary tract infection with urinary retention, currently asymptomatic.  Also, hypercholesterolemia, benign prostatic hypertrophy, atrial fibrillation that subsequently reverted to normal spontaneously.    FAMILY HISTORY:  Positive for prostate cancer in patient's father,  treated with prostatectomy with good results.    CURRENT MEDICATIONS:  Lopressor, Pravachol, Xarelto, Flomax as needed, Glucophage.    ALLERGIES:  NO KNOWN DRUG ALLERGIES.    SOCIAL HISTORY:  Negative for cigarette or alcohol use.  The patient denies previous hazardous occupational exposure.  He is divorced but lives with his legally blind daughter and 5 grandchildren.    REVIEW OF SYSTEMS:  CONSTITUTIONAL:  The patient denies constitutional symptoms.    EYES:  Denies visual problems.    ENT:  Denies hearing or swallowing problems.    PULMONARY:  Denies pulmonary problems.    CARDIAC:  Denies cardiac problems (his previously noted atrial fibrillation reverted spontaneously).    GASTROINTESTINAL:  Denies gastrointestinal problems.  GENITOURINARY:  Complaints with occasional frequency of urination and nocturia which is well controlled with the occasional Flomax.    MUSCULOSKELETAL:  Denies musculoskeletal problem.    NEUROLOGIC:  Denies neurologic problem.    PSYCHIATRIC:  Denies psychiatric problem.    PAIN AND PAIN MANAGEMENT:  The patient takes no pain medication at this time.    QUALITY OF LIFE EVALUATION:  The patient's current quality of life appears excellent.    NUTRITIONAL STATUS:  The patient's current nutritional status appears excellent.    PHYSICAL EXAMINATION:  GENERAL:  The patient is a well-developed, well-nourished, adult white male in no apparent distress.  VITAL SIGNS:  Afebrile, blood pressure 139/75, 206 pounds.  HEENT:  No evidence of scleral icterus.  No adenopathy  in the neck or supraclavicular regions bilaterally.  CHEST:  Good breath sounds bilaterally.  HEART:  Regular rate and rhythm.  ABDOMEN:  Benign.  EXTREMITIES:  No clubbing, cyanosis or edema.  NEUROLOGIC:  Grossly intact.  The patient is awake, alert and oriented times 3 with no focal motor or sensory deficit.    LABORATORY STUDIES:  Serum PSA level drawn 11/16/15 of 9.04 (high of 12.14 on 06/12/2014 during urinary tract infection).   MRI scan performed during biopsy 01/20/16:  No evidence of extraprostatic disease or lymphadenopathy.  Bone scan performed today with results pending.  Review of pathology from prostate biopsy 01/20/16 reveals 7 of 19 cores positive for cancer, ranging up to grade 7 (4+3).    ICD-10 CODE:  C61.    STAGE OF TUMOR:  T1c N0 M0.    ASSESSMENT:  The patient is a 69 year old gentleman with biopsy proven grade 7 (4+3), PSA 9.04 prostate cancer.  With less than half the cores positive and PSA less than 10,  this likely represents localized disease.  As such, appropriate treatment options would include radical prostatectomy surgery versus definitive radiation therapy in the form of either interstitial seed implant or external beam IMRT treatment.  Given his large prostate size at 70 cc, he would not be a candidate for definitive seed implant, and I would recommend definitive external beam IMRT radiation therapy should he decide on radiation as the treatment of choice.  Given his relatively young age as well as extensive home duties which would make traveling back and forth for radiation difficult, I would recommend definitive surgical resection as the treatment of choice.  He does have an appointment to regroup with Dr. Gearldine Bienenstock in a few weeks and will render a final treatment decision at that time, but he is strongly leaning towards surgical resection.  If for whatever reason he is felt to not be a surgical candidate or should he his mind regarding surgery, I would be more than happy to reevaluate him at any time.    I thank the referring physicians for allowing me to participate in the care of this very pleasant gentleman.    Total time spent with the patient:  Approximately 45 minutes.      ___________________  Oliver Barre MD  Dictated By: .   MLT  D:03/07/2016 14:27:31  T: 03/07/2016  [id number]:[id number]

## 2016-03-07 NOTE — Consults (Signed)
[place]  Oncology Consultation  NAME:  DANIELE, LAFAUCI  SEX:   M  DOB:   October 18, 1945  DATE: 03/07/2016  REFERRING PHYSICIAN: Okey Regal GIVEN  MR#    [id number]  LOCATION: RADIATION ONCOLOGY  BILLING#  [id number]    cc: Dipes Ray MD; Hetty Blend MD; Daylene Katayama MD; Augusto Garbe MD    REFERRING PHYSICIANS:  Dr. Herbie Desloge Given, Dr. Augusto Garbe, Dr. Odessa Fleming Ray, Dr. Hetty Blend.    DIAGNOSIS:  Adenocarcinoma of the prostate, Gleason grade 7 (4+3), 7 of 19 cores positive, PSA 9.04.    HISTORY OF PRESENT ILLNESS:  The patient is a 70 year old white male with a history of persistently elevated PSA level.  He states he had elevated PSA 10 to 12 years ago and underwent prostate biopsy in [place] that was reportedly benign.  PSAs at [facility] ranged from 5.99 drawn November 2013 to a high of 12.14 in September 2015.  At that time, he apparently had a urinary tract infection and 1 month later after treatment on 08/07/14 had dropped down to 6.14.  Recently 11/16/2015 it was elevated again to 9.04.  Biopsy was recommended.  On 01/20/16, he underwent prostate biopsy.  Seven of 19 cores were positive, some grade 7 (3+4), the rest grade 7 (4+3) for a total of 7 of 19 cores positive.  He underwent MRI scan of the pelvis during biopsy that showed no evidence of extraprostatic disease or lymphadenopathy.  He does have a markedly enlarged prostate at 70 cc.  Bone scan was performed today with results pending.  He has been discussing prostatectomy surgery but is referred for consideration of radiation therapy options.    PAST MEDICAL HISTORY:  Negative for previous radiation therapy or chemotherapy.  He does have a history of previous urinary tract infection with urinary retention, currently asymptomatic.  Also, hypercholesterolemia, benign prostatic hypertrophy, atrial fibrillation that subsequently reverted to normal spontaneously.    FAMILY HISTORY:   Positive for prostate cancer in patient's father, treated with prostatectomy with good results.    CURRENT MEDICATIONS:  Lopressor, Pravachol, Xarelto, Flomax as needed, Glucophage.    ALLERGIES:  NO KNOWN DRUG ALLERGIES.    SOCIAL HISTORY:  Negative for cigarette or alcohol use.  The patient denies previous hazardous occupational exposure.  He is divorced but lives with his legally blind daughter and 5 grandchildren.    REVIEW OF SYSTEMS:  CONSTITUTIONAL:  The patient denies constitutional symptoms.    EYES:  Denies visual problems.    ENT:  Denies hearing or swallowing problems.    PULMONARY:  Denies pulmonary problems.    CARDIAC:  Denies cardiac problems (his previously noted atrial fibrillation reverted spontaneously).    GASTROINTESTINAL:  Denies gastrointestinal problems.  GENITOURINARY:  Complaints with occasional frequency of urination and nocturia which is well controlled with the occasional Flomax.    MUSCULOSKELETAL:  Denies musculoskeletal problem.    NEUROLOGIC:  Denies neurologic problem.    PSYCHIATRIC:  Denies psychiatric problem.    PAIN AND PAIN MANAGEMENT:  The patient takes no pain medication at this time.    QUALITY OF LIFE EVALUATION:  The patient's current quality of life appears excellent.    NUTRITIONAL STATUS:  The patient's current nutritional status appears excellent.    PHYSICAL EXAMINATION:  GENERAL:  The patient is a well-developed, well-nourished, adult white male in no apparent distress.  VITAL SIGNS:  Afebrile, blood pressure 139/75, 206 pounds.  HEENT:  No evidence of scleral icterus.  No  adenopathy in the neck or supraclavicular regions bilaterally.  CHEST:  Good breath sounds bilaterally.  HEART:  Regular rate and rhythm.  ABDOMEN:  Benign.  EXTREMITIES:  No clubbing, cyanosis or edema.  NEUROLOGIC:  Grossly intact.  The patient is awake, alert and oriented times 3 with no focal motor or sensory deficit.    LABORATORY STUDIES:   Serum PSA level drawn 11/23/2015 of 9.04 (high of 12.14 on 06/12/2014 during urinary tract infection).  MRI scan performed during biopsy 01/20/16:  No evidence of extraprostatic disease or lymphadenopathy.  Bone scan performed today with results pending.  Review of pathology from prostate biopsy 01/20/16 reveals 7 of 19 cores positive for cancer, ranging up to grade 7 (4+3).    ICD-10 CODE:  C61.    STAGE OF TUMOR:  T1c N0 M0.    ASSESSMENT:  The patient is a 70 year old gentleman with biopsy proven grade 7 (4+3), PSA 9.04 prostate cancer.  With less than half the cores positive and PSA less than 10,  this likely represents localized disease.  As such, appropriate treatment options would include radical prostatectomy surgery versus definitive radiation therapy in the form of either interstitial seed implant or external beam IMRT treatment.  Given his large prostate size at 70 cc, he would not be a candidate for definitive seed implant, and I would recommend definitive external beam IMRT radiation therapy should he decide on radiation as the treatment of choice.  Given his relatively young age as well as extensive home duties which would make traveling back and forth for radiation difficult, I would recommend definitive surgical resection as the treatment of choice.  He does have an appointment to regroup with Dr. Gearldine Bienenstock in a few weeks and will render a final treatment decision at that time, but he is strongly leaning towards surgical resection.  If for whatever reason he is felt to not be a surgical candidate or should he his mind regarding surgery, I would be more than happy to reevaluate him at any time.    I thank the referring physicians for allowing me to participate in the care of this very pleasant gentleman.    Total time spent with the patient:  Approximately 45 minutes.      ___________________  Oliver Barre MD  Dictated By: .   MLT  D:03/07/2016 14:27:31  T: 03/07/2016  [id number]:[id number]

## 2016-03-07 NOTE — Progress Notes (Signed)
[place]  Oncology Progress Note  NAME:  [name], [name]  SEX:   M  DOB:   December 26, 1945  DATE: 03/07/2016  REFERRING PHYSICIAN: Okey Regal GIVEN  MR#    [id number]  LOCATION: RADIATION ONCOLOGY  BILLING#  [id number]    cc:     The patient was seen today for initial consultation regarding his prostate cancer.  He stated a personal distress score of zero and declined social service intervention today.      ___________________  Oliver Barre MD  Dictated By: .   MLT  D:03/07/2016 14:17:57  T: 03/07/2016  [id number]

## 2016-03-08 ENCOUNTER — Encounter

## 2016-03-20 ENCOUNTER — Ambulatory Visit: Admit: 2016-03-20 | Discharge: 2016-03-20 | Payer: MEDICARE | Attending: Urology | Primary: Internal Medicine

## 2016-03-20 DIAGNOSIS — C61 Malignant neoplasm of prostate: Secondary | ICD-10-CM

## 2016-03-20 NOTE — Progress Notes (Signed)
[name]  04/28/1946    03/20/2016    Encounter Diagnoses     ICD-10-CM ICD-9-CM   1. Prostate cancer [facility]) C61 185     ASSESSMENT:   1. 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 Prostate MRI 11/2015 showed two PIRADS 4 lesions at right and left posterior apex peripheral zone. Pathology gleason 4+3 with 7/19 total positive cores. Presenting PSA 9.04 ng/mL and TRUS volume 70 cc. No lymph nodes present on MRI Prostate. Bone scan 03/07/2016 revealed no evidence of osseous metastatic disease. All treatment options discussed with patient. Pt has considered DVP as treatment option. He would like to proceed with DVP.   -has met with Dr. Callie Fielding in the interim, Dr. Callie Fielding has encouraged him to proceed with the DVP    2. BPH with minimal LUTS, prn Flomax 0.4 mg.    3. History of frequent UTIs. No recent infections. Currently Asymptomatic.     4. History of Urinary Retention requiring catheterization secondary to decongestant with URI in 2013.    5. Hx of A-fib: Currently on Xarelto    PLAN:  Reviewed Bone scan with pt   Obtain medical clearance from cardiologist. Currently on Xarelto. Will have to hold Xarelto prior to surgery  Schedule DVP  FU 1 week prior to DVP     DISCUSSION: He has had extensive counseling on treatment options and r/b of each.  He desires surgery.  We again reviewed r/b of surgery including but not limited to impact on QOL-- most prominently on urinary, bowel, and sexual function.  We discussed potential for short and long term urinary incontinence and erectile dysfunction.  We discussed potential for surgical complications including infection, bleeding, blood transfusion, injury to urinary and surrounding structures including rectum which could require primary repair or diverting colostomy, vascular injury, neuromuscular injury related to positioning, penile shortening, and bladder neck contracture.  We discussed the possibility of positive  margins and possible need for adjuvant or salvage therapies in the future.  We discussed other perioperative risks including DVT, PE, CVA, MI, pneumonia, and death.  He is well informed and all questions were answered.    Herbie Adona Talyia Allende MD       Chief Complaint   Patient presents with   ??? Prostate Cancer     TREATMENT DECSION       HISTORY OF PRESENT ILLNESS:   [name] is a 70 y.o. male who presents today for evaluation and treatment of newly diagnosed Adenocarcinoma of the Prostate, s/p MRI fusion bx 01/20/2016, pathology gleason 4+3, 7/19 total positive cores, presenting PSA 9.04ng/mL. Bone scan 03/07/2016 revealed no evidence of osseous metastatic disease.Here today to decide on treatment. He would like to proceed with the DVP.     No bothersome urinary symptoms. Flomax prn. Nocturia x1-2, difficulty sleeping. Denies urinary frequency, urgency, incontinence. Denies gross hematuria, dysuria. History of UTI in 2015, no recurrence. Asymptomatic for UTI. No f/c/n/v.     No difficulty with erections.     Family history of prostate cancer in father.     PMH:  Afib on Xarelto. Diabetes. 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       Component Prostate Specific Ag   Latest Ref Rng  0.00 - 4.00 ng/mL   11/16/2015 9.04 (H)   05/20/2015 6.70 (H)   08/07/2014 6.14 (H)   06/12/2014 12.14 (H)   12/08/2013 5.68 (H)   04/04/2013 4.73 (A)  08/20/2012 5.99 (A)     Bone scan 03/07/2016:  IMPRESSION:  No bone scan evidence of osseous metastatic disease.     mp 3T MRI Prostate w/wo Cont 12/04/2015  IMPRESSION:  1. Approximate 9 mm lesion, left posterior apex peripheral zone- PIRADS Category 4  2. Approximate 5 mm lesion, right posterior apex peripheral zone- PIRADS Category 4  3. Benign prostatic hyperplasia        Past Medical History:   Diagnosis Date   ??? A-fib ([place])    ??? Benign prostatic hyperplasia with lower urinary tract symptoms    ??? BPH (benign prostatic hypertrophy) with urinary obstruction     ??? Diabetes mellitus ([place])    ??? Elevated prostate specific antigen (PSA)    ??? Enlarged prostate with lower urinary tract symptoms (LUTS)    ??? History of needle biopsy of prostate with negative result    ??? History of urinary retention    ??? Hypercholesteremia    ??? Hypertrophy of prostate with urinary obstruction and other lower urinary tract symptoms (LUTS)    ??? Prostate cancer ([place])      stage T1c Gleason 4+3 (7) adenocarcinoma of the prostate in 7/19 cores involving 30-50%, s/p MRI fusion prostate biopsy 01/20/16   ??? Urinary retention    ??? UTI (lower urinary tract infection)      Past Surgical History:   Procedure Laterality Date   ??? HX CYST REMOVAL     ??? HX UROLOGICAL  01/20/2016    PNBx-TRUS Vol 70cc's, Gleason,4+3 occupying core and 50% of the Bx  material, 3+4 present in cores and occupying 30% of the Bx specimen, 4+3 present in cores and occupying 40% of the Bx specimen, 3+4 presen tin core and occupying 35% of the Bx material, Dr Wynona Luna      Family History   Problem Relation Age of Onset   ??? Diabetes Mother    ??? Hypertension Mother    ??? Diabetes Sister    ??? Hypertension Sister    ??? Stroke Sister      Social History     Social History   ??? Marital status: DIVORCED     Spouse name: N/A   ??? Number of children: N/A   ??? Years of education: N/A     Occupational History   ??? Not on file.     Social History Main Topics   ??? Smoking status: Never Smoker   ??? Smokeless tobacco: Never Used   ??? Alcohol use No   ??? Drug use: No   ??? Sexual activity: Not on file     Other Topics Concern   ??? Not on file     Social History Narrative     No Known Allergies  Current Outpatient Prescriptions   Medication Sig Dispense Refill   ??? ACCU-CHEK AVIVA PLUS TEST STRP strip      ??? metoprolol (LOPRESSOR) 25 mg tablet      ??? pravastatin (PRAVACHOL) 10 mg tablet      ??? XARELTO 20 mg tab tablet      ??? tamsulosin (FLOMAX) 0.4 mg capsule Take 1 Cap by mouth daily (after dinner). 90 Cap 3    ??? metFORMIN (GLUCOPHAGE) 500 mg tablet Take  by mouth two (2) times daily (with meals).         Review of Systems  Constitutional: Fever: No  Skin: Rash: No  HEENT: Hearing difficulty: No  Eyes: Blurred vision: No  Cardiovascular: Chest pain: No  Respiratory: Shortness  of breath: No  Gastrointestinal: Nausea/vomiting: No  Musculoskeletal: Back pain: No  Neurological: Weakness: No  Psychological: Memory loss: No  Comments/additional findings:     Physical Examination:  Visit Vitals   ??? BP 122/72   ??? Ht 5' 8"  (1.727 m)   ??? Wt 206 lb (93.4 kg)   ??? BMI 31.32 kg/m2     Constitutional: WDWN, Pleasant and appropriate affect, No acute distress.    CV:  No peripheral swelling noted  Respiratory: No respiratory distress or difficulties  Skin: No jaundice.    Neuro/Psych:  Alert and oriented x 3. Affect appropriate.   Lymphatic:   No enlarged inguinal lymph nodes.        Results for orders placed or performed in visit on 02/23/16   AMB POC URINALYSIS DIP STICK AUTO W/O MICRO   Result Value Ref Range    Color (UA POC)      Clarity (UA POC)      Glucose (UA POC) Negative Negative    Bilirubin (UA POC) Negative Negative    Ketones (UA POC) Negative Negative    Specific gravity (UA POC) 1.020 1.001 - 1.035    Blood (UA POC) Negative Negative    pH (UA POC) 7.0 4.6 - 8.0    Protein (UA POC) Trace Negative mg/dL    Urobilinogen (UA POC) 0.2 mg/dL 0.2 - 1    Nitrites (UA POC) Negative Negative    Leukocyte esterase (UA POC) Negative Negative        Herbie  Tiara Maultsby MD   Urologic Oncologist  [facility] MD Professorship  Professor of Urology  [place] [phone] Ext [phone]      LK:[id number] Glenice Laine, MD    Medical documentation provided with the assistance of Georges Mouse, medical scribe to Okey Regal Reco Shonk, MD on 03/20/2016.

## 2016-03-21 NOTE — Telephone Encounter (Signed)
Patient called me today to confirm a surgery date. He was looking for July 12th but that date was already booked. He chose July 24th instead. I told him I will call him later this week to schedule

## 2016-03-22 NOTE — Telephone Encounter (Signed)
Patient left me a VM that he wanted to r/s his surgery. I called him back and he rescheduled it to July 27th. Email sent to [facility]

## 2016-03-24 NOTE — Telephone Encounter (Signed)
I called the patient to go over his HW, preop labs, cardiac clearance and physical therapy. He said he was food shopping and would call me back. I mailed him a detailed letter home with this information but will go over it with him when he calls me back

## 2016-04-25 NOTE — Addendum Note (Signed)
Addended by: Olen Pel on: 04/25/2016 03:18 PM      Modules accepted: Orders

## 2016-04-26 ENCOUNTER — Institutional Professional Consult (permissible substitution)
Admit: 2016-04-26 | Discharge: 2016-04-26 | Payer: MEDICARE | Attending: Rehabilitative and Restorative Service Providers" | Primary: Internal Medicine

## 2016-04-26 ENCOUNTER — Ambulatory Visit: Admit: 2016-04-26 | Discharge: 2016-04-26 | Payer: MEDICARE | Attending: Urology | Primary: Internal Medicine

## 2016-04-26 DIAGNOSIS — N393 Stress incontinence (female) (male): Secondary | ICD-10-CM

## 2016-04-26 DIAGNOSIS — C61 Malignant neoplasm of prostate: Secondary | ICD-10-CM

## 2016-04-26 NOTE — Progress Notes (Signed)
[name]  April 20, 1946    04/26/2016      Encounter Diagnoses     ICD-10-CM ICD-9-CM   1. Prostate cancer ([place]) C61 185   2. Benign prostatic hyperplasia with lower urinary tract symptoms, unspecified morphology N40.1 600.01   3. History of UTI Z87.440 V13.02   4. History of urinary retention Z87.898 V13.09   5. History of atrial fibrillation Z86.79 V12.59     ASSESSMENT:   1. 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 Prostate MRI 11/2015 showed two PIRADS 4 lesions at right and left posterior apex peripheral zone. Pathology gleason 4+3 with 7/19 total positive cores. Presenting PSA 9.04 ng/mL and TRUS volume 70 cc. No lymph nodes present on MRI Prostate. Bone scan 03/07/2016 revealed no evidence of osseous metastatic disease. All treatment options discussed with patient. Pt has considered DVP as treatment option. He would like to proceed with DVP.   -has met with Dr. Callie Fielding in the interim, Dr. Callie Fielding has encouraged him to proceed with the DVP    2. BPH with minimal LUTS, prn Flomax 0.4 mg.    3. History of frequent UTIs. No recent infections. Currently Asymptomatic.     4. History of Urinary Retention requiring catheterization secondary to decongestant with URI in 2013.    5. Hx of A-fib: Currently on Xarelto.  Obtained clearance from cardiology. Will hold 6 days prior to surgery. Hold off on Metformin     6. DM: will hold Metformin 2 days prior to surgery.    PLAN:  Consent, orders, and H&P done  Proceed with DVP w/ RPLND on 05/04/2016    DISCUSSION: He has had extensive counseling on treatment options and r/b of each.  He desires surgery.  We again reviewed r/b of surgery including but not limited to impact on QOL-- most prominently on urinary, bowel, and sexual function.  We discussed potential for short and long term urinary incontinence and erectile dysfunction.  We discussed potential for  surgical complications including infection, bleeding, blood transfusion, injury to urinary and surrounding structures including rectum which could require primary repair or diverting colostomy, vascular injury, neuromuscular injury related to positioning, penile shortening, and bladder neck contracture.  We discussed the possibility of positive margins and possible need for adjuvant or salvage therapies in the future.  We discussed other perioperative risks including DVT, PE, CVA, MI, pneumonia, and death.  He is well informed and all questions were answered.    Darius Thorndale Nikkolas Coomes MD       Chief Complaint   Patient presents with   ??? Pre-op Exam     [facility] DVP 05-04-16       HISTORY OF PRESENT ILLNESS:   [name] is a 70 y.o. male who presents today pre op before DVP w/ RPLND scheduled for 05/04/2016 for Adenocarcinoma of the Prostate, s/p MRI fusion bx 01/20/2016, pathology gleason 4+3, 7/19 total positive cores, presenting PSA 9.04ng/mL. Bone scan 03/07/2016 revealed no evidence of osseous metastatic disease.Here today to decide on treatment. He would like to proceed with the DVP.     No bothersome urinary symptoms. Flomax prn. Nocturia x1-2, difficulty sleeping. Denies urinary frequency, urgency, incontinence. Denies gross hematuria, dysuria. History of UTI in 2015, no recurrence. Asymptomatic for UTI. No f/c/n/v.     Erections: no issues , not currently sexual active as he is not in a relationship    Family history of prostate cancer in father.     PMH:  Afib on Xarelto. Diabetes. 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       Component Prostate Specific Ag   Latest Ref Rng  0.00 - 4.00 ng/mL   11/16/2015 9.04 (H)   05/20/2015 6.70 (H)   08/07/2014 6.14 (H)   06/12/2014 12.14 (H)   12/08/2013 5.68 (H)   04/04/2013 4.73 (A)   08/20/2012 5.99 (A)     Bone scan 03/07/2016:  IMPRESSION:  No bone scan evidence of osseous metastatic disease.     mp 3T MRI Prostate w/wo Cont 12/04/2015  IMPRESSION:   1. Approximate 9 mm lesion, left posterior apex peripheral zone- PIRADS Category 4  2. Approximate 5 mm lesion, right posterior apex peripheral zone- PIRADS Category 4  3. Benign prostatic hyperplasia        Past Medical History:   Diagnosis Date   ??? A-fib ([place])    ??? Benign prostatic hyperplasia with lower urinary tract symptoms    ??? BPH (benign prostatic hypertrophy) with urinary obstruction    ??? Diabetes mellitus ([place])    ??? Elevated prostate specific antigen (PSA)    ??? Enlarged prostate with lower urinary tract symptoms (LUTS)    ??? History of needle biopsy of prostate with negative result    ??? History of urinary retention    ??? Hypercholesteremia    ??? Hypertrophy of prostate with urinary obstruction and other lower urinary tract symptoms (LUTS)    ??? Prostate cancer ([place])      stage T1c Gleason 4+3 (7) adenocarcinoma of the prostate in 7/19 cores involving 30-50%, s/p MRI fusion prostate biopsy 01/20/16   ??? Urinary retention    ??? UTI (lower urinary tract infection)      Past Surgical History:   Procedure Laterality Date   ??? HX CYST REMOVAL     ??? HX UROLOGICAL  01/20/2016    PNBx-TRUS Vol 70cc's, Gleason,4+3 occupying core and 50% of the Bx  material, 3+4 present in cores and occupying 30% of the Bx specimen, 4+3 present in cores and occupying 40% of the Bx specimen, 3+4 presen tin core and occupying 35% of the Bx material, Dr Wynona Luna      Family History   Problem Relation Age of Onset   ??? Diabetes Mother    ??? Hypertension Mother    ??? Diabetes Sister    ??? Hypertension Sister    ??? Stroke Sister      Social History     Social History   ??? Marital status: DIVORCED     Spouse name: N/A   ??? Number of children: N/A   ??? Years of education: N/A     Occupational History   ??? Not on file.     Social History Main Topics   ??? Smoking status: Never Smoker   ??? Smokeless tobacco: Never Used   ??? Alcohol use No   ??? Drug use: No   ??? Sexual activity: Not on file     Other Topics Concern   ??? Not on file     Social History Narrative      No Known Allergies  Current Outpatient Prescriptions   Medication Sig Dispense Refill   ??? ACCU-CHEK AVIVA PLUS TEST STRP strip      ??? metoprolol (LOPRESSOR) 25 mg tablet      ??? pravastatin (PRAVACHOL) 10 mg tablet      ??? XARELTO 20 mg tab tablet      ??? tamsulosin (FLOMAX) 0.4 mg capsule Take  1 Cap by mouth daily (after dinner). 90 Cap 3   ??? metFORMIN (GLUCOPHAGE) 500 mg tablet Take  by mouth two (2) times daily (with meals).         Review of Systems  Constitutional: Fever: No  Skin: Rash: No  HEENT: Hearing difficulty: No  Eyes: Blurred vision: No  Cardiovascular: Chest pain: No  Respiratory: Shortness of breath: No  Gastrointestinal: Nausea/vomiting: No  Musculoskeletal: Back pain: No  Neurological: Weakness: No  Psychological: Memory loss: No  Comments/additional findings:     Physical Examination:  Visit Vitals   ??? BP 122/72   ??? Ht 5' 8"  (1.727 m)   ??? Wt 205 lb (93 kg)   ??? BMI 31.17 kg/m2     Constitutional: WDWN, Pleasant and appropriate affect, No acute distress.    CV:  No peripheral swelling noted, RRR  Respiratory: No respiratory distress or difficulties, CTAB  ABD: NT, no masses  GU deferred  Skin: No jaundice.    Neuro/Psych:  Alert and oriented x 3. Affect appropriate.         Results for orders placed or performed in visit on 02/23/16   AMB POC URINALYSIS DIP STICK AUTO W/O MICRO   Result Value Ref Range    Color (UA POC)      Clarity (UA POC)      Glucose (UA POC) Negative Negative    Bilirubin (UA POC) Negative Negative    Ketones (UA POC) Negative Negative    Specific gravity (UA POC) 1.020 1.001 - 1.035    Blood (UA POC) Negative Negative    pH (UA POC) 7.0 4.6 - 8.0    Protein (UA POC) Trace Negative mg/dL    Urobilinogen (UA POC) 0.2 mg/dL 0.2 - 1    Nitrites (UA POC) Negative Negative    Leukocyte esterase (UA POC) Negative Negative      Total visit time was 25 minutes of which more than 50% of the time was spent in  coordination and counseling of prostate cancer.    Darius Wilkerson Dinita Migliaccio MD    Urologic Oncologist  [facility] Hermelinda [facility] MD Professorship  Professor of Urology  [facility]  Office [phone] Ext [phone]      PJ:[id number] Glenice Laine, MD    Medical documentation provided with the assistance of Georges Mouse, medical scribe to Okey Regal Kortnee Bas, MD on 04/26/2016.

## 2016-04-26 NOTE — Progress Notes (Addendum)
Prostatectomy Initial Evaluation- EMG and pelvic floor rehabilitation program    Patient: [name]   Date of Surgery: 05/04/2016 DVP    Physician: Dr. Gearldine Bienenstock    Pt reports:   No urinary symptoms that are overly bothersome    Current urinary complaint:  None    EMG Evaluation of pelvic floor musculature  Electrodes Used     perianal surface electrodes    Patient Position  sitting    Initial Resting Tone  < 0.5 mV    Reflexive Cough  50 mV    Quick Contractions  Not Tested    Sustained Contractions  5s hold/ 10s rest    Net Contractions  10.53mV  Resting Average  1.36mV  Max Contraction  27.3mV      Resting/ Release on EMG:  WNL resting tone with good release to or below baseline.    Willadean Carol instructed in and performed home exercise program with a 5 second hold and a 10 second release for 15 repetitions in the seated position 3 times daily    EMG evaluation performed today and patient was educated about the role of pelvic floor muscles and postoperative bladder control. Pt was instructed in and performed pelvic muscle exercises. He was instructed in comprehensive and progressive post-operative rehabilitation program to begin 2-4 weeks post-operatively based on his  pain and recovery level. He was issued a written home program for preoperative and postoperative exercises.     Assessment:Boen S Zenk presents with Patient with excellent isolation and ability to perform pelvic muscle exercises for rehabilitation program.     Preoperative evaluation provided per research conducted by A.R. Bettey Costa. al. and J.L. Chang  et. al. as it concludes that PFME therapy instituted prior to radical prostatectomy aids in lower risk of postoperative urinary incontinence and earlier achievement of urinary continence. Preoperative PFME enable men to learn the sensations associated with normal motor control of their pelvic floor muscles.        Physical Therapy Short Term Goals to be achieved in 6 weeks  post-operatively :       1. Pt will be independent with HEP for improved  PFM control and decreased bladder leakage  2. Pt will demonstrate proper isolated pelvic floor contraction and/or relaxation for progression towards LTG's      Physical therapy Long Term goals to be achieved in 12  Weeks post-operatively:    1. Pt will demonstrate verbal understanding of good bladder toileting habits including relaxed voiding, double voiding, urge suppression strategies, bladder diary, timed voiding, and proper hygiene for long term control of condition.  2. Pt will be able to contract and maintain a PFM contraction during cough, sneeze, lift, etc. to control SUI.  3. Pt to report decreased urinary incontinence by 80-100% as defined by decreased use of pads and clothing changes.  4. Pt will be independent with HEP for improved  PFM control and decreased bladder leakage to have long term control of condition.    5. Pt will return to functional ADL's, not limited by incontinence.  6. Pt will return to all social activities, not limited by incontinence.  7. Pt will report ability to return to work, not limited by incontinence.      Plan:  Ongoing pt education and HEP advancement  Therapeutic exercise  Neuromotor re-education  Manual therapy  Progressive PME's  Home rental neuromuscular electric stimulation for pelvic floor  EMG assessment  Biofeedback training  Bladder scan  Pt to perform independent HEP.  and Follow up 6 weeks post operatively    UDI 6 and IIQ 7  04/26/2016   UDI 1 Freq urination 1   UDI 2 Leak - urgency 1   UDI 3 Leak - activity 0   UDI 4 Small leak 1   UDI 5 Difficulty empty 0   UDI 6 Pain abd or pelvis 1   UDI 6 Score 22   IIQ 1 Affected chores 0   IIQ 2 Affected recreation 0   IIQ 3 Affected entertainment 0   IIQ 4 Affected travel 0   IIQ 5 Affected social 0   IIQ 6 Affected emotional 0   IIQ 7 Frustration 0   IIQ 7 Score 0     [id number]  CJ  [id number]  CI    Walburga Hudman M. Diamante Truszkowski, PT, DPT       I have reviewed the treatment and agree with the plan of action.  I was present for the encounter.    Mohit Sirohi, MD

## 2016-05-11 ENCOUNTER — Institutional Professional Consult (permissible substitution): Admit: 2016-05-11 | Discharge: 2016-05-11 | Payer: MEDICARE | Primary: Internal Medicine

## 2016-05-11 DIAGNOSIS — C61 Malignant neoplasm of prostate: Secondary | ICD-10-CM

## 2016-05-11 NOTE — Progress Notes (Signed)
Patient here for voiding trial per Dr. Gearldine Bienenstock order.   Dr. Jimmye Norman is present in the clinic as incident to.     There were no vitals taken for this visit.     Procedure:    Patient was placed in supine position.  I have visualized the urine in the bag and recorded it as being clear  Under clean technique, 120 ccs of sterile water was instilled into patient's bladder via catheter/syringe at gravity.   Patient is advised that there will be a slight burning during removal of the catheter.  The balloon was emptied by inserting the barrel of the syringe and withdrawing the amount of fluid used during inflation. The catheter was gently grasped and removed without difficulty. The area was examined and no skin breakdown was noted.    Patient voided 140 ccs of clear urine.    Patient instructed to either return to our office or call our office back by 2 pm to let us know how He is doing/voiding.   I spent approximately 15 minutes with the patient today explaining the importance of notifying our office if He is unable to void. Patient also knows to report any symptoms of fever, chills, urinary frequency, burning, dribbling, hesitation in starting the stream of urine as well as cloudiness or any other unusual color or characteristic of the urine to our office.  Patient is made fully aware that if they are unable to void that they will need to return to our office prior to the close of business today or to the ER if they are unable to void after hours.  Patient verbalized understanding of the above.  Questions were answered.        Patient called back later and stated that he is voiding .       Orders Placed This Encounter   ??? PR IRRIGATION OF BLADDER       Corrinna Karapetyan     Reviewed by Renford Dills, MD

## 2016-05-18 ENCOUNTER — Ambulatory Visit
Admit: 2016-05-18 | Discharge: 2016-05-18 | Payer: MEDICARE | Attending: Physician Assistant | Primary: Internal Medicine

## 2016-05-18 DIAGNOSIS — C61 Malignant neoplasm of prostate: Secondary | ICD-10-CM

## 2016-05-18 LAB — AMB POC URINALYSIS DIP STICK AUTO W/O MICRO
Bilirubin (UA POC): NEGATIVE
Glucose (UA POC): NEGATIVE
Ketones (UA POC): NEGATIVE
Nitrites (UA POC): NEGATIVE
Protein (UA POC): NEGATIVE mg/dL
Specific gravity (UA POC): 1.005 (ref 1.001–1.035)
Urobilinogen (UA POC): 0.2 (ref 0.2–1)
pH (UA POC): 5.5 (ref 4.6–8.0)

## 2016-05-18 LAB — AMB POC PVR, MEAS,POST-VOID RES,US,NON-IMAGING: PVR: 11 cc

## 2016-05-18 MED ORDER — TRIMETHOPRIM-SULFAMETHOXAZOLE 160 MG-800 MG TAB
160-800 mg | ORAL_TABLET | Freq: Two times a day (BID) | ORAL | 0 refills | Status: DC
Start: 2016-05-18 — End: 2016-06-07

## 2016-05-18 MED ORDER — PHENAZOPYRIDINE 200 MG TAB
200 mg | ORAL_TABLET | Freq: Three times a day (TID) | ORAL | 0 refills | Status: AC | PRN
Start: 2016-05-18 — End: 2016-05-21

## 2016-05-18 NOTE — Progress Notes (Signed)
I have established the plan of care as outlined in the above notation. Further there are no changes to the PMH, Family history, or Social history at this time except as outlined.    Plan as outlined.    Darius Heinz B Pope Brunty, MD

## 2016-05-18 NOTE — Progress Notes (Signed)
PT is scheduled for CTU on Tuesday August 15 to arrive at [facility] at 1:00pm. No metformin on August 15,16,17. PT is aware.

## 2016-05-18 NOTE — Progress Notes (Signed)
[name]  December 16, 1945    05/18/2016      Encounter Diagnoses     ICD-10-CM ICD-9-CM   1. Malignant neoplasm of prostate ([place]) C61 185   2. Flank pain R10.9 789.09   3. Hematuria R31.9 599.70     ASSESSMENT:   1. 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 Prostate MRI 11/2015 showed two PIRADS 4 lesions at right and left posterior apex peripheral zone. Pathology gleason 4+3 with 7/19 total positive cores. Presenting PSA 9.04 ng/mL and TRUS volume 70 cc. No lymph nodes present on MRI Prostate. Bone scan 03/07/2016 revealed no evidence of osseous metastatic disease.    -s/p dvp 05/04/2016    2. BPH with minimal LUTS, prn Flomax 0.4 mg.    3. History of frequent UTIs. No recent infections. Currently Asymptomatic.     4. History of Urinary Retention requiring catheterization secondary to decongestant with URI in 2013.    5. Hx of A-fib: Currently on Xarelto.  Obtained clearance from cardiology. Will hold 6 days prior to surgery. Hold off on Metformin     6. DM: will hold Metformin 2 days prior to surgery.    PLAN:  RX for bActrim empiric, with pyridium   PVR 60ml.  Ordered CTU   Urine for culture will adjust treatment as indicated.   Go to the emergency room if you experience fever greater than 101.00, intractable vomiting or pain.   Okay to stop docusate sodium      DISCUSSION      Chief Complaint   Patient presents with   ??? Prostate Cancer     S/P DVP   ??? Hematuria   ??? Flank Pain       HISTORY OF PRESENT ILLNESS:   [name] is a 70 y.o. male who presents today s/p   DVP w/ RPLND on 05/04/2016 for Adenocarcinoma of the Prostate, s/p MRI fusion bx 01/20/2016, pathology gleason 4+3, 7/19 total positive cores, presenting PSA 9.04ng/mL. Bone scan 03/07/2016 revealed no evidence of osseous metastatic disease.     2 days ago he started with right low back pain that has migrated to his right flank and spread across his back to left flan.  He also has had  blood dripping out end of penis at the end of the void.   Associated with dysuria.    Patient denies fever, nausea, vomiting,  He does continue to have soft stools that are giving him hemorrhoids.     Erections: no issues , not currently sexual active as he is not in a relationship    Family history of prostate cancer in father.     PMH:  Afib on Xarelto. Diabetes. 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       Component Prostate Specific Ag   Latest Ref Rng  0.00 - 4.00 ng/mL   11/16/2015 9.04 (H)   05/20/2015 6.70 (H)   08/07/2014 6.14 (H)   06/12/2014 12.14 (H)   12/08/2013 5.68 (H)   04/04/2013 4.73 (A)   08/20/2012 5.99 (A)     Bone scan 03/07/2016:  IMPRESSION:  No bone scan evidence of osseous metastatic disease.     mp 3T MRI Prostate w/wo Cont 12/04/2015  IMPRESSION:  1. Approximate 9 mm lesion, left posterior apex peripheral zone- PIRADS Category 4  2. Approximate 5 mm lesion, right posterior apex peripheral zone- PIRADS Category 4  3. Benign prostatic hyperplasia  Past Medical History:   Diagnosis Date   ??? A-fib ([place])    ??? Benign prostatic hyperplasia with lower urinary tract symptoms    ??? BPH (benign prostatic hypertrophy) with urinary obstruction    ??? Diabetes mellitus ([place])    ??? Elevated prostate specific antigen (PSA)    ??? Enlarged prostate with lower urinary tract symptoms (LUTS)    ??? History of needle biopsy of prostate with negative result    ??? History of urinary retention    ??? Hypercholesteremia    ??? Hypertrophy of prostate with urinary obstruction and other lower urinary tract symptoms (LUTS)    ??? Prostate cancer ([place])      stage T1c Gleason 4+3 (7) adenocarcinoma of the prostate in 7/19 cores involving 30-50%, s/p MRI fusion prostate biopsy 01/20/16   ??? Urinary retention    ??? UTI (lower urinary tract infection)      Past Surgical History:   Procedure Laterality Date   ??? HX CYST REMOVAL     ??? HX UROLOGICAL  01/20/2016    PNBx-TRUS Vol 70cc's, Gleason,4+3 occupying core and 50% of the Bx   material, 3+4 present in cores and occupying 30% of the Bx specimen, 4+3 present in cores and occupying 40% of the Bx specimen, 3+4 presen tin core and occupying 35% of the Bx material, Dr Wynona Luna      Family History   Problem Relation Age of Onset   ??? Diabetes Mother    ??? Hypertension Mother    ??? Diabetes Sister    ??? Hypertension Sister    ??? Stroke Sister      Social History     Social History   ??? Marital status: DIVORCED     Spouse name: N/A   ??? Number of children: N/A   ??? Years of education: N/A     Occupational History   ??? Not on file.     Social History Main Topics   ??? Smoking status: Never Smoker   ??? Smokeless tobacco: Never Used   ??? Alcohol use No   ??? Drug use: No   ??? Sexual activity: Not on file     Other Topics Concern   ??? Not on file     Social History Narrative     No Known Allergies  Current Outpatient Prescriptions   Medication Sig Dispense Refill   ??? ACCU-CHEK AVIVA PLUS TEST STRP strip      ??? metoprolol (LOPRESSOR) 25 mg tablet      ??? pravastatin (PRAVACHOL) 10 mg tablet      ??? XARELTO 20 mg tab tablet      ??? tamsulosin (FLOMAX) 0.4 mg capsule Take 1 Cap by mouth daily (after dinner). 90 Cap 3   ??? metFORMIN (GLUCOPHAGE) 500 mg tablet Take  by mouth two (2) times daily (with meals).         Review of Systems  Constitutional: Fever: No  Skin: Rash: No  HEENT: Hearing difficulty: No  Eyes: Blurred vision: No  Cardiovascular: Chest pain: No  Respiratory: Shortness of breath: No  Gastrointestinal: Nausea/vomiting: No  Musculoskeletal: Back pain: Yes  Neurological: Weakness: No  Psychological: Memory loss: No  Comments/additional findings:     Physical Examination:  Visit Vitals   ??? BP 120/70   ??? Ht 5\' 8"  (1.727 m)   ??? Wt 205 lb (93 kg)   ??? BMI 31.17 kg/m2     Constitutional: WDWN, Pleasant and appropriate affect, No acute distress.  GU: no CVA tenderness  CV: no lower leg swelling  Resp: normal respiratory effort  ABD: non-distended, no tenderness  Musc:normal gait and station   Neuro: CN II-XII grossly intact.   Psych: normal affect, well groomed, appropriate answers, A&Ox3        Results for orders placed or performed in visit on 05/18/16   AMB POC URINALYSIS DIP STICK AUTO W/O MICRO   Result Value Ref Range    Color (UA POC)      Clarity (UA POC)      Glucose (UA POC) Negative Negative    Bilirubin (UA POC) Negative Negative    Ketones (UA POC) Negative Negative    Specific gravity (UA POC) 1.005 1.001 - 1.035    Blood (UA POC) 2+ Negative    pH (UA POC) 5.5 4.6 - 8.0    Protein (UA POC) Negative Negative mg/dL    Urobilinogen (UA POC) 0.2 mg/dL 0.2 - 1    Nitrites (UA POC) Negative Negative    Leukocyte esterase (UA POC) 1+ Negative            [id number] Glenice Laine, MD      Rowe Clack PA-C   [facility] [place] [address]  [phone] Main Number  [phone] Fax

## 2016-05-18 NOTE — Telephone Encounter (Signed)
Patient called stating that he started having blood in his urine that is getting progressively worse, urinating OK just frequently, also having kidney pain.  He is post op DVP with Dr. Gearldine Bienenstock, his urine has been clear since his catheter was removed, last week.  He is concerned about infection, denies fever, chills, nausea and vomiting, scheduled him to see Rowe Clack PA today, pt verbalized understanding.

## 2016-05-20 LAB — URINE C&S

## 2016-05-22 NOTE — Progress Notes (Signed)
Patient treated with Bactrim at office visit.   Appropriate ABX per sensitivity   Rowe Clack PA-C

## 2016-05-25 ENCOUNTER — Encounter

## 2016-05-29 NOTE — Progress Notes (Signed)
Will review at next appointment.   Rowe Clack PA-C

## 2016-06-07 ENCOUNTER — Institutional Professional Consult (permissible substitution)
Admit: 2016-06-07 | Discharge: 2016-06-07 | Payer: MEDICARE | Attending: Rehabilitative and Restorative Service Providers" | Primary: Internal Medicine

## 2016-06-07 ENCOUNTER — Ambulatory Visit: Admit: 2016-06-07 | Discharge: 2016-06-07 | Payer: MEDICARE | Attending: Urology | Primary: Internal Medicine

## 2016-06-07 DIAGNOSIS — R278 Other lack of coordination: Secondary | ICD-10-CM

## 2016-06-07 DIAGNOSIS — C61 Malignant neoplasm of prostate: Secondary | ICD-10-CM

## 2016-06-07 LAB — AMB POC URINALYSIS DIP STICK AUTO W/O MICRO
Bilirubin (UA POC): NEGATIVE
Glucose (UA POC): NEGATIVE
Ketones (UA POC): NEGATIVE
Leukocyte esterase (UA POC): NEGATIVE
Nitrites (UA POC): NEGATIVE
Specific gravity (UA POC): 1.03 (ref 1.001–1.035)
Urobilinogen (UA POC): 0.2 (ref 0.2–1)
pH (UA POC): 5 (ref 4.6–8.0)

## 2016-06-07 MED ORDER — IMIPRAMINE 50 MG TAB
50 mg | ORAL_TABLET | Freq: Every evening | ORAL | 11 refills | Status: DC
Start: 2016-06-07 — End: 2017-01-02

## 2016-06-07 NOTE — Progress Notes (Signed)
[name]  04/24/1946    06/07/2016      Encounter Diagnoses     ICD-10-CM ICD-9-CM   1. Prostate cancer [facility]) C61 185     ASSESSMENT:   1. 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 Prostate MRI 11/2015 showed two PIRADS 4 lesions at right and left posterior apex peripheral zone. Pathology gleason 4+3 with 7/19 total positive cores. Presenting PSA 9.04 ng/mL and TRUS volume 70 cc. No lymph nodes present on MRI Prostate. Bone scan 03/07/2016 revealed no evidence of osseous metastatic disease.    -s/p dvp 05/04/2016    2. BPH with minimal LUTS, prn Flomax 0.4 mg.    3. History of frequent UTIs. No recent infections. Currently Asymptomatic.     4. History of Urinary Retention requiring catheterization secondary to decongestant with URI in 2013.    5. Hx of A-fib: Currently on Xarelto.  Obtained clearance from cardiology. Will hold 6 days prior to surgery. Hold off on Metformin     6. DM: will hold Metformin 2 days prior to surgery.    PLAN:  -Reviewed CTU with patient, no cause of hematuria identified. Hematuria resolved.  -Prescribed imipramine  -F/u in 6 months with PSA    DISCUSSION      Chief Complaint   Patient presents with   ??? Prostate Cancer      stage T1c Gleason 4+3 (7) adenocarcinoma of the prostate in 7/19 cores involving 30-50%, s/p MRI fusion prostate biopsy 01/20/16   ??? Gross Hematuria       HISTORY OF PRESENT ILLNESS:   [name] is a 70 y.o. male who presents today s/p   DVP w/ RPLND on 05/04/2016 for Adenocarcinoma of the Prostate, s/p MRI fusion bx 01/20/2016, pathology gleason 4+3, 7/19 total positive cores, presenting PSA 9.04ng/mL. Bone scan 03/07/2016 revealed no evidence of osseous metastatic disease.     Patient had gross hematuria post-operatively and underwent CTU that was negative for disease recurrence or cause of gross hematuria. States hematuria resolved. Denies dysuria. Reports good FOS. Has some SUI. Denies  frequency or urgency. States he is emptying his bladder well.    Erections: no issues , not currently sexual active as he is not in a relationship    Family history of prostate cancer in father.     PMH:  Afib on Xarelto. Diabetes. 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       Component Prostate Specific Ag   Latest Ref Rng  0.00 - 4.00 ng/mL   11/16/2015 9.04 (H)   05/20/2015 6.70 (H)   08/07/2014 6.14 (H)   06/12/2014 12.14 (H)   12/08/2013 5.68 (H)   04/04/2013 4.73 (A)   08/20/2012 5.99 (A)     CTU 05/23/16:  HISTORY: ??70 year old man   SYMPTOMS: ??Flank pain, hematuria, history prostate cancer   REFERRING DIAGNOSIS: ??Urolithiasis     TECHNIQUE: Helical multislice. ??All MRI \\T\\amp; CT Diagnostics CT scans are performed using one of these three dose reduction techniques: automated exposure control, adjustment of the mA and/or kV according to patient size, or use of iterative reconstruction techniques.    COMPARISON: ??None     FINDINGS: ??  Lung bases: ??Within normal limits.   Liver: ??The liver is normal in size, morphology and contrast enhancement pattern.  Biliary tree: ??Within normal limits.   Gallbladder: ??Within normal limits.   Spleen: ??Within normal limits.   Pancreas: ??Within normal limits.  Adrenals: ??Within normal limits.     Kidneys/ureters: ??The kidneys appear normal in size, morphology, position, and contrast enhancement pattern. ??No calcified stones are identified in the kidneys, ureters or bladder. ??No solid masses are identified. ??No hydronephrosis or perirenal fluid collections. The ureters and bladder are unremarkable.    Bowel: ??Diverticulosis. ??No bowel obstruction or inflammation is seen. ??The appendix appears normal.   Aorta/vasculature: ??Within normal limits.   Pelvis: ??Prostatectomy with bilateral iliac dissection.   Miscellaneous: ??No pathologically enlarged lymph nodes or ascites is seen.     IMPRESSION:  1. Prostatectomy with bilateral iliac nodal dissection. ??No CT evidence  for abdominopelvic tumor recurrence.  2. No CT evidence for urolithiasis or renal/urinary tract malignancy.  3. Mild hepatic steatosis.  4. Diverticulosis. ??    Bone scan 03/07/2016:  IMPRESSION:  No bone scan evidence of osseous metastatic disease.     mp 3T MRI Prostate w/wo Cont 12/04/2015  IMPRESSION:  1. Approximate 9 mm lesion, left posterior apex peripheral zone- PIRADS Category 4  2. Approximate 5 mm lesion, right posterior apex peripheral zone- PIRADS Category 4  3. Benign prostatic hyperplasia        Past Medical History:   Diagnosis Date   ??? A-fib ([place])    ??? Benign prostatic hyperplasia with lower urinary tract symptoms    ??? BPH (benign prostatic hypertrophy) with urinary obstruction    ??? Diabetes mellitus ([place])    ??? Elevated prostate specific antigen (PSA)    ??? Enlarged prostate with lower urinary tract symptoms (LUTS)    ??? History of needle biopsy of prostate with negative result    ??? History of urinary retention    ??? Hypercholesteremia    ??? Hypertrophy of prostate with urinary obstruction and other lower urinary tract symptoms (LUTS)    ??? Prostate cancer ([place])      stage T1c Gleason 4+3 (7) adenocarcinoma of the prostate in 7/19 cores involving 30-50%, s/p MRI fusion prostate biopsy 01/20/16   ??? Urinary retention    ??? UTI (lower urinary tract infection)      Past Surgical History:   Procedure Laterality Date   ??? HX CYST REMOVAL     ??? HX PROSTATECTOMY  05/04/2016    DVP Dr Aubriauna Riner   ??? HX UROLOGICAL  01/20/2016    PNBx-TRUS Vol 70cc's, Gleason,4+3 occupying core and 50% of the Bx  material, 3+4 present in cores and occupying 30% of the Bx specimen, 4+3 present in cores and occupying 40% of the Bx specimen, 3+4 presen tin core and occupying 35% of the Bx material, Dr Wynona Luna      Family History   Problem Relation Age of Onset   ??? Diabetes Mother    ??? Hypertension Mother    ??? Diabetes Sister    ??? Hypertension Sister    ??? Stroke Sister      Social History     Social History   ??? Marital status: DIVORCED      Spouse name: N/A   ??? Number of children: N/A   ??? Years of education: N/A     Occupational History   ??? Not on file.     Social History Main Topics   ??? Smoking status: Never Smoker   ??? Smokeless tobacco: Never Used   ??? Alcohol use No   ??? Drug use: No   ??? Sexual activity: Not on file     Other Topics Concern   ??? Not on file  Social History Narrative     No Known Allergies  Current Outpatient Prescriptions   Medication Sig Dispense Refill   ??? ACCU-CHEK AVIVA PLUS TEST STRP strip      ??? metoprolol (LOPRESSOR) 25 mg tablet      ??? pravastatin (PRAVACHOL) 10 mg tablet      ??? XARELTO 20 mg tab tablet      ??? tamsulosin (FLOMAX) 0.4 mg capsule Take 1 Cap by mouth daily (after dinner). 90 Cap 3   ??? metFORMIN (GLUCOPHAGE) 500 mg tablet Take  by mouth two (2) times daily (with meals).         Review of Systems  Constitutional: Fever: No  Skin: Rash: No  HEENT: Hearing difficulty: No  Eyes: Blurred vision: No  Cardiovascular: Chest pain: No  Respiratory: Shortness of breath: No  Gastrointestinal: Nausea/vomiting: No  Musculoskeletal: Back pain: No  Neurological: Weakness: No  Psychological: Memory loss: No  Comments/additional findings:     Physical Examination:  Visit Vitals   ??? BP 118/72   ??? Ht 5\' 8"  (1.727 m)   ??? Wt 208 lb (94.3 kg)   ??? BMI 31.63 kg/m2     Constitutional: WDWN, Pleasant and appropriate affect, No acute distress.    GU: no CVA tenderness  CV: no lower leg swelling  Resp: normal respiratory effort  ABD: non-distended, no tenderness  Musc:normal gait and station  Neuro: CN II-XII grossly intact.   Psych: normal affect, well groomed, appropriate answers, A&Ox3        Results for orders placed or performed in visit on 05/18/16   URINE C&S   Result Value Ref Range    FINAL REPORT Microbiology results (A)    AMB POC URINALYSIS DIP STICK AUTO W/O MICRO   Result Value Ref Range    Color (UA POC)      Clarity (UA POC)      Glucose (UA POC) Negative Negative    Bilirubin (UA POC) Negative Negative     Ketones (UA POC) Negative Negative    Specific gravity (UA POC) 1.005 1.001 - 1.035    Blood (UA POC) 2+ Negative    pH (UA POC) 5.5 4.6 - 8.0    Protein (UA POC) Negative Negative mg/dL    Urobilinogen (UA POC) 0.2 mg/dL 0.2 - 1    Nitrites (UA POC) Negative Negative    Leukocyte esterase (UA POC) 1+ Negative   AMB POC PVR, MEAS,POST-VOID RES,US,NON-IMAGING   Result Value Ref Range    PVR 11 cc            [id number] Glenice Laine, MD    Rexene Alberts MD            I was present during visit.  I performed the pertinent parts of the exam and they are as documented above.  We  discussed the assessment and plan,  and I concur with plan.    Herbie [place] Grayson Pfefferle MD   Urologic Oncologist  [facility] Hermelinda [facility] MD Professorship  Professor of Urology  [employer]  Office [phone] Ext [phone]

## 2016-06-07 NOTE — Progress Notes (Signed)
Physical Therapy Prostatectomy Re-Evaluation    Patient name: [name]    Visit # 2    Date of surgery: 05/01/2016 DVP    Physician: Dr. Gearldine Bienenstock    Current complaint:  leaks with activity (stress induced)  leaks with urgency  leaks during sleep    Pt reports:  having constant leakage  Pain:  Having some perineal soreness, worse with increased activity, but improved overall since surgery  UI occurs with sit <> stand, urge sensation, coughing/sneezing/laughing, sleeping, exercise              Performing exercises lying down when he remembers, usually about once / day               Drinking mostly water, "a little" soda or tea    Pad type:  incontinence pads: 4- 6 per day , and   Adult diapers: 3-4    Pad wetness when changed:              wet to soaked    Daytime urinary frequency:  6-8x    Night time frequency:  2-4x    Urinary Stream:  good flow but doesn't last long      Treatment    EMG biofeedback/ Pelvic floor exercises    Electrodes Used     perianal surface electrodes    Patient Position  sitting    Initial Resting Tone  0.5 mV    Reflexive Cough  Not Tested    Quick Contractions  Not Tested    Sustained Contractions  5s hold/ 10s rest    Net Contractions  14.42mV  Resting Average  3.70mV  Max Contraction  29.51mV      Resting/ Release on EMG:  WNL resting tone but elevated with contractions-able to release to baseline.    [name] instructed in and performed home exercise program with a 5 second hold and a 10 second release for 15 repetitions in the seated position 3 times daily    Patient education    Pt given home program education [id number] PME's and bladder care lifestyle modifications, including dietary changes      Assessment   Pt presents with MUI and nocturnal enuresis s/p DVP.   Patient demonstrated on EMG: higher average and peak mV values.  Pt will benefit from skilled PT services to address the aforementioned symptoms. Good prognosis for goal achievement with regular therapy  attendance and diligent home program completion.     Physical Therapy Short Term Goals to be achieved in 6 weeks:       1. Pt will be independent with HEP for improved  PFM control and decreased bladder leakage  2. Pt will demonstrate proper isolated pelvic floor contraction and/or relaxation for progression towards LTG's      Physical therapy Long Term goals to be achieved in 12  Weeks:    1. Pt will demonstrate verbal understanding of good bladder toileting habits including relaxed voiding, double voiding, urge suppression strategies, bladder diary, timed voiding, and proper hygiene for long term control of condition.  2. Pt will be able to contract and maintain a PFM contraction during cough, sneeze, lift, etc. to control SUI / FI.  3. Pt to report decreased urinary incontinence by 80-100% as defined by decreased use of pads and clothing changes.  4. Pt will be independent with HEP for improved  PFM control and decreased bladder leakage to have long term control of condition.    5.  Pt will return to functional ADL's, not limited by incontinence.  6. Pt will return to all social activities, not limited by incontinence.  7. Pt will report ability to return to work, not limited by incontinence.     Plan  Patient will continue with HEP, with above treatment modifications  Consider next visit: increase PME's and quick flicks  Continued patient education regarding bladder habits, normal voiding patterns, dietary habits etc.    Follow up: every 3 weeks for 12 weeks    UDI 6 and IIQ 7  06/07/2016 04/26/2016   UDI 1 Freq urination 2 1   UDI 2 Leak - urgency 3 1   UDI 3 Leak - activity 3 0   UDI 4 Small leak N/A 1   UDI 5 Difficulty empty 0 0   UDI 6 Pain abd or pelvis 2 1   UDI 6 Score 67 22   IIQ 1 Affected chores 2 0   IIQ 2 Affected recreation 3 0   IIQ 3 Affected entertainment 2 0   IIQ 4 Affected travel 3 0   IIQ 5 Affected social 2 0   IIQ 6 Affected emotional 2 0   IIQ 7 Frustration 3 0   IIQ 7 Score 81 0      [id number]  CL  [id number]  CK    [name], PT, DPT      Reviewed by and agree with the plan.      Bruce A. Maida Sale, MD, FACS

## 2016-06-08 LAB — PROSTATE SPECIFIC ANTIGEN, TOTAL (PSA): Prostate Specific Ag: <0.03 ng/mL (ref 0–4)

## 2016-06-13 ENCOUNTER — Encounter: Attending: Rehabilitative and Restorative Service Providers" | Primary: Internal Medicine

## 2016-06-13 NOTE — Progress Notes (Signed)
Letter sent

## 2016-07-05 ENCOUNTER — Institutional Professional Consult (permissible substitution): Admit: 2016-07-05 | Discharge: 2016-07-05 | Payer: MEDICARE | Primary: Internal Medicine

## 2016-07-05 DIAGNOSIS — N393 Stress incontinence (female) (male): Secondary | ICD-10-CM

## 2016-07-05 NOTE — Progress Notes (Signed)
I have read the findings from today and agree with the assessment and plan.     Nivea Wojdyla , DPT

## 2016-07-05 NOTE — Progress Notes (Signed)
Physical Therapy Treatment Note     Patient: [name]    DOS: 07/05/2016 Visit #:3  Subjective    Pt reports:  improved urination infection clearing up   Pads 2-3  Frequency 2 hours  Nocturia 1-2      Objective          EMG biofeedback/ Pelvic floor exercises    Electrodes Used     perianal surface electrodes    Patient Position  sitting    Initial Resting Tone  under 1 mV    Reflexive Cough  Not Tested    Quick Contractions  2s hold/ 4s rest    Net Contractions  3.31mV  Resting Average  .51mV  Max Contraction  89mV    Sustained Contractions  5s hold/ 10s rest    Net Contractions  7.51mV  Resting Average  .87mV  Max Contraction  16mV      Resting/ Release on EMG:  WNL resting tone with good release to or below baseline.    [name] instructed in and performed home exercise program with a 5 second hold and a 10 second release for 15 repetitions in the seated position 2 times daily                  P  Assessment    Patient is demonstrating improved pelvic floor function on EMG improved rest tone and higher average and peak mV values  Patient reports improved bladder control, including using less pads, getting up less at night and decreased daytime frequency     Plan    Patient will continue with HEP, with above treatment modifications  Follow-up Disposition: Not on File

## 2016-07-26 ENCOUNTER — Institutional Professional Consult (permissible substitution)
Admit: 2016-07-26 | Discharge: 2016-07-26 | Payer: MEDICARE | Attending: Rehabilitative and Restorative Service Providers" | Primary: Internal Medicine

## 2016-07-26 DIAGNOSIS — R278 Other lack of coordination: Secondary | ICD-10-CM

## 2016-07-26 NOTE — Progress Notes (Signed)
Physical Therapy Treatment Note     Patient: [name]    DOS: 07/26/2016 Visit #: 4    Subjective    Pt reports:  compliance with home program   Overall improved UI, usually just uses 1 pad/day, sometimes 2  Still having some SUI (with a "good sneeze"), no UUI  Good urine flow      Objective     EMG biofeedback/ Pelvic floor exercises    Electrodes Used     perianal surface electrodes    Patient Position  stance    Initial Resting Tone  3-5 mV    Reflexive Cough  Not Tested    Quick Contractions  2s hold/ 4s rest    Net Contractions  25.65mV  Resting Average  14.64mV  Max Contraction  64.67mV    Sustained Contractions  5s hold/ 10s rest    Net Contractions  33.40mV  Resting Average  7.24mV  Max Contraction  77.89mV      Resting/ Release on EMG:  WNL resting tone with good release to or below baseline.    [name] instructed in and performed home exercise program with a 5 second hold and a 10 second release for 15 repetitions, and a 2 sec hold and 4 sec rest for 10 reps in the seated, standing, or reclined position 2 times daily each.  Pt also instructed in and performed bridges with PFM co-contraction, 5-10 reps, 1x/day.    Patient education    Pt given home program education [id number] PME's, Progressive core strengthening and use of PFM co-contraction with functional activities to assist with improving SUI       Assessment    Patient is demonstrating improved pelvic floor function on EMG with higher average and peak mV values, progressed to performing PMEs in standing.  Patient reports improved bladder control, including using less pads.  Patient is consistent with HEP and appears motivated for improvement.     Plan    Patient will continue with HEP, with above treatment modifications  Consider next visit: increase PME's and progress core stab  Continued patient education regarding bladder habits, normal voiding patterns, dietary habits etc.    Follow up in 6 weeks, UDI, d/c if continuing to do well    [employer]. Shawn Stall, PT, DPT    Follow-up Disposition: Not on File

## 2016-09-06 ENCOUNTER — Institutional Professional Consult (permissible substitution)
Admit: 2016-09-06 | Discharge: 2016-09-06 | Payer: MEDICARE | Attending: Rehabilitative and Restorative Service Providers" | Primary: Internal Medicine

## 2016-09-06 DIAGNOSIS — R278 Other lack of coordination: Secondary | ICD-10-CM

## 2016-09-06 NOTE — Progress Notes (Addendum)
Physical Therapy Treatment Note     Patient: [name]    DOS: 09/06/2016 Visit #: 5    Subjective    Pt reports:  Not performing exercises as frequently as prescribed, has been busy  "98% improvement" in leakage, only having a few drops if he waits too long to void  Noticeable improvement the last few weeks, has had several dry days  Pleased with progress, ready for d/c      Objective      EMG biofeedback/ Pelvic floor exercises    Not performed at this visit, pt declined     Patient education    Pt given extensive home program education > 8 [id number] PME's, Lifestyle modifications, including dietary changes for bladder issues and Progressive core strengthening, normal voiding patterns and healthy bladder habits, provided written and verbal instructions for progression to maintenance phase of exercises in the next couple of months       Assessment    Patient reports improved bladder control, including significant improvement in UI, only has a few drops of leakage sometimes if he waits too long to void, otherwise has had several dry days the last few weeks.  PT provided extensive review of HEP and maintaining healthy bladder habits.     Physical Therapy Short Term Goals to be achieved in 6 weeks post-operatively :     ??  1. Pt will be independent with HEP for improved  PFM control and decreased bladder leakage (MET)  2. Pt will demonstrate proper isolated pelvic floor contraction and/or relaxation for progression towards LTG's (MET)  ??  ??  Physical therapy Long Term goals to be achieved in 12  Weeks post-operatively:  ??  1. Pt will demonstrate verbal understanding of good bladder toileting habits including relaxed voiding, double voiding, urge suppression strategies, bladder diary, timed voiding, and proper hygiene for long term control of condition. (MET)  2. Pt will be able to contract and maintain a PFM contraction during cough, sneeze, lift, etc. to control SUI. (MET)   3. Pt to report decreased urinary incontinence by 80-100% as defined by decreased use of pads and clothing changes. (MET)  4. Pt will be independent with HEP for improved  PFM control and decreased bladder leakage to have long term control of condition.  (MET)  5. Pt will return to functional ADL's, not limited by incontinence. (MET)  6. Pt will return to all social activities, not limited by incontinence. (MET)  7. Pt will report ability to return to work, not limited by incontinence. (MET)  ??    Plan    Discharged with HEP     UDI 6 and IIQ 7  09/06/2016 06/07/2016 04/26/2016   UDI 1 Freq urination 0 2 1   UDI 2 Leak - urgency _0 UDI 3 Leak - activity 0 3 0   UDI 4 Small leak 1 N/A 1   UDI 5 Difficulty empty 0 0 0   UDI 6 Pain abd or pelvis 0 2 1   UDI 6 Score 11 67 22   IIQ 1 Affected chores 0 2 0   IIQ 2 Affected recreation 0 3 0   IIQ 3 Affected entertainment 0 2 0   IIQ 4 Affected travel 0 3 0   IIQ 5 Affected social 0 2 0   IIQ 6 Affected emotional 0 2 0   IIQ 7 Frustration 0 3 0   IIQ 7 Score 0 81 0  [id number]  CI  [id number]  CK  [id number]  [place] Shawn Stall, PT, DPT      Follow-up Disposition: Not on File      Chart reviewed. I agree with physical therapy assessment and plan as outlined above.  Vella Raring, MD

## 2016-09-12 ENCOUNTER — Ambulatory Visit: Admit: 2016-09-12 | Payer: MEDICARE | Attending: Urology | Primary: Internal Medicine

## 2016-09-12 DIAGNOSIS — C61 Malignant neoplasm of prostate: Secondary | ICD-10-CM

## 2016-09-12 LAB — AMB POC URINALYSIS DIP STICK AUTO W/O MICRO
Blood (UA POC): NEGATIVE
Glucose (UA POC): NEGATIVE
Leukocyte esterase (UA POC): NEGATIVE
Nitrites (UA POC): NEGATIVE
Protein (UA POC): NEGATIVE
Specific gravity (UA POC): 1.03 (ref 1.001–1.035)
Urobilinogen (UA POC): 0.2 (ref 0.2–1)
pH (UA POC): 5 (ref 4.6–8.0)

## 2016-09-12 LAB — PROSTATE SPECIFIC ANTIGEN, TOTAL (PSA): Prostate Specific Ag: <0.03 ng/mL (ref 0–4)

## 2016-09-12 NOTE — Progress Notes (Signed)
[name]  Apr 24, 1946    09/12/2016      Encounter Diagnoses     ICD-10-CM ICD-9-CM   1. Prostate cancer ([place]) C61 185   2. Stress incontinence, male N39.3 788.32     ASSESSMENT:   1. 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 Prostate MRI 11/2015 showed two PIRADS 4 lesions at right and left posterior apex peripheral zone. Pathology gleason 4+3 with 7/19 total positive cores. Presenting PSA 9.04 ng/mL and TRUS volume 70 cc. No lymph nodes present on MRI Prostate. Bone scan 03/07/2016 revealed no evidence of osseous metastatic disease.    -s/p dvp 05/04/2016      PLAN:  PSA drawn today <0.03  No further gross hematuria  Continue kegel exercises  F/u in 3 months with PSA prior    DISCUSSION      Chief Complaint   Patient presents with   ??? Prostate Cancer      stage T1c Gleason 4+3 (7) adenocarcinoma of the prostate in 7/19 cores involving 30-50%, s/p MRI fusion prostate biopsy 01/20/16       HISTORY OF PRESENT ILLNESS:   [name] is a 70 y.o. male who presents today s/p   DVP w/ RPLND on 05/04/2016 for Adenocarcinoma of the Prostate, s/p MRI fusion bx 01/20/2016, pathology gleason 4+3, 7/19 total positive cores, presenting PSA 9.04ng/mL. Bone scan 03/07/2016 revealed no evidence of osseous metastatic disease.   PSA drawn today <0.03    Patient had gross hematuria post-operatively and underwent CTU that was negative for disease recurrence or cause of gross hematuria. States hematuria resolved. Denies dysuria. Reports good FOS. SUI resolved essentially.  Denies frequency or urgency. States he is emptying his bladder well.    Erections:  not currently sexual active as he is not in a relationship    Family history of prostate cancer in father.     PMH:  Afib on Xarelto. Diabetes. 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         CTU 05/23/16:  HISTORY: ??70 year old man   SYMPTOMS: ??Flank pain, hematuria, history prostate cancer    REFERRING DIAGNOSIS: ??Urolithiasis     TECHNIQUE: Helical multislice. ??All MRI \\T\\amp; CT Diagnostics CT scans are performed using one of these three dose reduction techniques: automated exposure control, adjustment of the mA and/or kV according to patient size, or use of iterative reconstruction techniques.    COMPARISON: ??None     FINDINGS: ??  Lung bases: ??Within normal limits.   Liver: ??The liver is normal in size, morphology and contrast enhancement pattern.  Biliary tree: ??Within normal limits.   Gallbladder: ??Within normal limits.   Spleen: ??Within normal limits.   Pancreas: ??Within normal limits.   Adrenals: ??Within normal limits.     Kidneys/ureters: ??The kidneys appear normal in size, morphology, position, and contrast enhancement pattern. ??No calcified stones are identified in the kidneys, ureters or bladder. ??No solid masses are identified. ??No hydronephrosis or perirenal fluid collections. The ureters and bladder are unremarkable.    Bowel: ??Diverticulosis. ??No bowel obstruction or inflammation is seen. ??The appendix appears normal.   Aorta/vasculature: ??Within normal limits.   Pelvis: ??Prostatectomy with bilateral iliac dissection.   Miscellaneous: ??No pathologically enlarged lymph nodes or ascites is seen.     IMPRESSION:  1. Prostatectomy with bilateral iliac nodal dissection. ??No CT evidence for abdominopelvic tumor recurrence.  2. No CT evidence for urolithiasis or renal/urinary tract malignancy.  3. Mild  hepatic steatosis.  4. Diverticulosis. ??    Bone scan 03/07/2016:  IMPRESSION:  No bone scan evidence of osseous metastatic disease.     mp 3T MRI Prostate w/wo Cont 12/04/2015  IMPRESSION:  1. Approximate 9 mm lesion, left posterior apex peripheral zone- PIRADS Category 4  2. Approximate 5 mm lesion, right posterior apex peripheral zone- PIRADS Category 4  3. Benign prostatic hyperplasia        Past Medical History:   Diagnosis Date   ??? A-fib ([place])     ??? Benign prostatic hyperplasia with lower urinary tract symptoms    ??? BPH (benign prostatic hypertrophy) with urinary obstruction    ??? Diabetes mellitus ([place])    ??? Elevated prostate specific antigen (PSA)    ??? Enlarged prostate with lower urinary tract symptoms (LUTS)    ??? History of needle biopsy of prostate with negative result    ??? History of urinary retention    ??? Hypercholesteremia    ??? Hypertrophy of prostate with urinary obstruction and other lower urinary tract symptoms (LUTS)    ??? Prostate cancer ([place])      stage T1c Gleason 4+3 (7) adenocarcinoma of the prostate in 7/19 cores involving 30-50%, s/p MRI fusion prostate biopsy 01/20/16   ??? Urinary retention    ??? UTI (lower urinary tract infection)      Past Surgical History:   Procedure Laterality Date   ??? HX CYST REMOVAL     ??? HX PROSTATECTOMY  05/04/2016    DVP Dr Beau Vanduzer   ??? HX UROLOGICAL  01/20/2016    PNBx-TRUS Vol 70cc's, Gleason,4+3 occupying core and 50% of the Bx  material, 3+4 present in cores and occupying 30% of the Bx specimen, 4+3 present in cores and occupying 40% of the Bx specimen, 3+4 presen tin core and occupying 35% of the Bx material, Dr Wynona Luna      Family History   Problem Relation Age of Onset   ??? Diabetes Mother    ??? Hypertension Mother    ??? Diabetes Sister    ??? Hypertension Sister    ??? Stroke Sister      Social History     Social History   ??? Marital status: DIVORCED     Spouse name: N/A   ??? Number of children: N/A   ??? Years of education: N/A     Occupational History   ??? Not on file.     Social History Main Topics   ??? Smoking status: Never Smoker   ??? Smokeless tobacco: Never Used   ??? Alcohol use No   ??? Drug use: No   ??? Sexual activity: Not on file     Other Topics Concern   ??? Not on file     Social History Narrative     No Known Allergies  Current Outpatient Prescriptions   Medication Sig Dispense Refill   ??? metFORMIN (GLUCOPHAGE) 1,000 mg tablet      ??? imipramine (TOFRANIL) 50 mg tablet Take 1 Tab by mouth nightly. 30 Tab 11    ??? ACCU-CHEK AVIVA PLUS TEST STRP strip      ??? metoprolol (LOPRESSOR) 25 mg tablet      ??? pravastatin (PRAVACHOL) 10 mg tablet      ??? XARELTO 20 mg tab tablet          Review of Systems  Constitutional: Fever: No  Skin: Rash: No  HEENT: Hearing difficulty: No  Eyes: Blurred vision: No  Cardiovascular: Chest pain:  No  Respiratory: Shortness of breath: No  Gastrointestinal: Nausea/vomiting: No  Musculoskeletal: Back pain: No  Neurological: Weakness: No  Psychological: Memory loss: No  Comments/additional findings:     Physical Examination:  Visit Vitals   ??? BP 126/68   ??? Ht 5\' 8"  (1.727 m)   ??? Wt 208 lb (94.3 kg)   ??? BMI 31.63 kg/m2     Constitutional: WDWN, Pleasant and appropriate affect, No acute distress.    CV: no lower leg swelling  Resp: normal respiratory effort  Musc:normal gait and station  Skin: no jaundice        Results for orders placed or performed in visit on 09/12/16   PROSTATE SPECIFIC ANTIGEN, TOTAL (PSA)   Result Value Ref Range    Prostate Specific Ag <0.03  0 - 4 ng/mL   AMB POC URINALYSIS DIP STICK AUTO W/O MICRO   Result Value Ref Range    Color (UA POC)      Clarity (UA POC)      Glucose (UA POC) Negative Negative    Bilirubin (UA POC) 1+ Negative    Ketones (UA POC) 1+ Negative    Specific gravity (UA POC) 1.030 1.001 - 1.035    Blood (UA POC) Negative Negative    pH (UA POC) 5.0 4.6 - 8.0    Protein (UA POC) Negative Negative    Urobilinogen (UA POC) 0.2 mg/dL 0.2 - 1    Nitrites (UA POC) Negative Negative    Leukocyte esterase (UA POC) Negative Negative            [id number] Glenice Laine, MD  Thresa Ross, PA          I was present during visit.  I performed the pertinent parts of the exam and they are as documented above.  We  discussed the assessment and plan,  and I concur with plan.    Herbie Makena Jennifermarie Franzen MD   Urologic Oncologist  [facility] Hermelinda Medicus MD Professorship  Professor of Urology  [employer]  Office [phone] Ext [phone]

## 2016-09-12 NOTE — Progress Notes (Signed)
[name] presents today for lab draw per Dr. Bufford Buttner order.    Patient will be notified by the physician with lab results.    PSA obtained via venipuncture without any difficulty.      Orders Placed This Encounter   ??? PROSTATE SPECIFIC ANTIGEN, TOTAL (PSA)   ??? PR COLLECTION VENOUS BLOOD,VENIPUNCTURE       [name], LPN

## 2016-09-12 NOTE — Progress Notes (Signed)
sameday

## 2016-12-03 NOTE — Progress Notes (Signed)
Patient Name: [name] [id number])  DOB: [birth year] (71 year old)  Phone: [phone]  Address: [address]   Current Medications   imipramine 50 mg tablet - Dispense: Tablet; TABLET ORAL   metformin 1,000 mg tablet - Dispense: Tablet; TABLET ORAL   metoprolol succinate ER 25 mg tablet,extended release 24 hr - Dispense: Tablet; Tablet Sustained Release 24 hr ORAL   pravastatin 10 mg tablet - Dispense: Tablet; TABLET ORAL   Xarelto 20 mg tablet - Dispense: Tablet; TABLET ORAL  Care Coordination  During this month, <> was called at least 3 times to conduct a phone call, but we were unable to reach <> by phone. In lieu of this conversation, the following care coordination activities took place:   Medication Reconciliation and Management:   A coupon from [employer] was generated for [name] today and mailed to him. This was done to lower the overall cost of his medications in hopes of increasing compliance.   A medication history check was performed today. Medication history provides access to a patient's medication history based on information provided by the Pharmacy Benefit Manager (PBM) and/or Pharmacy Fill data. This information was used to reconcile and update the patient's medication list.   Based on information gathered, the patient's medication list has been updated to include any new medications that have been filled since the prior discussion. See medication list for details.   A total of 14 minutes was spent this month coordinating care related to the patient's medications.  Patient Education Provided: Based on Marks's medications, problem list, and unique socio-economic factors, educational documents were printed and mailed to the patient to better inform him of these items. Below is a list of documents that were provided:   Blood Sugar - Log   Blood Sugar - Checking It At Home   Benign Prostatic Hyperplasia   A total of 6 minutes was spent this month providing patient-specific education documents.     ~~This report was copied and pasted from the servicing EHR system.~~

## 2016-12-26 NOTE — Progress Notes (Signed)
Patient Name: [name] ([id number])  DOB: [birth year] (71 year old)  Phone: [phone]  Address: [address]   Current Medications   imipramine 50 mg tablet - Dispense: Tablet; TABLET ORAL   metformin 1,000 mg tablet - Dispense: Tablet; TABLET ORAL   metoprolol succinate ER 25 mg tablet,extended release 24 hr - Dispense: Tablet; Tablet Sustained Release 24 hr ORAL   pravastatin 10 mg tablet - Dispense: Tablet; TABLET ORAL   Xarelto 20 mg tablet - Dispense: Tablet; TABLET ORAL  Care Coordination  During this month, <> was called at least 3 times to conduct a phone call, but we were unable to reach <> by phone. In lieu of this conversation, the following care coordination activities took place:   Medication Reconciliation and Management:   A coupon from [employer] was generated for [name] today and mailed to him. This was done to lower the overall cost of his medications in hopes of increasing compliance.   A medication history check was performed today. Medication history provides access to a patient's medication history based on information provided by the Pharmacy Benefit Manager (PBM) and/or Pharmacy Fill data. This information was used to reconcile and update the patient's medication list.   Based on information gathered, the patient's medication list has been updated to include any new medications that have been filled since the prior discussion. See medication list for details.   A total of 14 minutes was spent this month coordinating care related to the patient's medications.  Patient Education Provided: Based on [name]'s medications, problem list, and unique socio-economic factors, educational documents were printed and mailed to the patient to better inform him of these items. Below is a list of documents that were provided:   Bladder Emptying   Frequent Urinary Tract Infections   Overactive Bladder - Tips   A total of 6 minutes was spent this month providing patient-specific education documents.       ~~This report was copied and pasted from the servicing EHR system.~~

## 2017-01-02 ENCOUNTER — Ambulatory Visit: Admit: 2017-01-02 | Discharge: 2017-01-02 | Payer: MEDICARE | Attending: Urology | Primary: Internal Medicine

## 2017-01-02 DIAGNOSIS — C61 Malignant neoplasm of prostate: Secondary | ICD-10-CM

## 2017-01-02 LAB — AMB POC URINALYSIS DIP STICK AUTO W/O MICRO
Blood (UA POC): NEGATIVE
Glucose (UA POC): NEGATIVE
Leukocyte esterase (UA POC): NEGATIVE
Nitrites (UA POC): NEGATIVE
Specific gravity (UA POC): 1.03 (ref 1.001–1.035)
Urobilinogen (UA POC): 0.2 (ref 0.2–1)
pH (UA POC): 5 (ref 4.6–8.0)

## 2017-01-02 LAB — PROSTATE SPECIFIC ANTIGEN, TOTAL (PSA): Prostate Specific Ag: <0.03 ng/mL (ref 0–4)

## 2017-01-02 NOTE — Progress Notes (Signed)
[name] presents today for lab draw per Dr. Given order.    Patient will be notified with lab results.    PSA obtained via venipuncture without any difficulty.      Orders Placed This Encounter   ??? PROSTATE SPECIFIC ANTIGEN, TOTAL (PSA)   ??? COLLECTION VENOUS BLOOD,VENIPUNCTURE       Sheralyn Boatman, LPN

## 2017-01-02 NOTE — Progress Notes (Signed)
[name]  04-13-1946    01/02/2017      Encounter Diagnoses     ICD-10-CM ICD-9-CM   1. Prostate cancer [name]) C61 185     ASSESSMENT:   1. 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 Prostate MRI 11/2015 showed two PIRADS 4 lesions at right and left posterior apex peripheral zone. Pathology gleason 4+3 with 7/19 total positive cores. Presenting PSA 9.04 ng/mL and TRUS volume 70 cc. No lymph nodes present on MRI Prostate. Bone scan 03/07/2016 revealed no evidence of osseous metastatic disease.    -s/p dvp 05/04/2016, [id number] gleason 3+4 prostate cancer   -Most recent PSA <0.03 on 01/02/17      PLAN:  PSA drawn today <0.03  Testosterone level ordered to evaluate for deficiency due to fatigue   No further gross hematuria  Continue kegel exercises  F/u in 3 months with PSA prior    DISCUSSION      Chief Complaint   Patient presents with   ??? Prostate Cancer      stage T1c Gleason 4+3 (7) adenocarcinoma of the prostate in 7/19 cores involving 30-50%, s/p MRI fusion prostate biopsy 01/20/16       HISTORY OF PRESENT ILLNESS:   [name] is a 71 y.o. male who presents today s/p   DVP w/ RPLND on 05/04/2016 for Adenocarcinoma of the Prostate, s/p MRI fusion bx 01/20/2016, pathology gleason 4+3, 7/19 total positive cores, presenting PSA 9.04ng/mL. Bone scan 03/07/2016 revealed no evidence of osseous metastatic disease.   PSA drawn today <0.03    Patient had gross hematuria post-operatively and underwent CTU that was negative for disease recurrence or cause of gross hematuria. States hematuria resolved. Denies dysuria. Reports good FOS. SUI resolved essentially.  Denies frequency or urgency. States he is emptying his bladder well.    Erections:  not currently sexual active as he is not in a relationship    Family history of prostate cancer in father.     PMH:  Afib on Xarelto. Diabetes. 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         CTU 05/23/16:   HISTORY: ??71 year old man   SYMPTOMS: ??Flank pain, hematuria, history prostate cancer   REFERRING DIAGNOSIS: ??Urolithiasis     TECHNIQUE: Helical multislice. ??All MRI \\T\\amp; CT Diagnostics CT scans are performed using one of these three dose reduction techniques: automated exposure control, adjustment of the mA and/or kV according to patient size, or use of iterative reconstruction techniques.    COMPARISON: ??None     FINDINGS: ??  Lung bases: ??Within normal limits.   Liver: ??The liver is normal in size, morphology and contrast enhancement pattern.  Biliary tree: ??Within normal limits.   Gallbladder: ??Within normal limits.   Spleen: ??Within normal limits.   Pancreas: ??Within normal limits.   Adrenals: ??Within normal limits.     Kidneys/ureters: ??The kidneys appear normal in size, morphology, position, and contrast enhancement pattern. ??No calcified stones are identified in the kidneys, ureters or bladder. ??No solid masses are identified. ??No hydronephrosis or perirenal fluid collections. The ureters and bladder are unremarkable.    Bowel: ??Diverticulosis. ??No bowel obstruction or inflammation is seen. ??The appendix appears normal.   Aorta/vasculature: ??Within normal limits.   Pelvis: ??Prostatectomy with bilateral iliac dissection.   Miscellaneous: ??No pathologically enlarged lymph nodes or ascites is seen.     IMPRESSION:  1. Prostatectomy with bilateral iliac nodal dissection. ??No CT evidence for  abdominopelvic tumor recurrence.  2. No CT evidence for urolithiasis or renal/urinary tract malignancy.  3. Mild hepatic steatosis.  4. Diverticulosis. ??    Bone scan 03/07/2016:  IMPRESSION:  No bone scan evidence of osseous metastatic disease.     mp 3T MRI Prostate w/wo Cont 12/04/2015  IMPRESSION:  1. Approximate 9 mm lesion, left posterior apex peripheral zone- PIRADS Category 4  2. Approximate 5 mm lesion, right posterior apex peripheral zone- PIRADS Category 4  3. Benign prostatic hyperplasia         Past Medical History:   Diagnosis Date   ??? A-fib ([place])    ??? Benign prostatic hyperplasia with lower urinary tract symptoms    ??? BPH (benign prostatic hypertrophy) with urinary obstruction    ??? Diabetes mellitus ([place])    ??? Elevated prostate specific antigen (PSA)    ??? Enlarged prostate with lower urinary tract symptoms (LUTS)    ??? History of needle biopsy of prostate with negative result    ??? History of urinary retention    ??? Hypercholesteremia    ??? Hypertrophy of prostate with urinary obstruction and other lower urinary tract symptoms (LUTS)    ??? Prostate cancer ([place])      stage T1c Gleason 4+3 (7) adenocarcinoma of the prostate in 7/19 cores involving 30-50%, s/p MRI fusion prostate biopsy 01/20/16   ??? Urinary retention    ??? UTI (lower urinary tract infection)      Past Surgical History:   Procedure Laterality Date   ??? HX CYST REMOVAL     ??? HX PROSTATECTOMY  05/04/2016    DVP Dr Diarra Kos   ??? HX UROLOGICAL  01/20/2016    PNBx-TRUS Vol 70cc's, Gleason,4+3 occupying core and 50% of the Bx  material, 3+4 present in cores and occupying 30% of the Bx specimen, 4+3 present in cores and occupying 40% of the Bx specimen, 3+4 presen tin core and occupying 35% of the Bx material, Dr Wynona Luna      Family History   Problem Relation Age of Onset   ??? Diabetes Mother    ??? Hypertension Mother    ??? Diabetes Sister    ??? Hypertension Sister    ??? Stroke Sister      Social History     Social History   ??? Marital status: DIVORCED     Spouse name: N/A   ??? Number of children: N/A   ??? Years of education: N/A     Occupational History   ??? Not on file.     Social History Main Topics   ??? Smoking status: Never Smoker   ??? Smokeless tobacco: Never Used   ??? Alcohol use No   ??? Drug use: No   ??? Sexual activity: Not on file     Other Topics Concern   ??? Not on file     Social History Narrative     No Known Allergies  Current Outpatient Prescriptions   Medication Sig Dispense Refill   ??? methylPREDNISolone (MEDROL DOSEPACK) 4 mg tablet       ??? LORazepam (ATIVAN) 2 mg tablet      ??? metFORMIN (GLUCOPHAGE) 1,000 mg tablet      ??? ACCU-CHEK AVIVA PLUS TEST STRP strip      ??? metoprolol (LOPRESSOR) 25 mg tablet      ??? pravastatin (PRAVACHOL) 10 mg tablet      ??? XARELTO 20 mg tab tablet          Review of  Systems  Constitutional: Fever: No  Skin: Rash: No  HEENT: Hearing difficulty: No  Eyes: Blurred vision: No  Cardiovascular: Chest pain: No  Respiratory: Shortness of breath: No  Gastrointestinal: Nausea/vomiting: No  Musculoskeletal: Back pain: Yes  Neurological: Weakness: No  Psychological: Memory loss: No  Comments/additional findings:     Physical Examination:  Visit Vitals   ??? BP 130/74   ??? Ht 5\' 8"  (1.727 m)   ??? Wt 208 lb (94.3 kg)   ??? BMI 31.63 kg/m2     Constitutional: WDWN, Pleasant and appropriate affect, No acute distress.    CV: no lower leg swelling  Resp: normal respiratory effort  Musc:normal gait and station  Skin: no jaundice        Results for orders placed or performed in visit on 01/02/17   PROSTATE SPECIFIC ANTIGEN, TOTAL (PSA)   Result Value Ref Range    Prostate Specific Ag <0.03  0 - 4 ng/mL   AMB POC URINALYSIS DIP STICK AUTO W/O MICRO   Result Value Ref Range    Color (UA POC)      Clarity (UA POC)      Glucose (UA POC) Negative Negative    Bilirubin (UA POC) 1+ Negative    Ketones (UA POC) 1+ Negative    Specific gravity (UA POC) 1.030 1.001 - 1.035    Blood (UA POC) Negative Negative    pH (UA POC) 5.0 4.6 - 8.0    Protein (UA POC) 1+ Negative    Urobilinogen (UA POC) 0.2 mg/dL 0.2 - 1    Nitrites (UA POC) Negative Negative    Leukocyte esterase (UA POC) Negative Negative      Thresa Ross, PA      I was present during visit.  I performed the pertinent parts of the exam and they are as documented above.  We  discussed the assessment and plan,  and I concur with plan.    Herbie McDonald Makylee Sanborn MD   Urologic Oncologist  [facility] Hermelinda [facility] MD Professorship  Professor of Urology  [facility]   Office [phone] Ext [phone]    DG:LOVFI K Ray, MD  Thresa Ross, [place]

## 2017-01-03 LAB — TESTOSTERONE: Testosterone: 169 ng/dL — ABNORMAL LOW (ref 348–1197)

## 2017-01-03 NOTE — Progress Notes (Signed)
T low but cannot treat at this time. Will monitor.

## 2017-01-04 NOTE — Progress Notes (Signed)
Letter sent

## 2017-01-30 NOTE — Progress Notes (Signed)
Patient Name:  [name] ([id number])  DOB: 21-Jun-1946 (71 year old)  Phone:  [phone]  Address:  [address]   Current Medications   ? imipramine 50 mg tablet - Dispense: Tablet; TABLET ORAL  ? metformin 1,000 mg tablet - Dispense: Tablet; TABLET ORAL  ? metoprolol succinate ER 25 mg tablet,extended release 24 hr - Dispense: Tablet; Tablet Sustained Release 24 hr ORAL  ? pravastatin 10 mg tablet - Dispense: Tablet; TABLET ORAL  ? Xarelto 20 mg tablet - Dispense: Tablet; TABLET ORAL  Care Coordination  During this month, <> was called at least 3 times to conduct a phone call, but we were unable to reach <> by phone. In lieu of this conversation, the following care coordination activities took place:   o Medication Reconciliation and Management:   ? A coupon from [employer] was generated for [name] today and mailed to him. This was done to lower the overall cost of his medications in hopes of increasing compliance.   ? A medication history check was performed today. Medication history provides access to a patient's medication history based on information provided by the Pharmacy Benefit Manager (PBM) and/or Pharmacy Fill data. This information was used to reconcile and update the patient's medication list.   ? Based on information gathered, the patient's medication list has been updated to include any new medications that have been filled since the prior discussion. See medication list for details.   ? A total of 14 minutes was spent this month coordinating care related to the patient's medications.  o Patient Education Provided: Based on [name]'s medications, problem list, and unique socio-economic factors, educational documents were printed and mailed to the patient to better inform him of these items. Below is a list of documents that were provided:   ? AHA - Heart Attack Warning Signs   ? AHA - High Blood Cholesterol and Triglycerides   ? AHA - What does my cholesterol mean?    ? A total of 6 minutes was spent this month providing patient-specific education documents.      ~~This report was copied and pasted from the servicing EHR system.~~

## 2017-02-05 ENCOUNTER — Inpatient Hospital Stay
Admit: 2017-02-05 | Discharge: 2017-02-05 | Disposition: A | Discharge status: Home / Self Care | Payer: MEDICARE | Attending: Emergency Medicine

## 2017-02-05 ENCOUNTER — Emergency Department: Admit: 2017-02-05 | Payer: MEDICARE | Primary: Internal Medicine

## 2017-02-05 DIAGNOSIS — I4891 Unspecified atrial fibrillation: Secondary | ICD-10-CM

## 2017-02-05 LAB — METABOLIC PANEL, BASIC
BUN: 14 mg/dl (ref 7–25)
CO2: 28 mEq/L (ref 21–32)
Calcium: 8.9 mg/dl (ref 8.5–10.1)
Chloride: 107 mEq/L (ref 98–107)
Creatinine: 0.7 mg/dl (ref 0.6–1.3)
GFR est AA: >60
GFR est non-AA: >60
Glucose: 164 mg/dl — ABNORMAL HIGH (ref 74–106)
Potassium: 4 mEq/L (ref 3.5–5.1)
Sodium: 140 mEq/L (ref 136–145)

## 2017-02-05 LAB — CBC WITH AUTOMATED DIFF
BASOPHILS: 0.6 % (ref 0–3)
EOSINOPHILS: 1.3 % (ref 0–5)
HCT: 35.7 % — ABNORMAL LOW (ref 37.0–50.0)
HGB: 12.2 gm/dl — ABNORMAL LOW (ref 12.4–17.2)
IMMATURE GRANULOCYTES: 0.4 % (ref 0.0–3.0)
LYMPHOCYTES: 33 % (ref 28–48)
MCH: 31 pg (ref 23.0–34.6)
MCHC: 34.2 gm/dl (ref 30.0–36.0)
MCV: 90.8 fL (ref 80.0–98.0)
MONOCYTES: 8.5 % (ref 1–13)
MPV: 10.4 fL — ABNORMAL HIGH (ref 6.0–10.0)
NEUTROPHILS: 56.2 % (ref 34–64)
NRBC: 0 (ref 0–0)
PLATELET: 192 10*3/uL (ref 140–450)
RBC: 3.93 M/uL (ref 3.80–5.70)
RDW-SD: 43.6 (ref 35.1–43.9)
WBC: 6.7 10*3/uL (ref 4.0–11.0)

## 2017-02-05 LAB — MAGNESIUM: Magnesium: 1.8 mg/dl (ref 1.6–2.6)

## 2017-02-05 LAB — PHOSPHORUS: Phosphorus: 2.5 mg/dl (ref 2.5–4.9)

## 2017-02-05 MED ORDER — SODIUM CHLORIDE 0.9% BOLUS IV
0.9 % | Freq: Once | INTRAVENOUS | Status: AC
Start: 2017-02-05 — End: 2017-02-05
  Administered 2017-02-05: 18:00:00 via INTRAVENOUS

## 2017-02-05 MED ORDER — ETOMIDATE 2 MG/ML IV SOLN
2 mg/mL | Freq: Once | INTRAVENOUS | Status: AC
Start: 2017-02-05 — End: 2017-02-05
  Administered 2017-02-05: 18:00:00 via INTRAVENOUS

## 2017-02-05 MED ORDER — ONDANSETRON (PF) 4 MG/2 ML INJECTION
4 mg/2 mL | Freq: Once | INTRAMUSCULAR | Status: AC
Start: 2017-02-05 — End: 2017-02-05
  Administered 2017-02-05: 18:00:00 via INTRAVENOUS

## 2017-02-05 MED ORDER — SODIUM CHLORIDE 0.9 % IJ SYRG
Freq: Once | INTRAMUSCULAR | Status: AC
Start: 2017-02-05 — End: 2017-02-05
  Administered 2017-02-05: 18:00:00 via INTRAVENOUS

## 2017-02-05 MED FILL — ONDANSETRON (PF) 4 MG/2 ML INJECTION: 4 mg/2 mL | INTRAMUSCULAR | Qty: 2

## 2017-02-05 MED FILL — ETOMIDATE 2 MG/ML IV SOLN: 2 mg/mL | INTRAVENOUS | Qty: 10

## 2017-02-05 NOTE — ED Notes (Signed)
Resident M[facility] and Dr. Lenoard Aden

## 2017-02-05 NOTE — ED Provider Notes (Addendum)
[facility]  Emergency Department Treatment Report        Patient: [name] Age: 71 y.o. Sex: male    Date of Birth: Apr 06, 1946 Admit Date: 02/05/2017 PCP: Lacey Jensen, MD   MRN: [id number]  CSN: [id number]     Room: ER01/ER01 Time Dictated: 11:41 AM      Chief Complaint   Chief Complaint   Patient presents with   ??? Irregular Heart Beat       History of Present Illness   70 y.o. male with a history of hypertension, hyperlipidemia, paroxysmal atrial fibrillation and type 2 diabetes presenting to the emergency department with palpitations and atrial fibrillation with rapid ventricular response. He is sent here from his primary care physician's office. He takes metoprolol and Xarelto for his A. fib and has been reportedly compliant with all his medications. He is not having any chest pain. He does endorse some palpitations. There is no shortness of breath. No history of heart failure. He had a normal echocardiogram in 2015. He is followed by [facility] cardiologists. He denies any history of hyperthyroidism. Denies any alcohol or drug use. No fever, no nausea no vomiting    Review of Systems   Review of Systems   Constitutional: Negative for chills and fever.   HENT: Negative for congestion.    Eyes: Negative for blurred vision.   Respiratory: Negative for cough, sputum production and shortness of breath.    Cardiovascular: Positive for palpitations. Negative for chest pain, orthopnea, claudication and leg swelling.   Gastrointestinal: Negative for abdominal pain, diarrhea, nausea and vomiting.   Genitourinary: Negative for dysuria, frequency and urgency.   Musculoskeletal: Negative for back pain and neck pain.   Skin: Negative for rash.   Neurological: Negative for headaches.       Past Medical/Surgical History     Past Medical History:   Diagnosis Date   ??? A-fib ([place])    ??? Benign prostatic hyperplasia with lower urinary tract symptoms    ??? BPH (benign prostatic hypertrophy) with urinary obstruction     ??? Diabetes mellitus ([place])    ??? Elevated prostate specific antigen (PSA)    ??? Enlarged prostate with lower urinary tract symptoms (LUTS)    ??? History of needle biopsy of prostate with negative result    ??? History of urinary retention    ??? Hypercholesteremia    ??? Hypertrophy of prostate with urinary obstruction and other lower urinary tract symptoms (LUTS)    ??? 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 Urinary retention    ??? UTI (lower urinary tract infection)      Past Surgical History:   Procedure Laterality Date   ??? HX CYST REMOVAL     ??? HX PROSTATECTOMY  05/04/2016    DVP Dr Given   ??? HX UROLOGICAL  01/20/2016    PNBx-TRUS Vol 70cc's, Gleason,4+3 occupying core and 50% of the Bx  material, 3+4 present in cores and occupying 30% of the Bx specimen, 4+3 present in cores and occupying 40% of the Bx specimen, 3+4 presen tin core and occupying 35% of the Bx material, Dr Wynona Luna        Social History     Social History     Social History   ??? Marital status: DIVORCED     Spouse name: N/A   ???  Number of children: N/A   ??? Years of education: N/A     Occupational History   ??? Not on file.     Social History Main Topics   ??? Smoking status: Never Smoker   ??? Smokeless tobacco: Never Used   ??? Alcohol use No   ??? Drug use: No   ??? Sexual activity: Not on file     Other Topics Concern   ??? Not on file     Social History Narrative       Family History     Family History   Problem Relation Age of Onset   ??? Diabetes Mother    ??? Hypertension Mother    ??? Diabetes Sister    ??? Hypertension Sister    ??? Stroke Sister        Current Medications     Prior to Admission Medications   Prescriptions Last Dose Informant Patient Reported? Taking?   ACCU-CHEK AVIVA PLUS TEST STRP strip   Yes No   LORazepam (ATIVAN) 2 mg tablet   Yes No   XARELTO 20 mg tab tablet   Yes No   metFORMIN (GLUCOPHAGE) 1,000 mg tablet   Yes No    methylPREDNISolone (MEDROL DOSEPACK) 4 mg tablet   Yes No   metoprolol (LOPRESSOR) 25 mg tablet   Yes No   pravastatin (PRAVACHOL) 10 mg tablet   Yes No      Facility-Administered Medications: None       Allergies   No Known Allergies    Physical Exam   ED Triage Vitals   ED Encounter Vitals Group      BP 02/05/17 1102 128/96      Pulse (Heart Rate) 02/05/17 1102 133      Resp Rate 02/05/17 1102 16      Temp 02/05/17 1102 98.1 ??F (36.7 ??C)      Temp src --       O2 Sat (%) 02/05/17 1102 96 %      Weight 02/05/17 1059 200 lb      Height 02/05/17 1059 5\' 8"      Physical Exam   Constitutional: He is oriented to person, place, and time and well-developed, well-nourished, and in no distress. No distress.   HENT:   Head: Normocephalic and atraumatic.   Eyes: No scleral icterus.   Neck: Neck supple. No JVD present. No thyromegaly present.   Cardiovascular: Intact distal pulses.    Irregularly irregular, tachycardic   Pulmonary/Chest: Effort normal and breath sounds normal. No stridor. No respiratory distress. He has no wheezes. He has no rales.   Abdominal: Soft. He exhibits no distension. There is no tenderness.   Musculoskeletal: He exhibits no edema or tenderness.   Neurological: He is alert and oriented to person, place, and time. GCS score is 15.   Skin: Skin is warm and dry. He is not diaphoretic. No pallor.   Nursing note and vitals reviewed.       Impression and Management Plan/MDM/ED Course   71 year old male with paroxysmal atrial fibrillation. Here with atrial fibrillation with rapid ventricular response. He is hemodynamically stable. He is compliant with his relative. His last oral intake was at 7:30 this morning.  No history of hyperthyroidism, he denies any drug or alcohol use.  No signs or symptoms of heart failure. He has no JVD is no paroxysmal nocturnal dyspnea. There is no lower extremity edema. He does not appear to have any findings of high output heart failure on  exam.   Getting a chest x-ray to evaluate for any effusions or pulmonary edema, or underlying pneumonia  Checking electrolytes magnesium and phosphorus's EKG does not show any findings of acute ischemia. He is not having chest pain. Has not had a history of heart attack. I don't think a troponin is indicated in this gentleman is in atrial fibrillation RVR with the strong possibility of false positives  He seems to be a good candidate for procedural sedation followed by electrocardioversion    1:19 PM  Chest x-ray is without any evidence of pulmonary edema or any other signs of heart failure. I discussed patient with Dr. Marrion Coy after reviewing his labs which are unrelated markable aside from mildly low magnesium at 1.8. He is in agreement that the patient is a good candidate for electrical synchronized cardioversion. We will pursue this plan he will have follow-up with [facility].    3:41 PM  Patient tolerated the sedation and cardioversion without difficulty. He is now in sinus rhythm. He is awake alert and tolerating fluids by mouth. He has a ride home. His daughter is at the bedside and will be taking care of him and driving him home.    Procedural Sedation  Date/Time: 02/05/2017 2:54 PM  Performed by: Janalee Dane D  Authorized by: Martin Majestic     Consent:     Consent obtained:  Written    Consent given by:  Patient    Risks discussed:  Allergic reaction, prolonged hypoxia resulting in organ damage, dysrhythmia, prolonged sedation necessitating reversal, inadequate sedation, respiratory compromise necessitating ventilatory assistance and intubation, nausea and vomiting    Alternatives discussed:  Analgesia without sedation  Indications:     Procedure performed:  Cardioversion    Procedure necessitating sedation performed by:  Different physician (Dr. Lenoard Aden)    Intended level of sedation:  Deep  Pre-sedation assessment:     Time since last food or drink:  Greater than 4 hours (0730 this AM)_     ASA classification: class 2 - patient with mild systemic disease      Neck mobility: normal      Mouth opening:  3 or more finger widths    Mallampati score:  II - soft palate, uvula, fauces visible    Pre-sedation assessments completed and reviewed: airway patency, cardiovascular function, hydration status, mental status, nausea/vomiting, pain level, respiratory function and temperature      History of difficult intubation: no      Pre-sedation assessment completed:  02/05/2017 1:51 PM  Procedure details (see MAR for exact dosages):     Sedation start time:  02/05/2017 2:18 PM    Preoxygenation:  Nasal cannula    Sedation:  Etomidate    Analgesia:  None    Intra-procedure monitoring:  Blood pressure monitoring, continuous capnometry, frequent LOC assessments, cardiac monitor, continuous pulse oximetry and frequent vital sign checks    Intra-procedure events: none      Intra-procedure management:  Supplemental oxygen and airway repositioning    Sedation end time:  02/05/2017 2:28 PM    Total sedation time (minutes):  10  Post-procedure details:     Post-sedation assessment completed:  02/05/2017 2:53 PM    Attendance: Constant attendance by certified staff until patient recovered      Recovery: Patient returned to pre-procedure baseline      Complications:  None    Post-sedation assessments completed and reviewed: airway patency, cardiovascular function, hydration status, mental status, nausea/vomiting, pain level, respiratory function and temperature  Specimens recovered:  None    Patient is stable for discharge or admission: yes      Patient tolerance:  Tolerated well, no immediate complications  Cardioversion, electrical  Date/Time: 02/05/2017 3:30 PM  Performed by: Janalee Dane D  Authorized by: Janalee Dane D     Consent:     Consent obtained:  Written    Consent given by:  Patient    Risks discussed:  Cutaneous burn, death, induced arrhythmia and pain     Alternatives discussed:  No treatment, rate-control medication, alternative treatment, referral, delayed treatment and observation  Pre-procedure details:     Cardioversion basis:  Elective    Rhythm:  Atrial fibrillation    Electrode placement:  Anterior-posterior  Attempt one:     Cardioversion mode:  Synchronous    Waveform:  Biphasic    Shock (Joules):  200    Shock outcome:  Conversion to normal sinus rhythm  Post-procedure details:     Patient status:  Awake    Patient tolerance of procedure:  Tolerated well, no immediate complications            Diagnostic Studies   Lab:   Recent Results (from the past 12 hour(s))   CBC WITH AUTOMATED DIFF    Collection Time: 02/05/17 11:55 AM   Result Value Ref Range    WBC 6.7 4.0 - 11.0 1000/mm3    RBC 3.93 3.80 - 5.70 M/uL    HGB 12.2 (L) 12.4 - 17.2 gm/dl    HCT 35.7 (L) 37.0 - 50.0 %    MCV 90.8 80.0 - 98.0 fL    MCH 31.0 23.0 - 34.6 pg    MCHC 34.2 30.0 - 36.0 gm/dl    PLATELET 192 140 - 450 1000/mm3    MPV 10.4 (H) 6.0 - 10.0 fL    RDW-SD 43.6 35.1 - 43.9      NRBC 0 0 - 0      IMMATURE GRANULOCYTES 0.4 0.0 - 3.0 %    NEUTROPHILS 56.2 34 - 64 %    LYMPHOCYTES 33.0 28 - 48 %    MONOCYTES 8.5 1 - 13 %    EOSINOPHILS 1.3 0 - 5 %    BASOPHILS 0.6 0 - 3 %   METABOLIC PANEL, BASIC    Collection Time: 02/05/17 11:55 AM   Result Value Ref Range    Sodium 140 136 - 145 mEq/L    Potassium 4.0 3.5 - 5.1 mEq/L    Chloride 107 98 - 107 mEq/L    CO2 28 21 - 32 mEq/L    Glucose 164 (H) 74 - 106 mg/dl    BUN 14 7 - 25 mg/dl    Creatinine 0.7 0.6 - 1.3 mg/dl    GFR est AA >60      GFR est non-AA >60      Calcium 8.9 8.5 - 10.1 mg/dl   MAGNESIUM    Collection Time: 02/05/17 11:55 AM   Result Value Ref Range    Magnesium 1.8 1.6 - 2.6 mg/dl   PHOSPHORUS    Collection Time: 02/05/17 11:55 AM   Result Value Ref Range    Phosphorus 2.5 2.5 - 4.9 mg/dl     Labs Reviewed   CBC WITH AUTOMATED DIFF - Abnormal; Notable for the following:        Result Value    HGB 12.2 (*)     HCT 35.7 (*)      MPV 10.4 (*)  All other components within normal limits   METABOLIC PANEL, BASIC - Abnormal; Notable for the following:     Glucose 164 (*)     All other components within normal limits   MAGNESIUM   PHOSPHORUS       Imaging:    Xr Chest Pa Lat    Result Date: 02/05/2017  Indication: Palpitations PA and lateral chest Compared to 07/14/2014 there is increased depth of inspiration. The heart is within normal limits in size with a somewhat tortuous aorta. Interstitial thickening seen in the lungs without consolidation.     IMPRESSION: No acute abnormality. Increased depth of inspiration.        EKG irregularly irregular, no evidence of P waves, tachycardic, no findings of acute ischemia, normal QRS interval, normal QT. Atrial fibrillation with rapid ventricular response    Final Diagnosis       ICD-10-CM ICD-9-CM   1. Atrial fibrillation with RVR (HCC) I48.91 427.31   2. Anticoagulated Z79.01 V58.61       Disposition   Discharged home with cardiology follow-up      The patient was personally evaluated by myself and Dr. Lenoard Aden who agrees with the above assessment and plan.    Janalee Dane, MD  [facility]  February 05, 2017     Chart completion by Dr. Lenoard Aden:  I, Dr. Martin Majestic, MD, have personally seen and examined this patient; I have fully participated in the care of this patient with the resident.  I have reviewed and agree with all pertinent clinical information including history, physical exam, labs, radiographic studies and the plan.  I have also reviewed and agree with the medications, allergies and past medical history sections for this patient.    Briefly, this is a 71 y.o. male who presents to the ED with rapid heart rate.  He does endorse palpitations and some generalized weakness.  On physical exam, patient is lying in bed in no acute distress.  Heart is irregularly irregular.  Lungs clear to auscultation bilaterally.  Abdomen is soft and nontender.  His labs are unremarkable.  After discussion of  the risks and benefits, decision was made to perform electrical cardioversion since he is anticoagulated.  Please see the procedure notes above.  I was present for and assisted with both the cardioversion and sedation.  We used etomidate for sedation.  Patient will from sedation and maintains normal sinus rhythm.  He was felt to be stable for discharge with outpatient follow-up.    Please note that portions of this note were completed with a voice recognition program.  Efforts were made to edit the dictations but occasionally words are mis-transcribed.    Martin Majestic, MD  Feb 06, 2017        My signature above authenticates this document and my orders, the final ??  diagnosis (es), discharge prescription (s), and instructions in the Epic ??  record.  If you have any questions please contact [phone].  ??  Nursing notes have been reviewed by the physician/ advanced practice ??  Clinician.    Dragon medical dictation software was used for portions of this report. Unintended voice recognition errors may occur.

## 2017-02-05 NOTE — ED Notes (Signed)
Patient alert and awake, daughter at bedside, provided crackers and juice

## 2017-02-05 NOTE — ED Notes (Signed)
Chart reviewed by Lia Hopping, RN    02/09/17  6:03 PM

## 2017-02-05 NOTE — Other (Signed)
4:27 PM  02/05/17     Discharge instructions given to patient (name) with verbalization of understanding. Patient accompanied by family.  Patient discharged with the following prescriptions none. Patient discharged to home (destination).      Anastasia Pall, RN

## 2017-02-05 NOTE — ED Triage Notes (Signed)
Pt seen at [facility] this morning for rapid heart rate. [facility] ECG showed A Fib with RVR (HR 136). Pt advised to come to ED for treatment. Pt hx of A. Fib.

## 2017-04-06 ENCOUNTER — Ambulatory Visit: Admit: 2017-04-06 | Discharge: 2017-04-06 | Payer: MEDICARE | Attending: Urology | Primary: Internal Medicine

## 2017-04-06 DIAGNOSIS — C61 Malignant neoplasm of prostate: Secondary | ICD-10-CM

## 2017-04-06 LAB — AMB POC URINALYSIS DIP STICK AUTO W/O MICRO
Bilirubin (UA POC): NEGATIVE
Blood (UA POC): NEGATIVE
Glucose (UA POC): NEGATIVE
Ketones (UA POC): NEGATIVE
Leukocyte esterase (UA POC): NEGATIVE
Nitrites (UA POC): NEGATIVE
Protein (UA POC): NEGATIVE
Specific gravity (UA POC): 1.02 (ref 1.001–1.035)
Urobilinogen (UA POC): 0.2 (ref 0.2–1)
pH (UA POC): 6 (ref 4.6–8.0)

## 2017-04-06 LAB — PROSTATE SPECIFIC ANTIGEN, TOTAL (PSA): Prostate Specific Ag: <0.03 ng/mL (ref 0–4)

## 2017-04-06 NOTE — Progress Notes (Signed)
[name]  12/22/1945    04/06/2017    ASSESSMENT:  Encounter Diagnoses     ICD-10-CM ICD-9-CM   1. Prostate cancer ([place]) C61 185   2. Low testosterone R79.89 790.99     1. 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 Prostate MRI 11/2015 showed two PIRADS 4 lesions at right and left posterior apex peripheral zone. Pathology gleason 4+3 with 7/19 total positive cores. Presenting PSA 9.04 ng/mL and TRUS volume 70 cc. No lymph nodes present on MRI Prostate. Bone scan 03/07/2016 revealed no evidence of osseous metastatic disease.    -s/p dvp 05/04/2016, [id number] gleason 3+4 prostate cancer   -Most recent PSA <0.03 on 01/02/17. PSA today pending    Low T- 169 as of 01/02/2017      PLAN:  PSA drawn today, will fwd results to pt   Discussed possibly starting TRT in the future. Will hold off for another year.  F/u in 6 months with PSA and T prior          Chief Complaint   Patient presents with   ??? Prostate Cancer     cT1cN0Mx Adenocarcinoma of the Prostate, dx 01/20/2016 s/p MRI fusion prostate biopsy 01/20/2016       HISTORY OF PRESENT ILLNESS:   [name] is a 71 y.o. male who presents today s/p   DVP w/ RPLND on 05/04/2016 for Adenocarcinoma of the Prostate, s/p MRI fusion bx 01/20/2016, pathology gleason 4+3, 7/19 total positive cores, presenting PSA 9.04ng/mL. Bone scan 03/07/2016 revealed no evidence of osseous metastatic disease.   PSA drawn (01/02/2017) <0.03. PSA today pending.    Patient reports no voiding complaints. He notes SUI has resolved. No pad usage. Denies hematuria, dysuria, incontinence, urgency, frequency, and incomplete bladder emptying. Currently asymptomatic for infection. Denies bone or back pain. Good appetite and stable weight    He reports his energy level is low, low libido, and chronic fatigue. Most recent Testosterone was 169 on 01/02/2017    Erections:  not currently sexual active as he is not in a relationship     Family history of prostate cancer in father.     PMH:  Afib on Xarelto. Diabetes. 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       CTU 05/23/16:  HISTORY: ??71 year old man   SYMPTOMS: ??Flank pain, hematuria, history prostate cancer   REFERRING DIAGNOSIS: ??Urolithiasis     TECHNIQUE: Helical multislice. ??All MRI \\T\\amp; CT Diagnostics CT scans are performed using one of these three dose reduction techniques: automated exposure control, adjustment of the mA and/or kV according to patient size, or use of iterative reconstruction techniques.    COMPARISON: ??None     FINDINGS: ??  Lung bases: ??Within normal limits.   Liver: ??The liver is normal in size, morphology and contrast enhancement pattern.  Biliary tree: ??Within normal limits.   Gallbladder: ??Within normal limits.   Spleen: ??Within normal limits.   Pancreas: ??Within normal limits.   Adrenals: ??Within normal limits.     Kidneys/ureters: ??The kidneys appear normal in size, morphology, position, and contrast enhancement pattern. ??No calcified stones are identified in the kidneys, ureters or bladder. ??No solid masses are identified. ??No hydronephrosis or perirenal fluid collections. The ureters and bladder are unremarkable.    Bowel: ??Diverticulosis. ??No bowel obstruction or inflammation is seen. ??The appendix appears normal.   Aorta/vasculature: ??Within normal limits.   Pelvis: ??Prostatectomy with bilateral iliac dissection.   Miscellaneous: ??  No pathologically enlarged lymph nodes or ascites is seen.     IMPRESSION:  1. Prostatectomy with bilateral iliac nodal dissection. ??No CT evidence for abdominopelvic tumor recurrence.  2. No CT evidence for urolithiasis or renal/urinary tract malignancy.  3. Mild hepatic steatosis.  4. Diverticulosis. ??    Bone scan 03/07/2016:  IMPRESSION:  No bone scan evidence of osseous metastatic disease.     mp 3T MRI Prostate w/wo Cont 12/04/2015  IMPRESSION:   1. Approximate 9 mm lesion, left posterior apex peripheral zone- PIRADS Category 4  2. Approximate 5 mm lesion, right posterior apex peripheral zone- PIRADS Category 4  3. Benign prostatic hyperplasia        Past Medical History:   Diagnosis Date   ??? A-fib ([place])    ??? Benign prostatic hyperplasia with lower urinary tract symptoms    ??? BPH (benign prostatic hypertrophy) with urinary obstruction    ??? Diabetes mellitus ([place])    ??? Elevated prostate specific antigen (PSA)    ??? Enlarged prostate with lower urinary tract symptoms (LUTS)    ??? History of needle biopsy of prostate with negative result    ??? History of urinary retention    ??? Hypercholesteremia    ??? Hypertrophy of prostate with urinary obstruction and other lower urinary tract symptoms (LUTS)    ??? Prostate cancer ([place])      stage T1c Gleason 4+3 (7) adenocarcinoma of the prostate in 7/19 cores involving 30-50%, s/p MRI fusion prostate biopsy 01/20/16   ??? Urinary retention    ??? UTI (lower urinary tract infection)      Past Surgical History:   Procedure Laterality Date   ??? HX CYST REMOVAL     ??? HX PROSTATECTOMY  05/04/2016    DVP Dr Haniyah Maciolek   ??? HX UROLOGICAL  01/20/2016    PNBx-TRUS Vol 70cc's, Gleason,4+3 occupying core and 50% of the Bx  material, 3+4 present in cores and occupying 30% of the Bx specimen, 4+3 present in cores and occupying 40% of the Bx specimen, 3+4 presen tin core and occupying 35% of the Bx material, Dr Wynona Luna      Family History   Problem Relation Age of Onset   ??? Diabetes Mother    ??? Hypertension Mother    ??? Diabetes Sister    ??? Hypertension Sister    ??? Stroke Sister      Social History     Social History   ??? Marital status: DIVORCED     Spouse name: N/A   ??? Number of children: N/A   ??? Years of education: N/A     Occupational History   ??? Not on file.     Social History Main Topics   ??? Smoking status: Never Smoker   ??? Smokeless tobacco: Never Used   ??? Alcohol use No   ??? Drug use: No   ??? Sexual activity: Not on file     Other Topics Concern    ??? Not on file     Social History Narrative     No Known Allergies  Current Outpatient Prescriptions   Medication Sig Dispense Refill   ??? LORazepam (ATIVAN) 2 mg tablet      ??? metFORMIN (GLUCOPHAGE) 1,000 mg tablet 1,000 mg two (2) times daily (with meals).     ??? ACCU-CHEK AVIVA PLUS TEST STRP strip      ??? metoprolol (LOPRESSOR) 25 mg tablet 25 mg daily.     ??? pravastatin (PRAVACHOL) 10 mg  tablet      ??? XARELTO 20 mg tab tablet 10 mg daily (with breakfast).         Review of Systems  Constitutional: Fever: No  Skin: Rash: No  HEENT: Hearing difficulty: No  Eyes: Blurred vision: No  Cardiovascular: Chest pain: Yes  Respiratory: Shortness of breath: No  Gastrointestinal: Nausea/vomiting: No  Musculoskeletal: Back pain: No  Neurological: Weakness: No  Psychological: Memory loss: No  Comments/additional findings:     Physical Examination:  Visit Vitals   ??? BP 122/74   ??? Ht 5\' 8"  (1.727 m)   ??? Wt 202 lb (91.6 kg)   ??? BMI 30.71 kg/m2     Constitutional: WDWN, Pleasant and appropriate affect, No acute distress.    CV: no lower leg swelling  Resp: normal respiratory effort  Musc:normal gait and station  Skin: no jaundice        Results for orders placed or performed in visit on 04/06/17   PROSTATE SPECIFIC ANTIGEN, TOTAL (PSA)   Result Value Ref Range    Prostate Specific Ag <0.03  0 - 4 ng/mL   AMB POC URINALYSIS DIP STICK AUTO W/O MICRO   Result Value Ref Range    Color (UA POC) Yellow     Clarity (UA POC) Clear     Glucose (UA POC) Negative Negative    Bilirubin (UA POC) Negative Negative    Ketones (UA POC) Negative Negative    Specific gravity (UA POC) 1.020 1.001 - 1.035    Blood (UA POC) Negative Negative    pH (UA POC) 6.0 4.6 - 8.0    Protein (UA POC) Negative Negative    Urobilinogen (UA POC) 0.2 mg/dL 0.2 - 1    Nitrites (UA POC) Negative Negative    Leukocyte esterase (UA POC) Negative Negative        Herbie Zellwood Mariela Rex MD   Urologic Oncologist  [facility] MD Profess[facility]   Professor of Urology  [place] [phone] Ext [phone]    UX:NATF Other, MD      Medical documentation provided with the assistance of Georges Mouse, medical scribe to Okey Regal Cissy Galbreath, MD on 04/06/2017.

## 2017-04-06 NOTE — Progress Notes (Signed)
[name] presents today for lab draw per Dr. Given order.    Patient will be notified with lab results.    PSA obtained via venipuncture without any difficulty.      Orders Placed This Encounter   ??? PROSTATE SPECIFIC ANTIGEN, TOTAL (PSA)   ??? COLLECTION VENOUS BLOOD,VENIPUNCTURE       Overton Mam

## 2017-04-12 NOTE — Progress Notes (Signed)
Letter sent

## 2017-05-01 NOTE — Progress Notes (Signed)
Patient Name: [name] ([id number])   CCM Ordered On: 11/15/2016   DOB: 06-Sep-1946 (71 year old)     Verbal Consent Obtained On:    Phone: [phone]      Date of Service: 05/01/2017   Address: [address]       Chronic Care Management     Current Medications   ? imipramine 50 mg tablet - Dispense: Tablet; TABLET ORAL  ? metformin 1,000 mg tablet - Dispense: Tablet; TABLET ORAL  ? metoprolol succinate ER 25 mg tablet,extended release 24 hr - Dispense: Tablet; Tablet Sustained Release 24 hr ORAL  ? pravastatin 10 mg tablet - Dispense: Tablet; TABLET ORAL  ? Xarelto 20 mg tablet - Dispense: Tablet; TABLET ORAL  Condition Management Care Plan  The physician has established and agreed upon a plan of care with the patient as outlined below. During the initial call on , a medical assistant obtained consent from the patient to receive CCM services and the patient was advised to call our office if they ever wish to opt out of or revoke CCM services. The medical assistant reviewed the Care Plan with the patient and provided an updated status on each element of the Care Plan.   Monthly Health Questions:   Chief Complaint: Prostate Cancer  Hypertension    Call Date: 05/01/2017. Call completed by Azell Der. Verbal consent to participate in the Chronic Care Management program was obtained prior to the call.  Start Time: 3:12 P.M. End Time: 3:32 P.M.   Time spent on the phone with patient: 15 minutes  Time spent on care coordination: 0 minutes  Time spent on documentation: 5 minutes    What is your email address? n/a   What is your email address? n/a   Can you receive text messages? Yes   Any recent insurance changes? No.   Do you have an emergency contact or caregiver? Yes Daughter   If yes, what is their name and phone number in case of an emergency? [phone]  [name] states he has been doing okay. He states he has been having trouble sleeping. He states he is up a lot throughout the night and uses  the restroom 2-3 times. He did state that he has been having some urine urgency at times, but he states when he has to go he goes. [name] he had surgery about a year ago for his Prostate Cancer, and hasn't had any urinary sx or problems. [name] states that he is still taking his BP medication daily and his average BP runs about 110/68 with a HR in the 60's. [name] [name] that he has no main symptoms or concerns at this time. I spent 15 mins talking with patient and 5 mins charting.    If we are ever unable to reach you for your monthly check-in, is it okay to send you information on care coordination? Yes.  On a scale of one to ten, with 10 being great and 0 being terrible, how have you been feeling lately from a physical standpoint? 8  pt did not elaborate.    Does not have pain today  On a scale of one to ten, with 10 being great and 0 being terrible, how have you been feeling lately from a psychosocial standpoint? 10  patient states he has been doing good from a mental standpoint.  On a scale of one to ten, with 10 being great and 0 being terrible, how active have you been? 7  patient did not elaborate.    Has not had any new symptoms since we spoke   Previous symptoms have not worsened   Has seen doctor(s) since last spoke :Dermatologist   Has not been to the ER since last spoke   Has not been hospitalized since last spoke  Current weight is 200. No changes in appetite    Has not had any fevers this past month   Has NOT fallen since last time we spoke   Has NOT had loss of balance or dizziness since we last spoke   Has NOT had new skin lesions noticed  Sleeping poorly :patient is awake most of the night. patient states he gets up about 2 times to use the bathroom.  Has been having regular bowel movements  Has had changes in medication   o Stopped on chlorestrol medicine      All medications currently filled   Taking medications as prescribed   Not having any problems or side effects from medications    Does NOT have an MD apt scheduled this month   Does NOT have any questions listed to ask your doctor at your next appointment   Not following recommended diet pt tries to eat healthy   Does not smoke   Last colonoscopy date: couple years ago   Gotten a flu or pneumonia shot this year.   Do you know when your last vision screening was? Yes 2-3 months ago   Cancer of the Prostate:   Care Plan Review:   o No weak or slow urinary stream   o No feeling of incomplete bladder emptying   o No urinary stream that starts and stops   o No difficulty starting urination   o Frequent or urgent urination? Yes - Same as last month :occ. when he knows he needs to go he goes   o Getting up frequently at night to urinate? Yes - Same as last month :2-3 times a night   o No continued dribbling of urine   o Course of treatment for Prostate Cancer: Prostate Cancer: 1 year ago he had surgery and had his Prostate removed.  Hypertension:   o Following a low salt diet pt states he tries to eat healthy.  Symptom Review:   ? no sx documented.   patient is still taking his BP medication daily.   patient states his BP runs 110/68 with his medicaiton, and 60's for his HR  patient states he checks his BP occ. not all the time  A copy of this updated Care Plan was made available to [name] Given to ensure ongoing monitoring by the physician.   A copy was also made available to the patient and their care team via the Patient Portal.  Diagnosis   ? C61: Malignant neoplasm of prostate  ? I10: Essential (primary) hypertension  Procedures   ? 93235: Chronic care management services, at least 20 minutes of clinical staff time directed by a physician or other qualified health care professional, per calendar month.  The above conditions were addressed with the patient today.  The combination and progression of these chronic conditions have the ability to negatively impact the patient's overall quality of life or place the  patient at risk of acute exacerbation or functional decline.      ~~This report was copied and pasted from the servicing EHR system.~~

## 2017-05-29 NOTE — Progress Notes (Signed)
[name] Name: [name] ([id number]) CCM Ordered On: 11/15/2016   DOB: August 14, 1946 (71 year old) Verbal Consent Obtained On: 04/24/2017   Phone: [phone] Date of Service: 05/29/2017   Address: [address]     Chronic Care Management     Current Medications   imipramine 50 mg tablet - Dispense: Tablet; TABLET ORAL   metformin 1,000 mg tablet - Dispense: Tablet; TABLET ORAL   metoprolol succinate ER 25 mg tablet,extended release 24 hr - Dispense: Tablet; Tablet Sustained Release 24 hr ORAL   pravastatin 10 mg tablet - Dispense: Tablet; TABLET ORAL   Xarelto 20 mg tablet - Dispense: Tablet; TABLET ORAL  Condition Management Care Plan  The physician has established and agreed upon a plan of care with the [name] as outlined below. During the initial call on 04/24/2017, a medical assistant obtained consent from the [name] to receive CCM services and the [name] was advised to call our office if they ever wish to opt out of or revoke CCM services. The medical assistant reviewed the Care Plan with the [name] and provided an updated status on each element of the Care Plan.   Monthly Health Questions:   Chief Complaint: Prostate Cancer  Hypertension    Call Date: 05/29/2017. Call completed by Azell Der. Verbal consent to participate in the Chronic Care Management program was obtained prior to the call.  Start Time: 3:12 P.M. End Time: 3:32 P.M.   Time spent on the phone with [name]: 9 minutes  Time spent on care coordination: 0 minutes  Time spent on documentation: 11 minutes    What is your email address? n/a   What is your email address? n/a   Can you receive text messages? Yes   Any recent insurance changes? No.   Do you have an emergency contact or caregiver? Yes Daughter   If yes, what is their name and phone number in case of an emergency? [phone]  [name] states he has been doing okay. He states he has been feeling more worn out lately and he thinks that is because of his medications.  [name] stated that he hasn't had any urinary changes since last month. He stated he is still not sleeping very good and that if he wakes up throughout the night he uses the bathroom. He stated that he has been checking his BP about every other day and it has been running normal. [name] stated that he is still taking all his medications prescribed to him. [name] had no additional health concerns at this time.  I spent 9 mins talking with [name], and 11 mins updating chart.    If we are ever unable to reach you for your monthly check-in, is it okay to send you information on care coordination? Yes.  [name] stated he has felt worn out from medications/but has been okay.  No numerical value given.    Does not have pain today  [name] states he has been doing okay.  No numerical value given.  [name] runs around a lot throughout the day. Takes his grandkids to appts, school events.  No numerical value given.    Has not had any new symptoms since we spoke   Previous symptoms have not worsened   Has not seen any doctor(s) since last spoke   Has not been to the ER since last spoke   Has not been hospitalized since last spoke  Current weight is 200. No changes in appetite    Has not had  any fevers this past month   Has NOT fallen since last time we spoke   Has NOT had loss of balance or dizziness since we last spoke   Has NOT had new skin lesions noticed  Sleeping poorly :[name] is awake most of the night. [name] states he gets up about 2 times to use the bathroom.  Has been having regular bowel movements  Has not had any changes in medication    All medications currently filled   Taking medications as prescribed   Not having any problems or side effects from medications   Does NOT have an MD apt scheduled this month   Does NOT have any questions listed to ask your doctor at your next appointment   Not following recommended diet pt tries to eat healthy   Does not smoke   Last colonoscopy date: couple years ago    Gotten a flu or pneumonia shot this year.   Do you know when your last vision screening was? Yes 2-3 months ago   Cancer of the Prostate:   Care Plan Review:   No weak or slow urinary stream   No feeling of incomplete bladder emptying   No urinary stream that starts and stops   No difficulty starting urination   Frequent or urgent urination? Yes - Better than last month :occ. when he knows he needs to go he goes   Getting up frequently at night to urinate? Yes - Same as last month :2x a night   No continued dribbling of urine   Course of treatment for Prostate Cancer: Prostate Cancer: 1 year ago he had surgery and had his Prostate removed.  [name] stated that his urinary frequency has improved this month. He stated that he isn't feeling like he has to go to the bathroom a lot throughout the day. [name] said that he doesn't sleep very well and if he happens to be awake at night he does usually get up to go to the bathroom, but stated that urinary symptoms don't wake him up to go he just goes to the bathroom if he is awake at night.  Hypertension:   Care Plan Review:   Doctor recommended regular check of BP  Following a low salt diet pt states he tries to eat healthy.  Symptom Review:   No symptoms this month.   [name] is still taking his BP medication daily. [name] states his BP runs pretty normal with his medicaiton and his HR has been normal still running about in the 60s. [name] stated that he does check his BP every other day.  A copy of this updated Care Plan was made available to [name] Given to ensure ongoing monitoring by the physician.   A copy was also made available to the [name] and their care team via the [name] Portal.  Diagnosis   C61: Malignant neoplasm of prostate   I10: Essential (primary) hypertension  Procedures   99490: Chronic care management services, at least 20 minutes of clinical staff time directed by a physician or other qualified health care professional, per calendar month.   The above conditions were addressed with the [name] today. The combination and progression of these chronic conditions have the ability to negatively impact the [name]'s overall quality of life or place the [name] at risk of acute exacerbation or functional decline.     ~~This report was copied and pasted from the servicing EHR system.~~

## 2017-06-25 ENCOUNTER — Emergency Department (HOSPITAL_COMMUNITY)
Admission: EM | Admit: 2017-06-25 | Discharge: 2017-06-26 | Disposition: A | Discharge status: Home / Self Care | Payer: Medicare Other | Attending: Emergency Medicine | Admitting: Emergency Medicine

## 2017-06-25 ENCOUNTER — Encounter (HOSPITAL_COMMUNITY): Payer: Self-pay | Admitting: Emergency Medicine

## 2017-06-25 DIAGNOSIS — I4892 Unspecified atrial flutter: Secondary | ICD-10-CM | POA: Diagnosis not present

## 2017-06-25 DIAGNOSIS — R002 Palpitations: Secondary | ICD-10-CM | POA: Diagnosis present

## 2017-06-25 HISTORY — DX: Unspecified atrial fibrillation: I48.91

## 2017-06-25 LAB — CBC
HCT: 35.9 % — ABNORMAL LOW (ref 39.0–52.0)
HEMOGLOBIN: 12.1 g/dL — AB (ref 13.0–17.0)
MCH: 29.8 pg (ref 26.0–34.0)
MCHC: 33.7 g/dL (ref 30.0–36.0)
MCV: 88.4 fL (ref 78.0–100.0)
PLATELETS: 171 10*3/uL (ref 150–400)
RBC: 4.06 MIL/uL — ABNORMAL LOW (ref 4.22–5.81)
RDW: 13.9 % (ref 11.5–15.5)
WBC: 7.3 10*3/uL (ref 4.0–10.5)

## 2017-06-25 MED ORDER — SODIUM CHLORIDE 0.9 % IV BOLUS (SEPSIS)
1000.0000 mL | Freq: Once | INTRAVENOUS | Status: AC
  Administered 2017-06-26: 1000 mL via INTRAVENOUS

## 2017-06-25 NOTE — ED Triage Notes (Signed)
Pt presents from [facility] with [facility] for feeling fluttering/palpitations in chest at 9pm when lying down for bed; pt denies pain; reports 2 episodes in the past (april-- cardioverted); takes xarelto; pt here from [facility]
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
## 2017-06-26 ENCOUNTER — Telehealth (HOSPITAL_COMMUNITY): Payer: Self-pay | Admitting: *Deleted

## 2017-06-26 ENCOUNTER — Emergency Department (HOSPITAL_COMMUNITY): Payer: Medicare Other

## 2017-06-26 DIAGNOSIS — I4892 Unspecified atrial flutter: Secondary | ICD-10-CM | POA: Diagnosis not present

## 2017-06-26 LAB — I-STAT TROPONIN, ED: Troponin i, poc: 0 ng/mL (ref 0.00–0.08)

## 2017-06-26 LAB — BASIC METABOLIC PANEL
ANION GAP: 10 (ref 5–15)
BUN: 20 mg/dL (ref 6–20)
CALCIUM: 8.7 mg/dL — AB (ref 8.9–10.3)
CO2: 23 mmol/L (ref 22–32)
CREATININE: 0.88 mg/dL (ref 0.61–1.24)
Chloride: 104 mmol/L (ref 101–111)
Glucose, Bld: 135 mg/dL — ABNORMAL HIGH (ref 65–99)
Potassium: 4 mmol/L (ref 3.5–5.1)
Sodium: 137 mmol/L (ref 135–145)

## 2017-06-26 MED ORDER — METOPROLOL TARTRATE 5 MG/5ML IV SOLN
5.0000 mg | Freq: Once | INTRAVENOUS | Status: AC
  Administered 2017-06-26: 5 mg via INTRAVENOUS
  Filled 2017-06-26: qty 5

## 2017-06-26 MED ORDER — PROPOFOL 10 MG/ML IV BOLUS
INTRAVENOUS | Status: AC | PRN
  Administered 2017-06-26 (×2): 20 mg via INTRAVENOUS

## 2017-06-26 MED ORDER — PROPOFOL 10 MG/ML IV BOLUS
40.0000 mg | Freq: Once | INTRAVENOUS | Status: AC
  Administered 2017-06-26: 20 mg via INTRAVENOUS
  Filled 2017-06-26: qty 20

## 2017-06-26 NOTE — Telephone Encounter (Signed)
vcml not set up; pt referred to afib clinic from ER

## 2017-06-26 NOTE — Sedation Documentation (Signed)
50J discharged with R wave sync on; pt sucessfully cardioverted to NSR Rate of 87

## 2017-06-26 NOTE — ED Notes (Signed)
Patient transported to X-ray 

## 2017-06-26 NOTE — Telephone Encounter (Signed)
Formatting of this note might be different from the original.  vcml not set up; pt referred to afib clinic from ER  Electronically signed by Iona Hansen, CMA at 06/26/2017  3:04 PM EDT

## 2017-07-03 NOTE — Progress Notes (Signed)
Patient Name: [name] ([id number]) CCM Ordered On: 11/15/2016   DOB: 1946-10-03 (71 year old) Verbal Consent Obtained On: 04/24/2017   Phone: [phone] Date of Service: 07/03/2017   Address: [address]     Chronic Care Management     Current Medications   imipramine 50 mg tablet - Dispense: Tablet; TABLET ORAL   lisinopril 5 mg tablet - Dispense: Tablet; TABLET ORAL   metformin 1,000 mg tablet - Dispense: Tablet; TABLET ORAL   metoprolol succinate ER 25 mg tablet,extended release 24 hr - Dispense: Tablet; Tablet Sustained Release 24 hr ORAL   pravastatin 10 mg tablet - Dispense: Tablet; TABLET ORAL   Xarelto 20 mg tablet - Dispense: Tablet; TABLET ORAL  Care Coordination  The physician has established and agreed upon a plan of care with the patient as outlined below. During the initial call on 04/24/2017, a medical assistant obtained consent from the patient to receive CCM services and the patient was advised to call our office if they ever wish to opt out of or revoke CCM services. The medical assistant reviewed the Care Plan with the patient and provided an updated status on each element of the Care Plan.  Spoke with the patient and nothing has changed. The phone call lasted a total of 2 minutes.   Chief Complaint: BPH, HTN   Medication Reconciliation and Management:   A coupon from [employer] was generated for [name] today and mailed to him. This was done to lower the overall cost of his medications in hopes of increasing compliance.   A medication history check was performed today. Medication history provides access to a patient's medication history based on information provided by the Pharmacy Benefit Manager (PBM) and/or Pharmacy Fill data. This information was used to reconcile and update the patient's medication list.   Based on information gathered, the patient's medication list has been updated to include any new medications that have been filled since the  prior discussion. See medication list for details.   A total of 14 minutes was spent this month coordinating care related to the patient's medications.  Patient Education Provided: Based on [name]'s medications, problem list, and unique socio-economic factors, educational documents were printed and mailed to the patient to better inform him of these items. Below is a list of documents that were provided:   Blood Pressure Log   Exercising to help with an Enlarged Prostate   A total of 4 minutes was spent this month providing patient-specific education documents.  A copy of this updated Care Plan was made available to [name] to ensure ongoing monitoring by the physician.   A copy was also made available to the patient and their care team via the Patient Portal.   Diagnosis   N401: Enlarged prostate with lower urinary tract symptoms   I10: Essential (primary) hypertension  Procedures   99490: Chronic care management services, at least 20 minutes of clinical staff time directed by a physician or other qualified health care professional, per calendar month.  The above conditions were addressed with the patient today. The combination and progression of these chronic conditions have the ability to negatively impact the patient's overall quality of life or place the patient at risk of acute exacerbation or functional decline.       ~~This report was copied and pasted from the servicing EHR system.~~

## 2017-07-05 ENCOUNTER — Inpatient Hospital Stay

## 2017-07-05 ENCOUNTER — Emergency Department: Admit: 2017-07-05 | Payer: MEDICARE | Primary: Internal Medicine

## 2017-07-05 ENCOUNTER — Inpatient Hospital Stay
Admit: 2017-07-05 | Discharge: 2017-07-06 | Disposition: A | Discharge status: Home / Self Care | Payer: MEDICARE | Attending: Internal Medicine | Admitting: Internal Medicine

## 2017-07-05 DIAGNOSIS — I480 Paroxysmal atrial fibrillation: Secondary | ICD-10-CM

## 2017-07-05 LAB — METABOLIC PANEL, BASIC
Anion gap: 8 mmol/L (ref 5–15)
BUN: 15 mg/dl (ref 7–25)
CO2: 26 mEq/L (ref 21–32)
Calcium: 8.7 mg/dl (ref 8.5–10.1)
Chloride: 107 mEq/L (ref 98–107)
Creatinine: 0.8 mg/dl (ref 0.6–1.3)
GFR est AA: >60
GFR est non-AA: >60
Glucose: 123 mg/dl — ABNORMAL HIGH (ref 74–106)
Potassium: 3.8 mEq/L (ref 3.5–5.1)
Sodium: 141 mEq/L (ref 136–145)

## 2017-07-05 LAB — CBC WITH AUTOMATED DIFF
BASOPHILS: 0.6 % (ref 0–3)
EOSINOPHILS: 4.5 % (ref 0–5)
HCT: 33.7 % — ABNORMAL LOW (ref 37.0–50.0)
HGB: 11.6 gm/dl — ABNORMAL LOW (ref 12.4–17.2)
IMMATURE GRANULOCYTES: 0.4 % (ref 0.0–3.0)
LYMPHOCYTES: 37.4 % (ref 28–48)
MCH: 30.6 pg (ref 23.0–34.6)
MCHC: 34.4 gm/dl (ref 30.0–36.0)
MCV: 88.9 fL (ref 80.0–98.0)
MONOCYTES: 9.3 % (ref 1–13)
MPV: 10.3 fL — ABNORMAL HIGH (ref 6.0–10.0)
NEUTROPHILS: 47.8 % (ref 34–64)
NRBC: 0 (ref 0–0)
PLATELET: 178 10*3/uL (ref 140–450)
RBC: 3.79 M/uL — ABNORMAL LOW (ref 3.80–5.70)
RDW-SD: 44.1 — ABNORMAL HIGH (ref 35.1–43.9)
WBC: 6.7 10*3/uL (ref 4.0–11.0)

## 2017-07-05 LAB — GLUCOSE, POC
Glucose (POC): 119 mg/dL — ABNORMAL HIGH (ref 65–105)
Glucose (POC): 116 mg/dL — ABNORMAL HIGH (ref 65–105)
Glucose (POC): 139 mg/dL — ABNORMAL HIGH (ref 65–105)
Glucose (POC): 130 mg/dL — ABNORMAL HIGH (ref 65–105)

## 2017-07-05 LAB — TROPONIN I
Troponin-I: <0.015 ng/ml (ref 0.000–0.045)
Troponin-I: <0.015 ng/ml (ref 0.000–0.045)
Troponin-I: <0.015 ng/ml (ref 0.000–0.045)
Troponin-I: <0.015 ng/ml (ref 0.000–0.045)

## 2017-07-05 LAB — TSH 3RD GENERATION: TSH: 2.52 u[IU]/mL (ref 0.358–3.740)

## 2017-07-05 LAB — T4, FREE: Free T4: 0.84 ng/dl (ref 0.76–1.46)

## 2017-07-05 MED ORDER — METOPROLOL TARTRATE 25 MG TAB
25 mg | Freq: Every day | ORAL | Status: DC
Start: 2017-07-05 — End: 2017-07-06
  Administered 2017-07-05 – 2017-07-06 (×2): via ORAL

## 2017-07-05 MED ORDER — RIVAROXABAN 20 MG TAB
20 mg | Freq: Every day | ORAL | Status: DC
Start: 2017-07-05 — End: 2017-07-06
  Administered 2017-07-05: 21:00:00 via ORAL

## 2017-07-05 MED ORDER — INSULIN LISPRO 100 UNIT/ML INJECTION
100 unit/mL | SUBCUTANEOUS | Status: DC | PRN
Start: 2017-07-05 — End: 2017-07-06

## 2017-07-05 MED ORDER — INSULIN GLARGINE 100 UNIT/ML INJECTION
100 unit/mL | Freq: Every evening | SUBCUTANEOUS | Status: DC
Start: 2017-07-05 — End: 2017-07-06
  Administered 2017-07-06: 01:00:00 via SUBCUTANEOUS

## 2017-07-05 MED ORDER — PRAVASTATIN 10 MG TAB
10 mg | Freq: Every evening | ORAL | Status: DC
Start: 2017-07-05 — End: 2017-07-06
  Administered 2017-07-06: 01:00:00 via ORAL

## 2017-07-05 MED ORDER — DILTIAZEM 250 MG/250 ML (1 MG/ML) IN DEXTROSE 5 % IV
1 mg/mL | INTRAVENOUS | Status: DC
Start: 2017-07-05 — End: 2017-07-05
  Administered 2017-07-05: 10:00:00 via INTRAVENOUS

## 2017-07-05 MED ORDER — ACETAMINOPHEN (TYLENOL) SOLUTION 32MG/ML
ORAL | Status: DC | PRN
Start: 2017-07-05 — End: 2017-07-06

## 2017-07-05 MED ORDER — DILTIAZEM HCL 5 MG/ML IV SOLN
5 mg/mL | INTRAVENOUS | Status: AC
Start: 2017-07-05 — End: 2017-07-05
  Administered 2017-07-05: 08:00:00 via INTRAVENOUS

## 2017-07-05 MED ORDER — GLUCAGON 1 MG INJECTION
1 mg | INTRAMUSCULAR | Status: DC | PRN
Start: 2017-07-05 — End: 2017-07-06

## 2017-07-05 MED ORDER — ACETAMINOPHEN 325 MG TABLET
325 mg | ORAL | Status: DC | PRN
Start: 2017-07-05 — End: 2017-07-06

## 2017-07-05 MED ORDER — DILTIAZEM 250 MG/250 ML (1 MG/ML) IN DEXTROSE 5 % IV
1 mg/mL | INTRAVENOUS | Status: DC
Start: 2017-07-05 — End: 2017-07-05
  Administered 2017-07-05: 08:00:00 via INTRAVENOUS

## 2017-07-05 MED ORDER — ACETAMINOPHEN 650 MG RECTAL SUPPOSITORY
650 mg | RECTAL | Status: DC | PRN
Start: 2017-07-05 — End: 2017-07-06

## 2017-07-05 MED ORDER — TRAMADOL 50 MG TAB
50 mg | Freq: Four times a day (QID) | ORAL | Status: DC | PRN
Start: 2017-07-05 — End: 2017-07-06
  Administered 2017-07-06: 01:00:00 via ORAL

## 2017-07-05 MED ORDER — DILTIAZEM HCL 5 MG/ML IV SOLN
5 mg/mL | INTRAVENOUS | Status: DC
Start: 2017-07-05 — End: 2017-07-05
  Administered 2017-07-05: 09:00:00 via INTRAVENOUS

## 2017-07-05 MED ORDER — DEXTROSE 50% IN WATER (D50W) IV
INTRAVENOUS | Status: DC | PRN
Start: 2017-07-05 — End: 2017-07-06

## 2017-07-05 MED ORDER — NALOXONE 0.4 MG/ML INJECTION
0.4 mg/mL | INTRAMUSCULAR | Status: DC | PRN
Start: 2017-07-05 — End: 2017-07-06

## 2017-07-05 MED ORDER — INSULIN LISPRO 100 UNIT/ML INJECTION
100 unit/mL | Freq: Four times a day (QID) | SUBCUTANEOUS | Status: DC
Start: 2017-07-05 — End: 2017-07-06
  Administered 2017-07-05 – 2017-07-06 (×5): via SUBCUTANEOUS

## 2017-07-05 MED ORDER — PNEUMOCOCCAL 13-VAL CONJ VACCINE-DIP CRM (PF) 0.5 ML IM SYRINGE
0.5 mL | INTRAMUSCULAR | Status: AC
Start: 2017-07-05 — End: 2017-07-06
  Administered 2017-07-06: 16:00:00 via INTRAMUSCULAR

## 2017-07-05 MED ORDER — DILTIAZEM HCL 5 MG/ML IV SOLN
5 mg/mL | INTRAVENOUS | Status: DC
Start: 2017-07-05 — End: 2017-07-05
  Administered 2017-07-05 (×2): via INTRAVENOUS

## 2017-07-05 MED FILL — METOPROLOL TARTRATE 25 MG TAB: 25 mg | ORAL | Qty: 1

## 2017-07-05 MED FILL — DILTIAZEM HCL 5 MG/ML IV SOLN: 5 mg/mL | INTRAVENOUS | Qty: 25

## 2017-07-05 MED FILL — DILTIAZEM HCL 5 MG/ML IV SOLN: 5 mg/mL | INTRAVENOUS | Qty: 5

## 2017-07-05 MED FILL — XARELTO 20 MG TABLET: 20 mg | ORAL | Qty: 1
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
## 2017-07-05 NOTE — Progress Notes (Signed)
PAGER ID: [phone]   MESSAGE: [id number] [name], [name] is here for Afib w RVR. He would like to know if he can eat breakfast? Danley Danker, [place]

## 2017-07-05 NOTE — Progress Notes (Signed)
Patient admitted on 07/05/2017 from home with   Chief Complaint   Patient presents with   ??? Palpitations     Hx.A-fib.    Cardiology has been consulted.     The patient has been admitted to the hospital 0 times in the past 12 months.      Tentative dc plan: Home with family and physician follow up.     Anticipated Discharge Date:    PCP: Maylon Peppers, MD     Specialists:  Urology and Cardiology    Dialysis Unit: NA    Ph[facility]: [facility]    DME: None    Home Environment: [place] [address] [phone]. Lives with family (his daughter, son in law, and 4 grandkids) in a multilevel home with FROG- his room and bathroom are in the [place]. He is the primary driver in the house- his son in law recently got transferred to [place] and his daughter is legally blind so he does all of the driving. Independent with ADLs and Mobility. Anticipates moving to [place] at the end of the school year.     Prior to admission open services: None    [place] Emergency Contact Information  Primary Emergency Contact: Hancock,Krissy E  Address: [place] [address] [place] Phone: [phone]  Mobile Phone: [phone]  Relation: Daughter  Secondary Emergency Contact: [place] Phone: [phone]  Relation: Child     Transportation: family will transport home    Therapy Recommendations: NA    OT =     PT =     SLP =      RT Home O2 Evaluation =  NA    Wound Care =  NA    [facility] ([facility] Consulted: No    Case Management Assessment        Preferred Language for Healthcare Related Communication     Preferred Language for Healthcare Related Communication: English   Spiritual/Ethnic/Cultural/Religious Needs that Should be [place] in Past 12 Months: No               Decline in Gait/Transfer/Balance: No        Decline in Capacity to Feed/Dress/Bathe: No   Developmental Delay: No   Chewing/Swallowing Problems: No      DYSPHAGIA SCREENING                          Difficulty with Secretions     Difficulty with Secretions: No      Speech Slurred/Thick/Garbled     Speech Slurred/Thick/Garbled: No      ABUSE/NEGLECT SCREENING   Physical Abuse/Neglect: Denies   Sexual Abuse: Denies   Sexual Abuse: Denies   Other Abuse/Issues: Denies          PRIMARY DECISION MAKER   Primary Decision Maker Name: self                  Secondary Decision Maker Name: Benedetto Coons    Secondary Decision Maker Name: Benedetto Coons        Secondary Decision Maker Relationship to Patient: Adult child      [place] (ACP) DOCUMENTS   Confirm Advance  Directive: Yes, not on file              Suicide/Psychosocial Screening   Primary Diagnosis or Primary Complaint of an Emotional Behavior Disorder: No   Patient is Currently Experiencing Depression: No   Suicidal Ideation/Attempts: No   Homicidal Ideation/Attempts: No   Alcohol/Drug Intoxication: No   Hallucinations/Delusions: No   Pending, Active, or Temporary Detention Orders: No   Aggressive/Inappropriate Behavior: No      SAD PERSONS                                                  READMIT RISK TOOL   Support Systems: Child(ren)   Relationship with Primary Physician Group: Seen at least one time within past 12 months   History of Falls Within Past 3 Months: No   Needs Assistance with Wound Care AND/OR Mgnt of O2, Nebulizer: No   Requires Financial, Physical and/or Educational Assistance With Medications: No   History of Mental Illness: No   Living Alone: No      CARE MANAGEMENT INTERVENTIONS       PCP Verified by CM: Yes           Mode of Transport at Discharge: Self       Transition of Care Consult (CM Consult): Discharge Planning                   Physical Therapy Consult: No   Occupational Therapy Consult: No   Speech Therapy Consult: No    Current Support Network: Lives with Spouse, Family Lives Nearby   Reason for Referral: DCP Rounds   History Provided By: Patient, Medical Record, Physician   Patient Orientation: Alert and Oriented, Person, Place, Situation, Self   Cognition: Alert   Support System Response: Unavailable   Previous Living Arrangement: Lives with Family Independent   Home Accessibility: Multi Level Home, Steps   Prior Functional Level: Independent in ADLs/IADLs   Current Functional Level: Independent in ADLs/IADLs   Primary Language: English   Can patient return to prior living arrangement: Yes   Ability to make needs known:: Good   Family able to assist with home care needs:: No               Types of Needs Identified: Disease Management Education, Treatment Education, Nutrition/Diet, Emotional Support               Plan discussed with Pt/Family/Caregiver: Yes   Freedom of Choice Offered: Yes      DISCHARGE LOCATION   Discharge Placement: Home

## 2017-07-05 NOTE — Other (Signed)
TRANSFER - IN REPORT:    Verbal report received from [name](name) on [name]  being received from ED(unit) for routine progression of care      Report consisted of patient???s Situation, Background, Assessment and   Recommendations(SBAR).     Information from the following report(s) SBAR, Kardex, ED Summary, MAR, Recent Results and Cardiac Rhythm Afib was reviewed with the receiving nurse.    Opportunity for questions and clarification was provided.      Assessment completed upon patient???s arrival to unit and care assumed.

## 2017-07-05 NOTE — Progress Notes (Signed)
Problem: Falls - Risk of  Goal: *Absence of Falls  Document Schmid Fall Risk and appropriate interventions in the flowsheet.   Outcome: Progressing Towards Goal  Fall Risk Interventions:            Medication Interventions: Teach patient to arise slowly

## 2017-07-05 NOTE — Other (Signed)
TRANSFER - OUT REPORT:    Verbal report given to [place], RN(name) on [name]  being transferred to [facility](unit) for routine progression of care       Report consisted of patient???s Situation, Background, Assessment and   Recommendations(SBAR).     Information from the following report(s) SBAR and ED Summary was reviewed with the receiving nurse.    Lines:   Peripheral IV 07/05/17 Left Antecubital (Active)   Site Assessment Clean, dry, & intact 07/05/2017  5:28 AM   Phlebitis Assessment 0 07/05/2017  5:28 AM   Infiltration Assessment 0 07/05/2017  5:28 AM   Dressing Status Clean, dry, & intact 07/05/2017  5:28 AM   Hub Color/Line Status Green 07/05/2017  5:28 AM        Opportunity for questions and clarification was provided.      Patient transported with:   Registered Nurse

## 2017-07-05 NOTE — ED Provider Notes (Signed)
[facility]  Emergency Department Treatment Report    Patient: [name] Age: 71 y.o. Sex: male    Date of Birth: [birth year] Admit Date: 07/05/2017 PCP: Maylon Peppers, MD   MRN: [id number]  CSN: [id number]  Attending: Hortense Ramal, MD   Room: ER26/ER26 Time Dictated: 5:19 AM APP:  Virgina Jock, PA-C     Chief Complaint   Palpitations   History of Present Illness   71 y.o. male Presents complaining of palpitations and right-sided chest pain which started at 0 130 this morning.  Patient has a history of paroxysmal atrial fibrillation and currently on Xarelto.  He denies shortness of breath, difficulty breathing, nausea, vomiting or diaphoresis.  Denies fever/chills, cough or congestion.  Denies associated abdominal pain.    Review of Systems     Constitutional: No fever, chills  Eyes: No visual symptoms.  ENT: No sore throat, runny nose  Respiratory: No cough, dyspnea or wheezing.  Cardiovascular: Positive chest pain, palpitations,   Gastrointestinal: No vomiting, diarrhea or abdominal pain.  Genitourinary: No dysuria or  frequency  Musculoskeletal: No lower extremity swelling  Integumentary: No rashes.  Neurological: No headaches  Denies complaints in all other systems.    Past Medical/Surgical History     Past Medical History:   Diagnosis Date   ??? A-fib ([place])    ??? Benign prostatic hyperplasia with lower urinary tract symptoms    ??? BPH (benign prostatic hypertrophy) with urinary obstruction    ??? Diabetes mellitus ([place])    ??? Elevated prostate specific antigen (PSA)    ??? Enlarged prostate with lower urinary tract symptoms (LUTS)    ??? History of needle biopsy of prostate with negative result    ??? History of urinary retention    ??? Hypercholesteremia    ??? Hypertrophy of prostate with urinary obstruction and other lower urinary tract symptoms (LUTS)    ??? 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 Urinary retention    ??? UTI (lower urinary tract infection)      Past Surgical History:   Procedure Laterality Date   ??? HX CYST REMOVAL     ??? HX PROSTATECTOMY  05/04/2016    DVP Dr Given   ??? HX UROLOGICAL  01/20/2016    PNBx-TRUS Vol 70cc's, Gleason,4+3 occupying core and 50% of the Bx  material, 3+4 present in cores and occupying 30% of the Bx specimen, 4+3 present in cores and occupying 40% of the Bx specimen, 3+4 presen tin core and occupying 35% of the Bx material, Dr Wynona Luna        Social History     Social History     Social History   ??? Marital status: DIVORCED     Spouse name: N/A   ??? Number of children: N/A   ??? Years of education: N/A     Social History Main Topics   ??? Smoking status: Never Smoker   ??? Smokeless tobacco: Never Used   ??? Alcohol use No   ??? Drug use: No   ??? Sexual activity: Not Asked     Other Topics Concern   ??? None     Social History Narrative       Family History     Family History   Problem Relation Age of Onset   ??? Diabetes Mother    ???  Hypertension Mother    ??? Diabetes Sister    ??? Hypertension Sister    ??? Stroke Sister        Home Medications     Prior to Admission Medications   Prescriptions Last Dose Informant Patient Reported? Taking?   ACCU-CHEK AVIVA PLUS TEST STRP strip   Yes No   LORazepam (ATIVAN) 2 mg tablet   Yes No   XARELTO 20 mg tab tablet   Yes Yes   Sig: 10 mg daily (with breakfast).   metFORMIN (GLUCOPHAGE) 1,000 mg tablet   Yes No   Sig: 1,000 mg two (2) times daily (with meals).   metoprolol (LOPRESSOR) 25 mg tablet   Yes No   Sig: 25 mg daily.   pravastatin (PRAVACHOL) 10 mg tablet Not Taking at Unknown time  Yes No   Sig: Pt states he stopped taking since July due to "unable to walk"      Facility-Administered Medications: None       Allergies   No Known Allergies    Physical Exam   ED Triage Vitals   ED Encounter Vitals Group      BP 07/05/17 0332 135/77      Pulse (Heart Rate) 07/05/17 0332 133       Resp Rate 07/05/17 0332 22      Temp 07/05/17 0332 98.2 ??F (36.8 ??C)      Temp src --       O2 Sat (%) 07/05/17 0332 97 %      Weight 07/05/17 0332 200 lb      Height 07/05/17 0332 5\' 8"        Constitutional: Patient appears well developed and well nourished. Appearance and behavior are age and situation appropriate.  HEENT: Conjunctiva clear. PERRLA. Mucous membranes moist, non-erythematous. Surface of the pharynx, palate, and tongue are pink, moist and without lesions.  Neck: supple, non tender, symmetrical, no masses or JVD. No lymphadenopathy.  Respiratory: lungs clear to auscultation, nonlabored respirations. No tachypnea or accessory muscle use.  Cardiovascular: Irregular rate and rhythm without murmur rubs or gallops.    Gastrointestinal:  Abdomen soft, nondistended, nontender without complaint of pain to palpation, bowel sounds present, no CVA tenderness noted bilaterally  Musculoskeletal: Calves soft and non-tender. Distal pulses 2+ and equal bilaterally.  No peripheral edema or significant variscosities.   Integumentary: warm and dry without rashes or lesions  Neurologic: alert and oriented. No facial asymmetry or dysarthria. Moving all extremities well    Impression and Management Plan   71 y.o. male presents for evaluation of palpitations and chest pain.  Patient is afebrile his vital signs reveal tachycardia, rest of his vital signs are stable.  He does appear to be in an irregular rhythm on my exam.  We will obtain EKG, chest x-ray and appropriate labs for further evaluation.    Diagnostic Studies   Lab:   Recent Results (from the past 12 hour(s))   CBC WITH AUTOMATED DIFF    Collection Time: 07/05/17  3:50 AM   Result Value Ref Range    WBC 6.7 4.0 - 11.0 1000/mm3    RBC 3.79 (L) 3.80 - 5.70 M/uL    HGB 11.6 (L) 12.4 - 17.2 gm/dl    HCT 33.7 (L) 37.0 - 50.0 %    MCV 88.9 80.0 - 98.0 fL    MCH 30.6 23.0 - 34.6 pg    MCHC 34.4 30.0 - 36.0 gm/dl    PLATELET 178 140 - 450 1000/mm3  MPV 10.3 (H) 6.0 - 10.0 fL    RDW-SD 44.1 (H) 35.1 - 43.9      NRBC 0 0 - 0      IMMATURE GRANULOCYTES 0.4 0.0 - 3.0 %    NEUTROPHILS 47.8 34 - 64 %    LYMPHOCYTES 37.4 28 - 48 %    MONOCYTES 9.3 1 - 13 %    EOSINOPHILS 4.5 0 - 5 %    BASOPHILS 0.6 0 - 3 %   METABOLIC PANEL, BASIC    Collection Time: 07/05/17  3:50 AM   Result Value Ref Range    Sodium 141 136 - 145 mEq/L    Potassium 3.8 3.5 - 5.1 mEq/L    Chloride 107 98 - 107 mEq/L    CO2 26 21 - 32 mEq/L    Glucose 123 (H) 74 - 106 mg/dl    BUN 15 7 - 25 mg/dl    Creatinine 0.8 0.6 - 1.3 mg/dl    GFR est AA >60      GFR est non-AA >60      Calcium 8.7 8.5 - 10.1 mg/dl    Anion gap 8 5 - 15 mmol/L   TROPONIN I    Collection Time: 07/05/17  3:50 AM   Result Value Ref Range    Troponin-I <0.015 0.000 - 0.045 ng/ml     EKG???atrial fibrillation with rapid ventricular response, vent rate 102 bpm  Imaging:   Chest x-ray shows no acute abnormalities as read by ED physician Dr. Truddie Crumble   ED Course/Medical Decision Making   Patient remained stable throughout his stay in the ED with no new or worsening symptoms.  EKG shows atrial fibrillation with RVR.  IV Cardizem 20 mg bolus was given with 5 mg/hr drip initiated.  Heart rate improved to the 80???90 range and blood pressure remained stable.  Chest pain resolved.  Patient is currently asymptomatic.  We have discussed this patient with [facility] hospitalist, Dr. Rosalene Billings, who kindly accepts this patient to his service  Medications   dilTIAZem 1 mg/mL in dextrose 5% 250 mL infusion (not administered)   dilTIAZem (CARDIZEM) injection 20 mg (20 mg IntraVENous Given 07/05/17 0415)     Final Diagnosis        ICD-10-CM ICD-9-CM   1. Atrial fibrillation, unspecified type ([place]) I48.91 427.31   2. Chest pain, unspecified type R07.9 786.50       Disposition   Patient admitted to telemetry      Current Discharge Medication List        The patient was fully discussed with Hortense Ramal, MD who agrees with the  above assessment and plan      Ronette Deter PA-C  July 05, 2017    My signature above authenticates this document and my orders, the final ??  diagnosis (es), discharge prescription (s), and instructions in the Epic ??  record.  If you have any questions please contact [phone].  ??  Nursing notes have been reviewed by the physician/ advanced practice ??  Clinician.

## 2017-07-05 NOTE — H&P (Signed)
Admission History and Physical      [name]  [birth year]  [id number]  PCP: Maylon Peppers, MD    Chief Complaint   Patient presents with   ??? Palpitations     Hx.A-fib.       HPI:      [name] is a 71 y.o. male with a history of Podiatry to reevaluate patient today.  For text atrial fibrillation, diabetes mellitus, hypertension, hyperlipidemia presented to hospital with palpitations.  Patient says he woke up last night to go to bathroom.  After coming back he felt some pain in the neck, took some Tylenol.  Patient then started feeling palpitations, he checked his vital signs and heart rate was elevated keep getting worse.  Patient also had right-sided chest pain.  Grades the pain 3/10, pain gets worse when heart rate goes up.  No particular relieving factor.  Shortness of breath.  No dizziness.  No loss of consciousness  Patient says he had episode of A. fib 10 days ago and needed DC cardioversion in [place]       Past Medical History:   Diagnosis Date   ??? A-fib ([place])    ??? Benign prostatic hyperplasia with lower urinary tract symptoms    ??? BPH (benign prostatic hypertrophy) with urinary obstruction    ??? Diabetes mellitus ([place])    ??? Elevated prostate specific antigen (PSA)    ??? Enlarged prostate with lower urinary tract symptoms (LUTS)    ??? History of needle biopsy of prostate with negative result    ??? History of urinary retention    ??? Hypercholesteremia    ??? Hypertrophy of prostate with urinary obstruction and other lower urinary tract symptoms (LUTS)    ??? 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 Urinary retention    ??? UTI (lower urinary tract infection)      Past Surgical History:   Procedure Laterality Date   ??? HX CYST REMOVAL     ??? HX PROSTATECTOMY  05/04/2016    DVP Dr Given   ??? HX UROLOGICAL  01/20/2016    PNBx-TRUS Vol 70cc's, Gleason,4+3 occupying core and 50% of the Bx   material, 3+4 present in cores and occupying 30% of the Bx specimen, 4+3 present in cores and occupying 40% of the Bx specimen, 3+4 presen tin core and occupying 35% of the Bx material, Dr Wynona Luna      Social History     Social History   ??? Marital status: DIVORCED     Spouse name: N/A   ??? Number of children: N/A   ??? Years of education: N/A     Occupational History   ??? Not on file.     Social History Main Topics   ??? Smoking status: Never Smoker   ??? Smokeless tobacco: Never Used   ??? Alcohol use No   ??? Drug use: No   ??? Sexual activity: Not on file     Other Topics Concern   ??? Not on file     Social History Narrative     Family History   Problem Relation Age of Onset   ??? Diabetes Mother    ??? Hypertension Mother    ??? Diabetes Sister    ??? Hypertension Sister    ??? Stroke Sister      No Known Allergies  Home Medications:     Prior to Admission Medications   Prescriptions Last Dose Informant Patient Reported? Taking?   ACCU-CHEK AVIVA PLUS TEST STRP strip   Yes No   LORazepam (ATIVAN) 2 mg tablet Not Taking at Unknown time  Yes No   XARELTO 20 mg tab tablet 07/04/2017 at 2030  Yes Yes   Sig: 20 mg daily (with dinner).   lisinopril (PRINIVIL, ZESTRIL) 5 mg tablet 07/04/2017 at 2030  Yes Yes   Sig: Take 5 mg by mouth daily. Indications: hypertension   metFORMIN (GLUCOPHAGE) 1,000 mg tablet 07/04/2017 at Unknown time  Yes Yes   Sig: 1,000 mg two (2) times daily (with meals).   metoprolol (LOPRESSOR) 25 mg tablet 07/04/2017 at 0800  Yes Yes   Sig: 25 mg daily.   pravastatin (PRAVACHOL) 10 mg tablet Not Taking at Unknown time  Yes No   Sig: Pt states he stopped taking since July due to "unable to walk"      Facility-Administered Medications: None       Review of Systems   Constitutional: Negative for fever, chills, weight loss, malaise/fatigue and diaphoresis.   HENT: Negative for ear pain, congestion and sore throat.   Eyes: Negative for blurred vision, double vision and redness.    Respiratory: Negative for shortness of breath. Negative for cough, hemoptysis, sputum production and wheezing.   Cardiovascular: Positive for right-sided chest pain and palpitations  Gastrointestinal: Negative for heartburn, nausea, abdominal pain, diarrhea, blood in stool and melena.   Genitourinary: Negative for dysuria, urgency, frequency and hematuria.   Musculoskeletal: Negative for myalgias, back pain, joint pain and falls.   Skin: Negative for rash.   Neurological: Negative for dizziness, tingling, tremors, speech change, seizures, loss of consciousness, weakness and headaches.   Endo/Heme/Allergies: Negative for polydipsia. Does not bruise/bleed easily.   Psychiatric/Behavioral: Negative for hallucinations and memory loss. The patient does not have insomnia    Physical Assessment:     Patient Vitals for the past 12 hrs:   Temp Pulse Resp BP SpO2   07/05/17 1127 - (!) 110 - 115/70 -   07/05/17 1102 98.2 ??F (36.8 ??C) 87 18 91/59 96 %   07/05/17 0747 97.9 ??F (36.6 ??C) 88 18 108/66 96 %   07/05/17 0628 98.2 ??F (36.8 ??C) (!) 102 18 112/72 98 %   07/05/17 0532 - 82 16 116/73 96 %   07/05/17 0529 - 81 15 - 96 %   07/05/17 0527 - 94 15 - 96 %   07/05/17 0525 - 88 19 - 97 %   07/05/17 0523 - 86 17 - 93 %   07/05/17 0521 - 88 23 - 97 %   07/05/17 0519 - 91 18 - 96 %   07/05/17 0517 - 92 21 - 95 %   07/05/17 0515 - 88 16 - 96 %   07/05/17 0513 - 87 15 - 97 %   07/05/17 0511 - 91 17 - 97 %   07/05/17 0501 - 75 14 113/71 97 %   07/05/17 0448 - 83 17 - 97 %   07/05/17 0447 - 93 20 - 93 %   07/05/17 0446 - 83 19 - 97 %   07/05/17 0445 - 88 17 108/56 95 %   07/05/17 0332 98.2 ??F (36.8 ??C) (!) 133 22 135/77 97 %       Constitutional:  oriented to person, place, and time and well-developed, well-nourished, and in no distress.   HENT:  Head: Normocephalic and atraumatic.   Nose: Nose normal.   Mouth/Throat: Oropharynx is clear and moist.   Eyes: Conjunctivae and EOM are normal. Pupils are equal, round, and  reactive to light.   Neck: Neck supple.   Cardiovascular: S1, S2 irregular  Pulmonary/Chest: Effort normal and breath sounds normal.   Abdominal: Soft. Bowel sounds are normal.   Musculoskeletal: Normal range of motion. No clubbing or cyanosis. Pedal pulses 2+ b/l. ??Capillary refill normal.  Neurological: Alert and oriented to person, place, and time. Gait normal.   Skin: Skin is warm and dry.   Psychiatric: Memory, affect and judgment normal    Recent Results (from the past 24 hour(s))   CBC WITH AUTOMATED DIFF    Collection Time: 07/05/17  3:50 AM   Result Value Ref Range    WBC 6.7 4.0 - 11.0 1000/mm3    RBC 3.79 (L) 3.80 - 5.70 M/uL    HGB 11.6 (L) 12.4 - 17.2 gm/dl    HCT 33.7 (L) 37.0 - 50.0 %    MCV 88.9 80.0 - 98.0 fL    MCH 30.6 23.0 - 34.6 pg    MCHC 34.4 30.0 - 36.0 gm/dl    PLATELET 178 140 - 450 1000/mm3    MPV 10.3 (H) 6.0 - 10.0 fL    RDW-SD 44.1 (H) 35.1 - 43.9      NRBC 0 0 - 0      IMMATURE GRANULOCYTES 0.4 0.0 - 3.0 %    NEUTROPHILS 47.8 34 - 64 %    LYMPHOCYTES 37.4 28 - 48 %    MONOCYTES 9.3 1 - 13 %    EOSINOPHILS 4.5 0 - 5 %    BASOPHILS 0.6 0 - 3 %   METABOLIC PANEL, BASIC    Collection Time: 07/05/17  3:50 AM   Result Value Ref Range    Sodium 141 136 - 145 mEq/L    Potassium 3.8 3.5 - 5.1 mEq/L    Chloride 107 98 - 107 mEq/L    CO2 26 21 - 32 mEq/L    Glucose 123 (H) 74 - 106 mg/dl    BUN 15 7 - 25 mg/dl    Creatinine 0.8 0.6 - 1.3 mg/dl    GFR est AA >60      GFR est non-AA >60      Calcium 8.7 8.5 - 10.1 mg/dl    Anion gap 8 5 - 15 mmol/L   TROPONIN I    Collection Time: 07/05/17  3:50 AM   Result Value Ref Range    Troponin-I <0.015 0.000 - 0.045 ng/ml   TROPONIN I    Collection Time: 07/05/17  6:29 AM   Result Value Ref Range    Troponin-I <0.015 0.000 - 0.045 ng/ml   GLUCOSE, POC    Collection Time: 07/05/17  6:32 AM   Result Value Ref Range    Glucose (POC) 119 (H) 65 - 105 mg/dL   GLUCOSE, POC    Collection Time: 07/05/17  7:58 AM   Result Value Ref Range     Glucose (POC) 116 (H) 65 - 105 mg/dL   TROPONIN I    Collection Time: 07/05/17  9:23 AM   Result Value Ref Range    Troponin-I <0.015 0.000 - 0.045 ng/ml   TSH 3RD GENERATION    Collection Time: 07/05/17  9:23 AM   Result Value Ref Range    TSH 2.520 0.358 - 3.740 uIU/mL   T4, FREE  Collection Time: 07/05/17  9:23 AM   Result Value Ref Range    Free T4 0.84 0.76 - 1.46 ng/dl   GLUCOSE, POC    Collection Time: 07/05/17 11:57 AM   Result Value Ref Range    Glucose (POC) 139 (H) 65 - 105 mg/dL       All Micro Results     None          Imaging:    Xr Chest Pa Lat    Result Date: 07/05/2017  INDICATION: chest pain   EXAMINATION: XR CHEST PA LAT COMPARISON: 02/05/2017 FINDINGS: The study shows a normal sized heart.. The lungs are clear and well expanded.       IMPRESSION: Normal chest       Echo Results  (Last 48 hours)    None                 Impression:   Atrial fibrillation with RVR  Diabetes mellitus type 2  Hypertension  Hyperlipidemia        Plan:   Patient on telemetry  Started on Cardizem drip  Resume Xarelto  Serial troponins  TSH and free T4  Discussed with cardiology.  Started on Glucomander protocol  Patient has history of dyslipidemia but intolerant to statins due to myalgia.  Check fasting lipid panel      Dragon medical dictation software was used for portions of this report. Unintended errors may occur.   Clearnce Sorrel, MD  07/05/2017 1:14 PM

## 2017-07-05 NOTE — Other (Signed)
Bedside and Verbal shift change report given to Ebony (Soil scientist) by Watt Climes (offgoing nurse). Report included the following information SBAR.

## 2017-07-05 NOTE — Progress Notes (Signed)
2d echo done

## 2017-07-05 NOTE — ED Notes (Signed)
Awaiting for Cardizem drip from Pharmacy at this time, message sent to send STAT
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
## 2017-07-06 LAB — LIPID PANEL
CHOL/HDL Ratio: 4.3 Ratio (ref 0.0–5.0)
Cholesterol, total: 176 mg/dl (ref 140–199)
HDL Cholesterol: 41 mg/dl (ref 40–96)
LDL, calculated: 97 mg/dl (ref 0–130)
Triglyceride: 188 mg/dl — ABNORMAL HIGH (ref 29–150)

## 2017-07-06 LAB — GLUCOSE, POC
Glucose (POC): 113 mg/dL — ABNORMAL HIGH (ref 65–105)
Glucose (POC): 117 mg/dL — ABNORMAL HIGH (ref 65–105)
Glucose (POC): 154 mg/dL — ABNORMAL HIGH (ref 65–105)

## 2017-07-06 LAB — HEMOGLOBIN A1C W/O EAG: Hemoglobin A1c: 6.7 % — ABNORMAL HIGH (ref 4.2–6.3)

## 2017-07-06 MED FILL — PREVNAR 13 (PF) 0.5 ML INTRAMUSCULAR SYRINGE: 0.5 mL | INTRAMUSCULAR | Qty: 0.5

## 2017-07-06 MED FILL — PRAVASTATIN 10 MG TAB: 10 mg | ORAL | Qty: 1

## 2017-07-06 MED FILL — METOPROLOL TARTRATE 25 MG TAB: 25 mg | ORAL | Qty: 1

## 2017-07-06 MED FILL — TRAMADOL 50 MG TAB: 50 mg | ORAL | Qty: 1

## 2017-07-06 NOTE — Discharge Summary (Signed)
DISCHARGE SUMMARY     Patient Name: [name]  Medical Record Number: [id number]  Date of Birth: 1946/06/07  Discharge Provider: Clearnce Sorrel, MD  Primary Care Provider: Maylon Peppers, MD    Admit date: 07/05/2017  Discharge Date:  07/21/17 Time: 4:41 PM  Discharge Disposition: Home  Code Status: Full Code    Follow-up appointments:     Follow-up Information     Follow up With Details Comments Soldier, MD Schedule an appointment as soon as possible for a visit in 1 week Please call to schedule 1 week follow up appointment, office is closed today [address]  [place] [address]  [phone]      Daisy Lazar, MD Go on 07/16/2017 At 11:00am [address]  [address]  [place] [address]  [phone]             Follow-up recommendations:   Follow-up with PCP and cardiology  Discharge diagnosis:  1. Primary diagnosis           Atrial fibrillation [facility])                    Hospital Problems as of 07-21-17  Date Reviewed: 07-21-17          Codes Class Noted - Resolved POA    * (Principal)Atrial fibrillation ([place]) ICD-10-CM: I48.91  ICD-9-CM: 427.31  07/05/2017 - Present Unknown        Chest pain ICD-10-CM: R07.9  ICD-9-CM: 786.50  07/05/2017 - Present Unknown                                            Past Medical History:   Diagnosis Date   ??? A-fib ([place])    ??? Benign prostatic hyperplasia with lower urinary tract symptoms    ??? BPH (benign prostatic hypertrophy) with urinary obstruction    ??? Diabetes mellitus ([place])    ??? Elevated prostate specific antigen (PSA)    ??? Enlarged prostate with lower urinary tract symptoms (LUTS)    ??? History of needle biopsy of prostate with negative result    ??? History of urinary retention    ??? Hypercholesteremia    ??? Hypertrophy of prostate with urinary obstruction and other lower urinary tract symptoms (LUTS)    ??? Prostate cancer ([place])      stage T1c Gleason 4+3 (7) adenocarcinoma of the prostate in 7/19 cores involving 30-50%, s/p MRI fusion prostate  biopsy 01/20/16   ??? Urinary retention    ??? UTI (lower urinary tract infection)                                                         Discharge Medications:                 Discharge Medication List as of July 21, 2017 12:15 PM      CONTINUE these medications which have NOT CHANGED    Details   lisinopril (PRINIVIL, ZESTRIL) 5 mg tablet Take 5 mg by mouth daily. Indications: hypertension, Historical Med      metFORMIN (GLUCOPHAGE) 1,000 mg tablet 1,000 mg two (2) times daily (with meals)., Historical Med  metoprolol (LOPRESSOR) 25 mg tablet 25 mg daily., Historical Med      XARELTO 20 mg tab tablet 20 mg daily (with dinner)., Historical Med      LORazepam (ATIVAN) 2 mg tablet Historical Med      ACCU-CHEK AVIVA PLUS TEST STRP strip Historical Med      pravastatin (PRAVACHOL) 10 mg tablet Pt states he stopped taking since July due to "unable to walk", Historical Med             Discharge Diet:            Diet: Cardiac Diet and Diabetic Diet    Consultants: Cardiology    Procedures:   * No surgery found *        Most Recent BMP and CBC:    Recent Labs      07/05/17   0350   BUN  15   NA  141   CO2  26     Recent Labs      07/05/17   0350   WBC  6.7   RBC  3.79*   HCT  33.7*   MCV  88.9   MCH  30.6   MCHC  34.4       Imaging:    Xr Chest Pa Lat    Result Date: 07/05/2017  INDICATION: chest pain   EXAMINATION: XR CHEST PA LAT COMPARISON: 02/05/2017 FINDINGS: The study shows a normal sized heart.. The lungs are clear and well expanded.       IMPRESSION: Normal chest       Echo Results  (Last 48 hours)               07/05/17 1551  2D ECHO COMPLETE ADULT WO CONTRAST Final result    Narrative:                                                                   Study ID: [id number]                                                     [facility]                                                     [address] [place], [place]                                 Adult Echocardiogram Report       Name: GIAVANNI, ODONOVAN Date:      07/05/2017 02:13 PM   MRN:  [id number]                 Patient Location: [id number]   DOB: October 31, 1945             Age: 3 yrs   Height: 68 in               Weight: 201 lb                        BSA: 2.0 m2   BP: 108/66 mmHg   Gender: Male                Account #: [id number]   Reason For Study: afib, chest pain   History: HX OF [place]. AFIB, BPH, DM, HYPERCHOLESTERMIA   Ordering Physician: Clearnce Sorrel   Performed By: Hart [place]ons., RDCS       Interpretation Summary   A two-dimensional transthoracic echocardiogram, with color flow Doppler was   performed.   The study was technically adequate.   Normal LV size and systolic function with calculated EF of 61%. No regional   wall motion abnormalities.   Cannot assess diastolic function due to arrhythmia.   RV size and function normal.   No significant valvular disease.   Unable to estimate RVSP due to inadequate TR jet.       Prior study dated 07/15/2017 reported LV ejection fraction of 55%.       _____________________________________________________________________________   _           Left Ventricle   The left ventricle is normal in size. There is normal left ventricular wall   thickness. Left ventricular systolic function is normal. The calculated left   ventricular ejection fraction is 61%. Difficult to assess diastolic function   due to arrhythmia. The left ventricular wall motion is normal.           Right Ventricle   The right ventricle is normal size. Right ventricular systolic function is   normal: TAPSE: 1.9cm.       Atria   The left atrium is mildly dilated. The right atrium is mildly dilated. The   interatrial septum is intact with no evidence for an atrial septal defect.       Mitral Valve   The mitral valve leaflets appear normal. There is no evidence of stenosis,   fluttering, or prolapse. There is no mitral valve stenosis. There is trace   mitral  regurgitation.       Tricuspid Valve   The tricuspid valve is normal. There is no tricuspid stenosis. There is   trivial tricuspid regurgitation. Insufficient Tricuspid regurgitation to   evaluate for PASP/ RVSP.   Aortic Valve   The aortic valve is normal in structure and function. No aortic stenosis. No   aortic regurgitation is present.       Pulmonic Valve   The pulmonic valve is not well visualized. There is no pulmonic valvular   stenosis. Trace pulmonic valvular regurgitation.       Great Vessels   The aortic root is normal size.       Pericardium/Pleural   There is no pericardial effusion.       MEASUREMENTS/CALCULATIONS:       MMode/2D Measurements & Calculations   IVSd: 1.0 cm                          LVIDd: 5.4 cm  LVIDs: 3.8 cm                                         LVPWd: 1.0 cm          _______________________________________________________________       FS: 28.8 %                            RVDd minor: 3.9 cm          _______________________________________________________________       Ao root diam: 3.4 cm                  EF(sp4-el): 63.3 %   Ao root area: 9.0 cm   LA dimension: 4.2 cm          _______________________________________________________________                                             SV(sp4-el): 45.5 ml   EF(MOD-bp): 61.1 %          _______________________________________________________________       TAPSE: 1.9 cm                         LAV (MOD-bp) Index: 27.5 ml/m2              _______________________________________________________________   LAV(MOD-bp): 56.2 ml                  LAV(MOD-sp2): 48.8 ml              _______________________________________________________________   LAV(MOD-sp4): 56.5 ml                 RAV(MOD-sp4): 35.3 ml              _______________________________________________________________   RAV(MOD-sp4) Index: 17.2 ml/m2       Doppler Measurements & Calculations   MV A dur: 0.12 sec                     MV dec  time: 0.17 sec   MV E max vel: 79.1 cm/sec   MV A max vel: 57.7 cm/sec   MV E/A: 1.4   LV IVRT: 0.09 sec              _______________________________________________________________   Lat Peak E' Vel: 14.1 cm/sec           Med Peak E' Vel: 8.5 cm/sec              _______________________________________________________________   Ao V2 max: 124.2 cm/sec                LV V1 max PG: 3.6 mmHg   Ao max PG: 6.2 mmHg                    LV V1 max: 94.6 cm/sec   Ao V2 mean: 78.7 cm/sec   Ao mean PG: 2.9 mmHg              _______________________________________________________________   TR max vel: 260.6 cm/sec               RAP systole: 8.0 mmHg   TR max PG: 27.2 mmHg  RVSP(TR): 35.2 mmHg              _______________________________________________________________   Fatima Sanger A Revs Vel: 33.7 cm/sec           RV S Vel: 13.2 cm/sec   Pulm A Revs Dur: 0.15 sec              _______________________________________________________________   AVA Dim Index: 0.76                    E/E' lat: 5.6          _______________________________________________________________       E/E' med: 9.3               Electronically signed byDr Maurice Small, MD   07/05/2017 03:51 PM                 History of presenting illness:( Per admitting M.D.)  CARROL BONDAR is a 71 y.o. male with a history of Podiatry to reevaluate patient today.  For text atrial fibrillation, diabetes mellitus, hypertension, hyperlipidemia presented to hospital with palpitations.  Patient says he woke up last night to go to bathroom.  After coming back he felt some pain in the neck, took some Tylenol.  Patient then started feeling palpitations, he checked his vital signs and heart rate was elevated keep getting worse.  Patient also had right-sided chest pain.  Grades the pain 3/10, pain gets worse when heart rate goes up.  No particular relieving factor.  Shortness of breath.  No dizziness.  No loss of consciousness  Patient says he had episode of A. fib 10 days ago and needed  DC cardioversion in [facility] course:   Paroxysmal atrial fibrillation with RVR  Chest pain due to A. fib.  Acute MI ruled out  Diabetes mellitus type 2 uncontrolled with hyperglycemia  Hypertension  Hyperlipidemia  Obesity with BMI 51.13    71 year old gentleman admitted to hospital with atrial fibrillation and chest pain.  Noted on Cardizem drip.  Serial troponins ordered.  Kept on telemetry.  Cardiology consulted.  Continued on Xarelto.  Echocardiogram ordered.  EF 61%.  Patient converted to sinus rhythm.  Discussed with cardiology recommended no need to change medication he can be discharged home and follow-up as outpatient  Patient was otherwise feeling stable.  Vital signs were normal  Discussed with patient he needs to be compliant with medicine and follow-up as outpatient    Vitals on Discharge:  Visit Vitals   ??? BP 128/78 (BP 1 Location: Right arm, BP Patient Position: Supine)   ??? Pulse 69   ??? Temp 98.1 ??F (36.7 ??C)   ??? Resp 18   ??? Ht 5\' 8"  (1.727 m)   ??? Wt 91.8 kg (202 lb 6.1 oz)   ??? SpO2 99%   ??? BMI 30.77 kg/m2       Physical Exam:  Constitutional:??Awake and alert, NAD  HENT:??atraumatic, normocephalic, oropharynx clear ??  Eyes:??????conjunctiva normal  Neck:??????supple and trachea normal  Cardiovascular:?? Regular rate and rhythm, heart sounds normal, intact distal pulses  Pulmonary/Chest Wall: breath sounds normal and effort normal  Abdominal:????????????appearance normal, soft, non-tender upon palpation,bowel sounds normal.  Neurological:??????awake, alert and oriented, CN intact, no focal neuro deficits, moves all extremities equally .  Extremities:???? No clubbing or cyanosis. Pedal pulses 2+ b/l.       Discharge condition:  improved    Discharge activity and restrictions: Activity as tolerated    Time spent  with discharging patient:   Discharge plan discussed with pt. All questions answered. Need for compliance with medicine and follow up emphasized. Pt verbalized the understanding of care. Time spent 35  minutes.      Clearnce Sorrel, MD  07/06/2017  4:41 PM    Copies: Maylon Peppers, MD

## 2017-07-06 NOTE — Discharge Summary (Signed)
DISCHARGE SUMMARY     Patient Name: [name]  Medical Record Number: [id number]  Date of Birth: 01-Jul-1946  Discharge Provider: Clearnce Sorrel, MD  Primary Care Provider: Maylon Peppers, MD    Admit date: 07/05/2017  Discharge Date:  Jul 31, 2017 Time: 4:41 PM  Discharge Disposition: Home  Code Status: Full Code    Follow-up appointments:     Follow-up Information     Follow up With Details Comments Aristes, MD Schedule an appointment as soon as possible for a visit in 1 week Please call to schedule 1 week follow up appointment, office is closed today [address]  [place] [address]  [phone]      Daisy Lazar, MD Go on 07/16/2017 At 11:00am [address]  [address]  [place] [address]  [phone]             Follow-up recommendations:   Follow-up with PCP and cardiology  Discharge diagnosis:  1. Primary diagnosis           Atrial fibrillation [facility])                    Hospital Problems as of 07-31-2017  Date Reviewed: Jul 31, 2017          Codes Class Noted - Resolved POA    * (Principal)Atrial fibrillation ([place]) ICD-10-CM: I48.91  ICD-9-CM: 427.31  07/05/2017 - Present Unknown        Chest pain ICD-10-CM: R07.9  ICD-9-CM: 786.50  07/05/2017 - Present Unknown                                            Past Medical History:   Diagnosis Date   ??? A-fib ([place])    ??? Benign prostatic hyperplasia with lower urinary tract symptoms    ??? BPH (benign prostatic hypertrophy) with urinary obstruction    ??? Diabetes mellitus ([place])    ??? Elevated prostate specific antigen (PSA)    ??? Enlarged prostate with lower urinary tract symptoms (LUTS)    ??? History of needle biopsy of prostate with negative result    ??? History of urinary retention    ??? Hypercholesteremia    ??? Hypertrophy of prostate with urinary obstruction and other lower urinary tract symptoms (LUTS)    ??? 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 Urinary retention    ??? UTI (lower urinary tract infection)                                                         Discharge Medications:                 Discharge Medication List as of July 31, 2017 12:15 PM      CONTINUE these medications which have NOT CHANGED    Details   lisinopril (PRINIVIL, ZESTRIL) 5 mg tablet Take 5 mg by mouth daily. Indications: hypertension, Historical Med      metFORMIN (GLUCOPHAGE) 1,000 mg tablet 1,000 mg two (2) times daily (with meals)., Historical Med  metoprolol (LOPRESSOR) 25 mg tablet 25 mg daily., Historical Med      XARELTO 20 mg tab tablet 20 mg daily (with dinner)., Historical Med      LORazepam (ATIVAN) 2 mg tablet Historical Med      ACCU-CHEK AVIVA PLUS TEST STRP strip Historical Med      pravastatin (PRAVACHOL) 10 mg tablet Pt states he stopped taking since July due to "unable to walk", Historical Med             Discharge Diet:            Diet: Cardiac Diet and Diabetic Diet    Consultants: Cardiology    Procedures:   * No surgery found *        Most Recent BMP and CBC:    Recent Labs      07/05/17   0350   BUN  15   NA  141   CO2  26     Recent Labs      07/05/17   0350   WBC  6.7   RBC  3.79*   HCT  33.7*   MCV  88.9   MCH  30.6   MCHC  34.4       Imaging:    Xr Chest Pa Lat    Result Date: 07/05/2017  INDICATION: chest pain   EXAMINATION: XR CHEST PA LAT COMPARISON: 02/05/2017 FINDINGS: The study shows a normal sized heart.. The lungs are clear and well expanded.       IMPRESSION: Normal chest       Echo Results  (Last 48 hours)               07/05/17 1551  2D ECHO COMPLETE ADULT WO CONTRAST Final result    Narrative:                                                                   Study ID: [id number]                                                     [facility]                                                     [address] [place], [place]                                Adult Echocardiogram Report       Name: [name], [name] Date:      07/05/2017 02:13 PM   MRN:  [id number]                 Patient Location: [id number]   DOB: [birth year]             Age: 71 yrs   Height: 68 in               Weight: 201 lb                        BSA: 2.0 m2   BP: 108/66 mmHg   Gender: Male                Account #: [id number]   Reason For Study: afib, chest pain   History: HX OF [place]. AFIB, BPH, DM, HYPERCHOLESTERMIA   Ordering Physician: Clearnce Sorrel   Performed By: Hart Robinsons., RDCS       Interpretation Summary   A two-dimensional transthoracic echocardiogram, with color flow Doppler was   performed.   The study was technically adequate.   Normal LV size and systolic function with calculated EF of 61%. No regional   wall motion abnormalities.   Cannot assess diastolic function due to arrhythmia.   RV size and function normal.   No significant valvular disease.   Unable to estimate RVSP due to inadequate TR jet.       Prior study dated 07/15/2017 reported LV ejection fraction of 55%.       _____________________________________________________________________________   _           Left Ventricle   The left ventricle is normal in size. There is normal left ventricular wall   thickness. Left ventricular systolic function is normal. The calculated left   ventricular ejection fraction is 61%. Difficult to assess diastolic function   due to arrhythmia. The left ventricular wall motion is normal.           Right Ventricle   The right ventricle is normal size. Right ventricular systolic function is   normal: TAPSE: 1.9cm.       Atria   The left atrium is mildly dilated. The right atrium is mildly dilated. The   interatrial septum is intact with no evidence for an atrial septal defect.       Mitral Valve   The mitral valve leaflets appear normal. There is no evidence of stenosis,    fluttering, or prolapse. There is no mitral valve stenosis. There is trace   mitral regurgitation.       Tricuspid Valve   The tricuspid valve is normal. There is no tricuspid stenosis. There is   trivial tricuspid regurgitation. Insufficient Tricuspid regurgitation to   evaluate for PASP/ RVSP.   Aortic Valve   The aortic valve is normal in structure and function. No aortic stenosis. No   aortic regurgitation is present.       Pulmonic Valve   The pulmonic valve is not well visualized. There is no pulmonic valvular   stenosis. Trace pulmonic valvular regurgitation.       Great Vessels   The aortic root is normal size.       Pericardium/Pleural   There is no pericardial effusion.       MEASUREMENTS/CALCULATIONS:       MMode/2D Measurements & Calculations   IVSd: 1.0 cm                          LVIDd: 5.4 cm  LVIDs: 3.8 cm                                         LVPWd: 1.0 cm          _______________________________________________________________       FS: 28.8 %                            RVDd minor: 3.9 cm          _______________________________________________________________       Ao root diam: 3.4 cm                  EF(sp4-el): 63.3 %   Ao root area: 9.0 cm   LA dimension: 4.2 cm          _______________________________________________________________                                             SV(sp4-el): 45.5 ml   EF(MOD-bp): 61.1 %          _______________________________________________________________       TAPSE: 1.9 cm                         LAV (MOD-bp) Index: 27.5 ml/m2              _______________________________________________________________   LAV(MOD-bp): 56.2 ml                  LAV(MOD-sp2): 48.8 ml              _______________________________________________________________   LAV(MOD-sp4): 56.5 ml                 RAV(MOD-sp4): 35.3 ml              _______________________________________________________________   RAV(MOD-sp4) Index: 17.2 ml/m2        Doppler Measurements & Calculations   MV A dur: 0.12 sec                     MV dec time: 0.17 sec   MV E max vel: 79.1 cm/sec   MV A max vel: 57.7 cm/sec   MV E/A: 1.4   LV IVRT: 0.09 sec              _______________________________________________________________   Lat Peak E' Vel: 14.1 cm/sec           Med Peak E' Vel: 8.5 cm/sec              _______________________________________________________________   Ao V2 max: 124.2 cm/sec                LV V1 max PG: 3.6 mmHg   Ao max PG: 6.2 mmHg                    LV V1 max: 94.6 cm/sec   Ao V2 mean: 78.7 cm/sec   Ao mean PG: 2.9 mmHg              _______________________________________________________________   TR max vel: 260.6 cm/sec               RAP systole: 8.0 mmHg   TR max PG: 27.2 mmHg  RVSP(TR): 35.2 mmHg              _______________________________________________________________   Fatima Sanger A Revs Vel: 33.7 cm/sec           RV S Vel: 13.2 cm/sec   Pulm A Revs Dur: 0.15 sec              _______________________________________________________________   AVA Dim Index: 0.76                    E/E' lat: 5.6          _______________________________________________________________       E/E' med: 9.3               Electronically signed byDr Maurice Small, MD   07/05/2017 03:51 PM                 History of presenting illness:( Per admitting M.D.)  [name] is a 71 y.o. male with a history of Podiatry to reevaluate patient today.  For text atrial fibrillation, diabetes mellitus, hypertension, hyperlipidemia presented to hospital with palpitations.  Patient says he woke up last night to go to bathroom.  After coming back he felt some pain in the neck, took some Tylenol.  Patient then started feeling palpitations, he checked his vital signs and heart rate was elevated keep getting worse.  Patient also had right-sided chest pain.  Grades the pain 3/10, pain gets worse when heart rate goes up.  No  particular relieving factor.  Shortness of breath.  No dizziness.  No loss of consciousness  Patient says he had episode of A. fib 10 days ago and needed DC cardioversion in [facility] course:   Paroxysmal atrial fibrillation with RVR  Chest pain due to A. fib.  Acute MI ruled out  Diabetes mellitus type 2 uncontrolled with hyperglycemia  Hypertension  Hyperlipidemia  Obesity with BMI 65.30    71 year old gentleman admitted to hospital with atrial fibrillation and chest pain.  Noted on Cardizem drip.  Serial troponins ordered.  Kept on telemetry.  Cardiology consulted.  Continued on Xarelto.  Echocardiogram ordered.  EF 61%.  Patient converted to sinus rhythm.  Discussed with cardiology recommended no need to change medication he can be discharged home and follow-up as outpatient  Patient was otherwise feeling stable.  Vital signs were normal  Discussed with patient he needs to be compliant with medicine and follow-up as outpatient    Vitals on Discharge:  Visit Vitals   ??? BP 128/78 (BP 1 Location: Right arm, BP Patient Position: Supine)   ??? Pulse 69   ??? Temp 98.1 ??F (36.7 ??C)   ??? Resp 18   ??? Ht 5\' 8"  (1.727 m)   ??? Wt 91.8 kg (202 lb 6.1 oz)   ??? SpO2 99%   ??? BMI 30.77 kg/m2       Physical Exam:  Constitutional:??Awake and alert, NAD  HENT:??atraumatic, normocephalic, oropharynx clear ??  Eyes:??????conjunctiva normal  Neck:??????supple and trachea normal  Cardiovascular:?? Regular rate and rhythm, heart sounds normal, intact distal pulses  Pulmonary/Chest Wall: breath sounds normal and effort normal  Abdominal:????????????appearance normal, soft, non-tender upon palpation,bowel sounds normal.  Neurological:??????awake, alert and oriented, CN intact, no focal neuro deficits, moves all extremities equally .  Extremities:???? No clubbing or cyanosis. Pedal pulses 2+ b/l.       Discharge condition:  improved    Discharge activity and restrictions: Activity as tolerated    Time spent  with discharging patient:    Discharge plan discussed with pt. All questions answered. Need for compliance with medicine and follow up emphasized. Pt verbalized the understanding of care. Time spent 35 minutes.      Clearnce Sorrel, MD  07/06/2017  4:41 PM    Copies: Maylon Peppers, MD

## 2017-07-06 NOTE — Progress Notes (Signed)
[facility] PROGRESS NOTE    Recs:  ?? Converted to SR yesterday early evening, feels much better this AM  ?? Continue home metoprolol  ?? Continue Xarelto for anticoagulation  ?? Echo 9/26 showed LVEF of 61% without any WMA, no significant valvular disease  ?? Pt is clinically improved and HD stable, and ok to be discharged home today. He already has [place] office appointment on 10/8 that he will keep  ?? Discussed red flags with patient, should AFib recur, he will call for re-evaluation to discuss possible ablation vs Tikosyn vs re-attempt with DCCV    Assessment:  [name] is a 71 y.o. male  with     1. Recurrent atrial fibrillation with RVR  2. Hx AF/AFL s/p recent DCCV 06/25/17 of AFL to NSR in [place], [place]  3. DM2  4. HLD. Intolerant to statins   5. Hx prostate cancer s/p surgery 7/17  ??  Prior cardiac testing:   Echo 11/23/16: EF 55%, no significant valvular dx, RVSP 32 mmHg   Lexiscan NST 07/16/14: EF 64%, no evidence of ischemia      Subjective:  No acute events overnight  Feels well this AM    ROS:  Review of Systems - Respiratory ROS: no cough, shortness of breath, or wheezing  Gastrointestinal ROS: no abdominal pain, change in bowel habits, or black or bloody stools  Musculoskeletal ROS: negative  Neurological ROS: no TIA or stroke symptoms  O/w 10/14 systems negative    Medications:  Current Facility-Administered Medications   Medication Dose Route Frequency   ??? metoprolol tartrate (LOPRESSOR) tablet 25 mg  25 mg Oral DAILY   ??? pravastatin (PRAVACHOL) tablet 10 mg  10 mg Oral QHS   ??? rivaroxaban (XARELTO) tablet 20 mg  20 mg Oral DAILY WITH DINNER   ??? insulin glargine (LANTUS) injection 1-100 Units  1-100 Units SubCUTAneous QHS   ??? insulin lispro (HUMALOG) injection 1-100 Units  1-100 Units SubCUTAneous AC&HS        VS:   Visit Vitals   ??? BP 128/78 (BP 1 Location: Right arm, BP Patient Position: Supine)   ??? Pulse 69   ??? Temp 98.1 ??F (36.7 ??C)   ??? Resp 18   ??? Ht 5\' 8"  (1.727 m)    ??? Wt 91.8 kg (202 lb 6.1 oz)   ??? SpO2 99%   ??? BMI 30.77 kg/m2     Last 3 Recorded Weights in this Encounter    07/05/17 0332 07/05/17 0628 07/06/17 0342   Weight: 90.7 kg (200 lb) 91 kg (200 lb 9.9 oz) 91.8 kg (202 lb 6.1 oz)      Body mass index is 30.77 kg/(m^2).     Intake/Output Summary (Last 24 hours) at 07/06/17 1218  Last data filed at 07/06/17 0900   Gross per 24 hour   Intake              500 ml   Output                0 ml   Net              500 ml     TELE personally reviewed by me: SR    EXAM:  General:  Skin warm, perfusion adequate  Neck: JVD is absent  Lungs: clear, no rales, no rhonchi   Cardiac:  Regular Rhythm, No murmur, Gallops or Rubs  Abdomen: soft, nl bowel sounds  Ext: Edema is absent  Neuro:  Alert and oriented; Nonfocal    Labs:  Basic Metabolic Profile   Lab Results   Component Value Date/Time    NA 141 07/05/2017 03:50 AM    NA 140 02/05/2017 11:55 AM    NA 141 07/16/2014 04:03 AM    K 3.8 07/05/2017 03:50 AM    K 4.0 02/05/2017 11:55 AM    K 4.3 07/16/2014 04:03 AM    CL 107 07/05/2017 03:50 AM    CL 107 02/05/2017 11:55 AM    CL 109 (H) 07/16/2014 04:03 AM    CO2 26 07/05/2017 03:50 AM    CO2 28 02/05/2017 11:55 AM    CO2 27 07/16/2014 04:03 AM    BUN 15 07/05/2017 03:50 AM    BUN 14 02/05/2017 11:55 AM    BUN 15 07/16/2014 04:03 AM    CREA 0.8 07/05/2017 03:50 AM    CREA 0.7 02/05/2017 11:55 AM    CREA 0.8 07/16/2014 04:03 AM    GLU 123 (H) 07/05/2017 03:50 AM    GLU 164 (H) 02/05/2017 11:55 AM    GLU 124 (H) 07/16/2014 04:03 AM    CA 8.7 07/05/2017 03:50 AM    CA 8.9 02/05/2017 11:55 AM    CA 8.2 (L) 07/16/2014 04:03 AM    MG 1.8 02/05/2017 11:55 AM    MG 1.6 (L) 07/14/2014 05:14 PM    PHOS 2.5 02/05/2017 11:55 AM        CBC w/Diff    Lab Results   Component Value Date/Time    WBC 6.7 07/05/2017 03:50 AM    WBC 6.7 02/05/2017 11:55 AM    WBC 5.4 07/16/2014 04:03 AM    RBC 3.79 (L) 07/05/2017 03:50 AM    RBC 3.93 02/05/2017 11:55 AM    RBC 3.48 (L) 07/16/2014 04:03 AM     HGB 11.6 (L) 07/05/2017 03:50 AM    HGB 12.2 (L) 02/05/2017 11:55 AM    HGB 11.0 (L) 07/16/2014 04:03 AM    HCT 33.7 (L) 07/05/2017 03:50 AM    HCT 35.7 (L) 02/05/2017 11:55 AM    HCT 31.8 (L) 07/16/2014 04:03 AM    MCV 88.9 07/05/2017 03:50 AM    MCV 90.8 02/05/2017 11:55 AM    MCV 91.4 07/16/2014 04:03 AM    MCH 30.6 07/05/2017 03:50 AM    MCH 31.0 02/05/2017 11:55 AM    MCH 31.6 07/16/2014 04:03 AM    MCHC 34.4 07/05/2017 03:50 AM    MCHC 34.2 02/05/2017 11:55 AM    MCHC 34.6 07/16/2014 04:03 AM    PLT 178 07/05/2017 03:50 AM    PLT 192 02/05/2017 11:55 AM    PLT 149 07/16/2014 04:03 AM    Lab Results   Component Value Date/Time    GRANS 47.8 07/05/2017 03:50 AM    GRANS 56.2 02/05/2017 11:55 AM    GRANS 37.8 07/16/2014 04:03 AM    LYMPH 37.4 07/05/2017 03:50 AM    LYMPH 33.0 02/05/2017 11:55 AM    LYMPH 43.3 07/16/2014 04:03 AM    MONOS 9.3 07/05/2017 03:50 AM    MONOS 8.5 02/05/2017 11:55 AM    MONOS 10.9 07/16/2014 04:03 AM    EOS 4.5 07/05/2017 03:50 AM    EOS 1.3 02/05/2017 11:55 AM    EOS 6.7 (H) 07/16/2014 04:03 AM    BASOS 0.6 07/05/2017 03:50 AM    BASOS 0.6 02/05/2017 11:55 AM    BASOS 0.9 07/16/2014 04:03 AM  Cardiac Enzymes   Lab Results   Component Value Date/Time    CPK 58 07/16/2014 12:36 PM    CPK 54 07/16/2014 04:03 AM    CPK 56 07/15/2014 04:14 PM    CKMB 1.3 07/16/2014 12:36 PM    CKMB 0.9 07/16/2014 04:03 AM    CKMB 0.9 07/15/2014 04:14 PM        Coagulation   No results found for: PTP, INR, APTT       Maurice Small, MD  [facility] Faylene Million)  July 06, 2017  12:18 PM  Pager: [phone]

## 2017-07-06 NOTE — Other (Signed)
Patient having no complaints of pain. No nausea or vomiting. Ambulating in the halls and to the bathroom. Daughters here to take home. IV removed, no medications, follow up appointments explained with time, date, address, and phone numbers. Patient has all belongings and any questions were answered.

## 2017-07-06 NOTE — Progress Notes (Signed)
Problem: Falls - Risk of  Goal: *Absence of Falls  Document Schmid Fall Risk and appropriate interventions in the flowsheet.   Outcome: Progressing Towards Goal  Fall Risk Interventions:            Medication Interventions: Patient to call before getting OOB, Teach patient to arise slowly

## 2017-07-06 NOTE — Progress Notes (Signed)
Discharge Plan: Home with family assistance and physician follow up    Discharge Date: 07/06/2017     Outpatient Therapy Script Given: No    Indigent Medications Filled: NA    TCC Referral: No    DME needed and ordered for Discharge: None    Transportation: Family    The plan of care has been discussed with the patient

## 2017-07-06 NOTE — Other (Addendum)
----------  DocumentID: [id number]------------------------------------------------              [facility]                       Patient Education Report         Name: [name], [name]                  Date: 07/05/2017    MRN: [id number]                    Time: 6:27:04 AM         Patient ordered video: 'Patient Safety: Stay Safe While you are in the Hospital'    from [id number] via phone number: [phone] at 6:27:04 AM    Description: This program outlines some of the precautions patients can take to ensure a speedy recovery without extra complications. The video emphasizes the importance of communicating with the healthcare team.    ----------DocumentID: [id number]------------------------------------------------                       [facility]          Patient Education Report - Discharge Summary        Date: 07/06/2017   Time: 1:30:15 PM   Name: [name], [name]   MRN: [id number]      Account Number: [id number]      Education History:        Patient ordered video: 'Patient Safety: Stay Safe While you are in the Hospital' from [id number] on 07/05/2017 06:27:04 AM

## 2017-07-06 NOTE — Other (Signed)
Bedside and Verbal shift change report given to [employer] developer) by Watt Climes (offgoing nurse). Report included the following information SBAR, Kardex and MAR.

## 2017-07-08 LAB — EKG 12-LEAD
Atrial Rate: 144 {beats}/min
Q-T Interval: 360 ms
QRS Duration: 94 ms
QTc Calculation (Bazett): 469 ms
R Axis: 56 degrees
T Axis: 45 degrees
Ventricular Rate: 102 {beats}/min

## 2017-07-08 LAB — EKG, 12 LEAD, INITIAL
Atrial Rate: 144 {beats}/min
Calculated R Axis: 56 degrees
Calculated T Axis: 45 degrees
Q-T Interval: 360 ms
QRS Duration: 94 ms
QTC Calculation (Bezet): 469 ms
Ventricular Rate: 102 {beats}/min

## 2017-07-31 NOTE — Progress Notes (Signed)
Patient Name: [name] ([id number]) CCM Ordered On: 11/15/2016   DOB: [birth year] (71 year old) Verbal Consent Obtained On: 04/24/2017   Phone: [phone] Date of Service: 07/31/2017   Address: [address]    Chronic Care Management     Current Medications   ? imipramine 50 mg tablet - Dispense: Tablet; TABLET ORAL  ? lisinopril 5 mg tablet - Dispense: Tablet; TABLET ORAL  ? losartan 50 mg tablet - Dispense: Tablet; TABLET ORAL  ? metformin 1,000 mg tablet - Dispense: Tablet; TABLET ORAL  ? metoprolol succinate ER 25 mg tablet,extended release 24 hr - Dispense: Tablet; Tablet Sustained Release 24 hr ORAL  ? pravastatin 10 mg tablet - Dispense: Tablet; TABLET ORAL  ? Xarelto 20 mg tablet - Dispense: Tablet; TABLET ORAL  ? zolpidem 10 mg tablet - Dispense: Tablet; TABLET ORAL  Condition Management Care Plan  The physician has established and agreed upon a plan of care with the patient as outlined below. During the initial call on 04/24/2017, a medical assistant obtained consent from the patient to receive CCM services and the patient was advised to call our office if they ever wish to opt out of or revoke CCM services. The medical assistant reviewed the Care Plan with the patient and provided an updated status on each element of the Care Plan.   Monthly Health Questions:   Chief Complaint: Prostate Cancer  Hypertension    Call Date: 07/31/2017. Call completed by Azell Der. Verbal consent to participate in the Chronic Care Management program was obtained prior to the call.  Start Time: 3:10 P.M. End Time: 3:30 P.M.   Time spent on the phone with patient: 6 minutes  Time spent on care coordination: 6 minutes  Time spent on documentation: 8 minutes    What is your email address? n/a   What is your email address? n/a   Can you receive text messages? Yes   Any recent insurance changes? No. verified on 07/31/2017   Do you have an emergency contact or caregiver? Yes Daughter    If yes, what is their name and phone number in case of an emergency? [name] reported since we last spoke he has been feeling okay physically and mentally. [name] stated that he saw his cardiologist since we last spoke for his A-Fib spells he has been having. His cardiologist sent him for a Stress Test to figure out what is going on. [name] reported he goes to a different cardiologist at the end of this month to see what they're going to do. [name] reported he hasn't had any new concerns or changes to his urinary system. He stated that he has been waking up still a few times a night to urinate, but hasn't had any pain or changes in the color/smell this month. [name] reported no additional concerns or symptoms at this time.  Call: 6 minutes  Updated meds/uploaded call/sent next CCM reminder: 6 minutes  Charted/Scheduled next CCM: 8 minutes.    If we are ever unable to reach you for your monthly check-in, is it okay to send you information on care coordination? Yes.    If we are ever unable to reach you for your monthly check-in, is it okay to send you information on care coordination? No :not at this current time.  [name] stated he has been feeling okay this month.   No numerical value given.    Does not have pain today  Patient states he has been doing okay.  No numerical value given.  Patient runs around a lot throughout the day. Takes his grandkids to appts, school events.  No numerical value given.    Has not had any new symptoms since we spoke   Previous symptoms have not worsened   Has not seen any doctor(s) since last spoke   Has not been to the ER since last spoke   Has not been hospitalized since last spoke  Current weight is 200. No changes in appetite    Has not had any fevers this past month   Has NOT fallen since last time we spoke   Has NOT had loss of balance or dizziness since we last spoke   Has NOT had new skin lesions noticed   Sleeping poorly :patient is awake most of the night. patient states he gets up about 2 times to use the bathroom.  Has been having regular bowel movements  Has not had any changes in medication    All medications currently filled   Taking medications as prescribed   Not having any problems or side effects from medications   Is following the care plan recommended by your doctors   Does NOT have an MD apt scheduled this month   Does NOT have any questions listed to ask your doctor at your next appointment   Not following recommended diet pt tries to eat healthy   Does not smoke   Last colonoscopy date: couple years ago   Gotten a flu or pneumonia shot this year.   Do you know when your last vision screening was? Yes 2-3 months ago   Do you currently need any coupons for any of your medications? No   Do you need any diet specific recipes (i.e. kidney, renal, low carb diet)? No   Do you currently use any medical supplies? No   Cancer of the Prostate:   Care Plan Review:   o No weak or slow urinary stream   o No feeling of incomplete bladder emptying   o No urinary stream that starts and stops   o No difficulty starting urination   o Frequent or urgent urination? Yes - Better than last month :occ. when he knows he needs to go he goes   o Getting up frequently at night to urinate? Yes - Same as last month :2x a night   o No continued dribbling of urine   o Course of treatment for Prostate Cancer: Prostate Cancer: 1 year ago he had surgery and had his Prostate removed.  [name] reported he hasn't had any new concerning urinary symptoms this month. He stated that he is still waking up about 2x a night, but hasn't noticed any changes to the color or the smell of his urine.  Hypertension:   Care Plan Review:   o Doctor recommended regular check of BP  o Following a low salt diet pt states he tries to eat healthy.  Symptom Review:   ? No symptoms this month.   Brode reported he has checked his BP and it has been running WNL. He  stated that he hasn't had any symptoms this month and goes back to his cardiologist at the end of the month to see what they can do about his A-Fib.  A copy of this updated Care Plan was made available to Darius [place] Given to ensure ongoing monitoring by the physician.   A copy was also made available to the patient and their care team via the Patient Portal.  Diagnosis   ? C61: Malignant neoplasm of prostate  ? I10: Essential (primary) hypertension  Procedures   ? 02774: Chronic care management services, at least 20 minutes of clinical staff time directed by a physician or other qualified health care professional, per calendar month.  The above conditions were addressed with the patient today.  The combination and progression of these chronic conditions have the ability to negatively impact the patient's overall quality of life or place the patient at risk of acute exacerbation or functional decline.      ~~This report was copied and pasted from the servicing EHR system.~~

## 2017-08-28 NOTE — Progress Notes (Signed)
Patient Name: [name] ([id number]) CCM Ordered On: 11/15/2016   DOB: August 20, 1946 (71 year old) Verbal Consent Obtained On: 04/24/2017   Phone: [phone] Date of Service: 08/28/2017   Address: [address]    Chronic Care Management     Current Medications   ? flecainide 50 mg tablet - Dispense: Tablet; TABLET ORAL  ? imipramine 50 mg tablet - Dispense: Tablet; TABLET ORAL  ? lisinopril 5 mg tablet - Dispense: Tablet; TABLET ORAL  ? losartan 50 mg tablet - Dispense: Tablet; TABLET ORAL  ? metformin 1,000 mg tablet - Dispense: Tablet; TABLET ORAL  ? metoprolol succinate ER 25 mg tablet,extended release 24 hr - Dispense: Tablet; Tablet Sustained Release 24 hr ORAL  ? pravastatin 10 mg tablet - Dispense: Tablet; TABLET ORAL  ? Xarelto 20 mg tablet - Dispense: Tablet; TABLET ORAL  ? zolpidem 10 mg tablet - Dispense: Tablet; TABLET ORAL  Condition Management Care Plan  The physician has established and agreed upon a plan of care with the patient as outlined below. During the initial call on 04/24/2017, a medical assistant obtained consent from the patient to receive CCM services and the patient was advised to call our office if they ever wish to opt out of or revoke CCM services. The medical assistant reviewed the Care Plan with the patient and provided an updated status on each element of the Care Plan.   Monthly Health Questions:   Chief Complaint: Prostate Cancer  Hypertension    Call Date: 08/28/2017. Call completed by [name]. Verbal consent to participate in the Chronic Care Management program was obtained prior to the call.  Start Time: 3:31 P.M. End Time: 3:51 P.M.   Time spent on the phone with patient: 5 minutes  Time spent on care coordination: 5 minutes  Time spent on documentation: 10 minutes    What is your email address? n/a   What is your email address? n/a   Can you receive text messages? Yes   Any recent insurance changes? No. verified on 08/28/2017    Do you have an emergency contact or caregiver? Yes Daughter   If yes, what is their name and phone number in case of an emergency? [name] reported overall no changes this month.   He stated that he saw his cardiologist who put him on flecainide 50mg  tablet to help with his A-Fib. [name] reported he hasn't had any spells of A-Fib this month. He reported no urinary changes to the color/smell of his urine this month and no pain. [name] reported mentally and physically he has been doing good this month with no main concerns.   Call: 5 minutes  Updated meds/uploaded call: 5 minutes  Charted/Scheduled next CCM: 10 minutes.    If we are ever unable to reach you for your monthly check-in, is it okay to send you information on care coordination? Yes.    If we are ever unable to reach you for your monthly check-in, is it okay to send you information on care coordination? No :not at this current time.  Patient stated he has been feeling okay this month.   No numerical value given.    Does not have pain today  Patient states he has been doing okay.  No numerical value given.  Patient runs around a lot throughout the day. Takes his grandkids to appts, school events.  No numerical value given.    Has not had any new symptoms since we spoke   Previous  symptoms have not worsened   Has seen doctor(s) since last spoke :Cardiologist   Has not been to the ER since last spoke   Has not been hospitalized since last spoke  Current weight is 200. No changes in appetite    Has not had any fevers this past month   Has NOT fallen since last time we spoke   Has NOT had loss of balance or dizziness since we last spoke   Has NOT had new skin lesions noticed  Sleeping poorly :patient is awake most of the night. patient states he gets up about 2 times to use the bathroom.  Has been having regular bowel movements  Has had changes in medication   o Started on flecainide 50mg     All medications currently filled    Taking medications as prescribed   Not having any problems or side effects from medications   Is following the care plan recommended by your doctors   Does NOT have an MD apt scheduled this month   Does NOT have any questions listed to ask your doctor at your next appointment   Not following recommended diet pt tries to eat healthy   Does not smoke   Last colonoscopy date: couple years ago   Gotten a flu or pneumonia shot this year.   Do you know when your last vision screening was? Yes 2-3 months ago   Do you currently need any coupons for any of your medications? No   Do you need any diet specific recipes (i.e. kidney, renal, low carb diet)? No   Do you currently use any medical supplies? No   Does insurance pay for those supplies? No   Would you like assistance finding cheaper supplies? No   Cancer of the Prostate:   Care Plan Review:   o No weak or slow urinary stream   o No feeling of incomplete bladder emptying   o No urinary stream that starts and stops   o No difficulty starting urination   o Frequent or urgent urination? Yes - Better than last month :occ. when he knows he needs to go he goes   o Getting up frequently at night to urinate? Yes - Same as last month :2x a night   o No continued dribbling of urine   o Course of treatment for Prostate Cancer: Prostate Cancer: 1 year ago he had surgery and had his Prostate removed.  No changes this month.  Hypertension:   Care Plan Review:   o Doctor recommended regular check of BP  o Following a low salt diet pt states he tries to eat healthy.  Symptom Review:   ? No symptoms this month.   BP has been WNL.   Patient saw his cardiologist this month since we spoke last and he put him on a new medicine flecainide 50mg  to help with his A-Fib  A copy of this updated Care Plan was made available to [name] Given to ensure ongoing monitoring by the physician.   A copy was also made available to the patient and their care team via the Patient Portal.  Diagnosis    ? 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 C61: Malignant neoplasm of prostate  ? I10: Essential (primary) hypertension  Procedures   ? 16010: Chronic care management services, at least 20 minutes of clinical staff time directed by a physician or other qualified health care professional, per calendar month.  The above conditions were addressed with  the patient today.  The combination and progression of these chronic conditions have the ability to negatively impact the patient's overall quality of life or place the patient at risk of acute exacerbation or functional decline.        ~~This report was copied and pasted from the servicing EHR system.~~

## 2017-09-26 NOTE — Progress Notes (Signed)
Patient Name: [name] ([id number]) CCM Ordered On: 11/15/2016   DOB: 1946-06-17 (71 year old) Verbal Consent Obtained On: 04/24/2017   Phone: [phone] Date of Service: 09/26/2017   Address: [address]     Chronic Care Management     Current Medications   flecainide 50 mg tablet - Dispense: Tablet; TABLET ORAL   imipramine 50 mg tablet - Dispense: Tablet; TABLET ORAL   lisinopril 5 mg tablet - Dispense: Tablet; TABLET ORAL   losartan 50 mg tablet - Dispense: Tablet; TABLET ORAL   metformin 1,000 mg tablet - Dispense: Tablet; TABLET ORAL   metoprolol succinate ER 25 mg tablet,extended release 24 hr - Dispense: Tablet; Tablet Sustained Release 24 hr ORAL   Xarelto 20 mg tablet - Dispense: Tablet; TABLET ORAL   zolpidem 10 mg tablet - Dispense: Tablet; TABLET ORAL  Condition Management Care Plan  The physician has established and agreed upon a plan of care with the patient as outlined below. During the initial call on 04/24/2017, a medical assistant obtained consent from the patient to receive CCM services and the patient was advised to call our office if they ever wish to opt out of or revoke CCM services. The medical assistant reviewed the Care Plan with the patient and provided an updated status on each element of the Care Plan.   Monthly Health Questions:   Chief Complaint: Prostate Cancer  Hypertension    Call Date: 09/26/2017. Call completed by [name]. Verbal consent to participate in the Chronic Care Management program was obtained prior to the call.  Start Time: 3:00 P.M. End Time: 3:20 P.M.   Time spent on the phone with patient: 5 minutes  Time spent on care coordination: 5 minutes  Time spent on documentation: 10 minutes    What is your email address? n/a   What is your email address? n/a   Can you receive text messages? Yes   Any recent insurance changes? No. verified on 09/26/2017   Do you have an emergency contact or caregiver? Yes Daughter    If yes, what is their name and phone number in case of an emergency? [name] reported overall he has been doing okay physically and mentally this month. He stated that he saw his doctor recently for a sick visit. [name] stated that he has been having a cough the last few weeks and he saw his doctor who gave him an ATB to take for three days, which he finished.   [name] stated that he is still taking some OTC robitussin.  [name] reported no major changes to his urological symptoms this month.  [name] stated that his BP has been WNL with no main concerns/symptoms. He reported no other overall questions.  Call- 5 mins  Updated meds- 5 mins  Charted- 10 mins    If we are ever unable to reach you for your monthly check-in, is it okay to send you information on care coordination? Yes.    If we are ever unable to reach you for your monthly check-in, is it okay to send you information on care coordination? No :not at this current time.  [name] stated he has been feeling okay this month, no changes.  No numerical value [name].    Does not have pain today  Patient states he has been doing okay, no changes.  No numerical value [name].  Patient runs around a lot throughout the day. Takes his grandkids to appts, school events.  No numerical value  [name].    Has had new symptoms since we spoke :Cough   Previous symptoms have not worsened   Has seen doctor(s) since last spoke :PCP   Has not been to the ER since last spoke   Has not been hospitalized since last spoke  Current weight is 200. No changes in appetite    Has not had any fevers this past month   Has NOT fallen since last time we spoke   Has NOT had loss of balance or dizziness since we last spoke   Has NOT had new skin lesions noticed  Sleeping poorly :patient is awake most of the night. patient states he gets up about 2 times to use the bathroom.  Has been having regular bowel movements  Has had changes in medication   Stopped on pravastatin     All medications currently filled   Taking medications as prescribed   All over the counter medications taken this month: robitussin   Not having any problems or side effects from medications   Is following the care plan recommended by your doctors   Does NOT have an MD apt scheduled this month none upcoming until 2019   Does NOT have any questions listed to ask your doctor at your next appointment   Not following recommended diet pt tries to eat healthy   Does not smoke   Last colonoscopy date: couple years ago   Gotten a flu or pneumonia shot this year.   Do you know when your last vision screening was? Yes 2-3 months ago   Do you currently need any coupons for any of your medications? No   Do you need any diet specific recipes (i.e. kidney, renal, low carb diet)? No   Do you currently use any medical supplies? No   Does insurance pay for those supplies? No   Would you like assistance finding cheaper supplies? No   Cancer of the Prostate:   Care Plan Review:   No weak or slow urinary stream   No feeling of incomplete bladder emptying   No urinary stream that starts and stops   No difficulty starting urination   Frequent or urgent urination? Yes - Better than last month :occ. when he knows he needs to go he goes   Getting up frequently at night to urinate? Yes - Same as last month :2x a night   No continued dribbling of urine   Course of treatment for Prostate Cancer: Prostate Cancer: 1 year ago he had surgery and had his Prostate removed.  No changes this month.  Hypertension:   Care Plan Review:   Doctor recommended regular check of BP  Following a low salt diet pt states he tries to eat healthy.  Symptom Review:   No symptoms this month.   BP has been WNL.   He reported no main symptoms/changes this month.  A copy of this updated Care Plan was made available to [name] to ensure ongoing monitoring by the physician.   A copy was also made available to the patient and their care team via the Patient Portal.   Diagnosis   C61: Malignant neoplasm of prostate   I10: Essential (primary) hypertension  Procedures   99490: Chronic care management services, at least 20 minutes of clinical staff time directed by a physician or other qualified health care professional, per calendar month.  The above conditions were addressed with the patient today. The combination and progression of these chronic conditions have the  ability to negatively impact the patient's overall quality of life or place the patient at risk of acute exacerbation or functional decline.     ~~This report was copied and pasted from the servicing EHR system.~~

## 2017-10-12 ENCOUNTER — Encounter: Attending: Urology | Primary: Internal Medicine

## 2017-10-24 NOTE — Progress Notes (Signed)
Patient Name: [name] ([id number]) CCM Ordered On: 11/15/2016   DOB: [birth year] (72 year old) Verbal Consent Obtained On: 04/24/2017   Phone: [phone] Date of Service: 10/24/2017   Address: [address]     Chronic Care Management     Problem List   1. 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 Malignant neoplasm of prostate  Current Medications   flecainide 50 mg tablet - Dispense: Tablet; TABLET ORAL   imipramine 50 mg tablet - Dispense: Tablet; TABLET ORAL   lisinopril 5 mg tablet - Dispense: Tablet; TABLET ORAL   losartan 50 mg tablet - Dispense: Tablet; TABLET ORAL   metformin 1,000 mg tablet - Dispense: Tablet; TABLET ORAL   metoprolol succinate ER 25 mg tablet,extended release 24 hr - Dispense: Tablet; Tablet Sustained Release 24 hr ORAL   Xarelto 20 mg tablet - Dispense: Tablet; TABLET ORAL   zolpidem 10 mg tablet - Dispense: Tablet; TABLET ORAL  Condition Management Care Plan  The physician has established and agreed upon a plan of care with the patient as outlined below. During the initial call on 04/24/2017, a medical assistant obtained consent from the patient to receive CCM services and the patient was advised to call our office if they ever wish to opt out of or revoke CCM services. The medical assistant reviewed the Care Plan with the patient and provided an updated status on each element of the Care Plan.   Monthly Health Questions:   Chief Complaint: Prostate Cancer  Nocturia   Hypertension    Call Date: 10/24/2017. Call completed by [name]. Verbal consent to participate in the Chronic Care Management program was obtained prior to the call.  Start Time: 3:07 P.M. End Time: 3:27 P.M.   Time spent on the phone with patient: 5 minutes  Time spent on care coordination: 5 minutes  Time spent on documentation: 10 minutes    What is your email address? n/a   What is your email address? n/a   Can you receive text messages? Yes    Any recent insurance changes? No. verified on 10/24/2017   Do you have an emergency contact or caregiver? Yes Daughter   If yes, what is their name and phone number in case of an emergency? [name] states that he has been doing alright. [name] states that things have been going well since we spoke with him last. He has had a little more leakage through out the day, most of the time this will before he urinates. This happens prior to having the feeling to urinate. He does still get up on average twice a night urinate. He states that the strength of his urinary stream has been a little weaker but has remained manageable for him. He does not have any additional symptoms or concerns to report at this time.     Care coordination: monthly ccm scheduled/ phone call uploaded    If we are ever unable to reach you for your monthly check-in, is it okay to send you information on care coordination? Yes.    If we are ever unable to reach you for your monthly check-in, is it okay to send you information on care coordination? No :not at this current time.  [name] stated he has been feeling okay this month, no changes.  No numerical value given.    Does not have pain today  Patient states he has been doing okay, no changes.  No numerical value given.  Patient  runs around a lot throughout the day. Takes his grandkids to appts, school events.  No numerical value given.    Has not had any new symptoms since we spoke   Previous symptoms worsened :urinary incontince   Has not seen any doctor(s) since last spoke   Has not been to the ER since last spoke   Has not been hospitalized since last spoke  Current weight is 200. No changes in appetite    Has not had any fevers this past month   Has NOT fallen since last time we spoke   Has NOT had loss of balance or dizziness since we last spoke   Has NOT had new skin lesions noticed  Sleeping poorly :patient is awake most of the night. patient states he  gets up about 2 times to use the bathroom.  Has been having regular bowel movements  Has not had any changes in medication    All medications currently filled   Taking medications as prescribed   All over the counter medications taken this month: robitussin   Not having any problems or side effects from medications   Is following the care plan recommended by your doctors   Does have an MD apt scheduled this month [name] 1/17   Does NOT have any questions listed to ask your doctor at your next appointment   Not following recommended diet pt tries to eat healthy   Does not smoke   Last colonoscopy date: couple years ago   Gotten a flu or pneumonia shot this year.   Do you know when your last vision screening was? Yes 2-3 months ago   Do you currently need any coupons for any of your medications? No   Do you need any diet specific recipes (i.e. kidney, renal, low carb diet)? No   Do you currently use any medical supplies? No   Does insurance pay for those supplies? No   Would you like assistance finding cheaper supplies? No   Cancer of the Prostate:   Care Plan Review:   Weak or slow urinary stream? Yes - Same as last month :barely weak he states   No feeling of incomplete bladder emptying   No urinary stream that starts and stops   No difficulty starting urination   Frequent or urgent urination? Yes - Better than last month :occ. when he knows he needs to go he goes   Getting up frequently at night to urinate? Yes - Same as last month :2x a night   No continued dribbling of urine   Course of treatment for Prostate Cancer: Prostate Cancer: 1 year ago he had surgery and had his Prostate removed.  He has had a little more leakage through out the day, most of the time this will before he urinates. This happens prior to having the feeling to [place].  Hypertension:   Care Plan Review:   Doctor recommended regular check of BP  Following a low salt diet pt states he tries to eat healthy.  Symptom Review:    No symptoms this month.   BP has been WNL.   He reported no main symptoms/changes this month.  Nocturia:   Care Plan Review:   How many times a night do you wake to urinate? 2   No bedwetting   No decrease in urine production   No personal care products/depends/pads used during the night  Comments: No additional symptoms or concerns to report at this time.  A copy of  this updated Care Plan was made available to Herbie Freeport Given to ensure ongoing monitoring by the physician.   A copy was also made available to the patient and their care team via the Patient Portal.  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  The above conditions were addressed with the patient today. The combination and progression of these chronic conditions have the ability to negatively impact the patient's overall quality of life or place the patient at risk of acute exacerbation or functional decline      ~~This report was copied and pasted from the servicing EHR system.~~

## 2017-10-26 ENCOUNTER — Ambulatory Visit: Admit: 2017-10-26 | Discharge: 2017-10-26 | Attending: Physician Assistant | Primary: Internal Medicine

## 2017-10-26 DIAGNOSIS — C61 Malignant neoplasm of prostate: Secondary | ICD-10-CM

## 2017-10-26 LAB — TESTOSTERONE: Testosterone: 166 ng/dL — ABNORMAL LOW (ref 348–1197)

## 2017-10-26 LAB — PROSTATE SPECIFIC ANTIGEN, TOTAL (PSA): Prostate Specific Ag: <0.03 ng/mL (ref 0–4)

## 2017-10-26 NOTE — Progress Notes (Signed)
[name]  1946/09/03    10/26/2017    ASSESSMENT:  Encounter Diagnoses     ICD-10-CM ICD-9-CM   1. Prostate cancer ([place]) C61 185   2. Stress incontinence, male N39.3 788.32     1.  [id number] gleason 3+4 prostate cancer s/p RALP 05/04/2016   -Most recent PSA <0.03 on 01/02/17. PSA today  pending    Low T- 166       PLAN:  PSA drawn today <0.03  Discussed possibly starting TRT in the future.   Pt is moving to [place] with his daughter this summer  F/u in 6 months with PSA and T prior with Dr Given    Patient's BMI is out of the normal parameters.  Information about BMI was given and patient was advised to follow-up with their PCP for further management.            Chief Complaint   Patient presents with   ??? Prostate Cancer       HISTORY OF PRESENT ILLNESS:   [name] is a 72 y.o. male who presents today s/p   DVP w/ RPLND on 05/04/2016 for Adenocarcinoma of the Prostate, s/p MRI fusion bx 01/20/2016, pathology gleason 4+3, 7/19 total positive cores, presenting PSA 9.04ng/mL. Bone scan 03/07/2016 revealed no evidence of osseous metastatic disease.   PSA drawn today is <0.03, T 166.    Patient reports no voiding complaints. He endorses intermittent UUI. He admits to drinking moderate amount of caffeine.. No pad usage. Denies hematuria, dysuria, urgency, frequency, and incomplete bladder emptying. Currently asymptomatic for infection. Denies bone or back pain. Good appetite and stable weight    He reports his energy level is low, low libido, and chronic fatigue. Most recent Testosterone was 166    Erections:  not currently sexual active as he is not in a relationship    Family history of prostate cancer in father.     PMH:  Afib on Xarelto. Diabetes. 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          Past Medical History:   Diagnosis Date   ??? A-fib ([place])    ??? Benign prostatic hyperplasia with lower urinary tract symptoms    ??? BPH (benign prostatic hypertrophy) with urinary obstruction     ??? Diabetes mellitus ([place])    ??? Elevated prostate specific antigen (PSA)    ??? Enlarged prostate with lower urinary tract symptoms (LUTS)    ??? History of needle biopsy of prostate with negative result    ??? History of urinary retention    ??? Hypercholesteremia    ??? Hypertrophy of prostate with urinary obstruction and other lower urinary tract symptoms (LUTS)    ??? Prostate cancer ([place])      stage T1c Gleason 4+3 (7) adenocarcinoma of the prostate in 7/19 cores involving 30-50%, s/p MRI fusion prostate biopsy 01/20/16   ??? Urinary retention    ??? UTI (lower urinary tract infection)      Past Surgical History:   Procedure Laterality Date   ??? HX CYST REMOVAL     ??? HX PROSTATECTOMY  05/04/2016    DVP Dr Given   ??? HX UROLOGICAL  01/20/2016    PNBx-TRUS Vol 70cc's, Gleason,4+3 occupying core and 50% of the Bx  material, 3+4 present in cores and occupying 30% of the Bx specimen, 4+3 present in cores and occupying 40% of the Bx specimen, 3+4 presen tin core and occupying 35% of the Bx  material, Dr Wynona Luna      Family History   Problem Relation Age of Onset   ??? Diabetes Mother    ??? Hypertension Mother    ??? Diabetes Sister    ??? Hypertension Sister    ??? Stroke Sister      Social History     Socioeconomic History   ??? Marital status: DIVORCED     Spouse name: Not on file   ??? Number of children: Not on file   ??? Years of education: Not on file   ??? Highest education level: Not on file   Social Needs   ??? Financial resource strain: Not on file   ??? Food insecurity - worry: Not on file   ??? Food insecurity - inability: Not on file   ??? Transportation needs - medical: Not on file   ??? Transportation needs - non-medical: Not on file   Occupational History   ??? Not on file   Tobacco Use   ??? Smoking status: Never Smoker   ??? Smokeless tobacco: Never Used   Substance and Sexual Activity   ??? Alcohol use: No   ??? Drug use: No   ??? Sexual activity: Not on file   Other Topics Concern   ??? Not on file   Social History Narrative   ??? Not on file      No Known Allergies  Current Outpatient Medications   Medication Sig Dispense Refill   ??? acetaminophen (TYLENOL) 325 mg tablet Take 650 mg by mouth.     ??? raNITIdine (ZANTAC) 150 mg tablet 150 mg.     ??? zolpidem (AMBIEN) 10 mg tablet 1/2-1 PO daily     ??? lisinopril (PRINIVIL, ZESTRIL) 5 mg tablet Take 5 mg by mouth daily. Indications: hypertension     ??? metFORMIN (GLUCOPHAGE) 1,000 mg tablet 1,000 mg two (2) times daily (with meals).     ??? ACCU-CHEK AVIVA PLUS TEST STRP strip      ??? metoprolol (LOPRESSOR) 25 mg tablet 25 mg daily.     ??? XARELTO 20 mg tab tablet 20 mg daily (with dinner).         Review of Systems  Constitutional: Fever: No  Skin: Rash: No  HEENT: Hearing difficulty: No  Eyes: Blurred vision: No  Cardiovascular: Chest pain: No  Respiratory: Shortness of breath: No  Gastrointestinal: Nausea/vomiting: No  Musculoskeletal: Back pain: No  Neurological: Weakness: No  Psychological: Memory loss: No  Comments/additional findings:     Physical Examination:  Visit Vitals  BP 122/72   Ht 5\' 8"  (1.727 m)   Wt 202 lb (91.6 kg)   BMI 30.71 kg/m??     Constitutional: WDWN, Pleasant and appropriate affect, No acute distress.    CV: no lower leg swelling  Resp: normal respiratory effort  Musc:normal gait and station  Skin: no jaundice        Results for orders placed or performed in visit on 10/26/17   PROSTATE SPECIFIC ANTIGEN, TOTAL (PSA)   Result Value Ref Range    Prostate Specific Ag <0.03  0 - 4 ng/mL   TESTOSTERONE   Result Value Ref Range    Testosterone 166 (L) 348 - 1,197 ng/dL        Thresa Ross, PA      [id number], Kingsley Spittle, MD

## 2017-10-26 NOTE — Progress Notes (Signed)
[name] presents today for lab draw per Wynn Banker, PA's order.   Dr. Rozelle Logan  was present in the clinic as incident to.     PSA and TST obtained via venipuncture without any difficulty.    Patient will be notified with lab results.       Orders Placed This Encounter   ??? PROSTATE SPECIFIC ANTIGEN, TOTAL (PSA)   ??? TESTOSTERONE   ??? COLLECTION VENOUS BLOOD,VENIPUNCTURE       Marlin Canary

## 2017-11-20 ENCOUNTER — Inpatient Hospital Stay: Admit: 2017-11-20 | Payer: MEDICARE | Primary: Internal Medicine

## 2017-11-20 DIAGNOSIS — E119 Type 2 diabetes mellitus without complications: Secondary | ICD-10-CM

## 2017-11-20 LAB — CBC W/O DIFF
HCT: 34.3 % — ABNORMAL LOW (ref 37.0–50.0)
HGB: 12.1 gm/dl — ABNORMAL LOW (ref 12.4–17.2)
MCH: 32.1 pg (ref 23.0–34.6)
MCHC: 35.3 gm/dl (ref 30.0–36.0)
MCV: 91 fL (ref 80.0–98.0)
MPV: 10.8 fL — ABNORMAL HIGH (ref 6.0–10.0)
PLATELET: 200 10*3/uL (ref 140–450)
RBC: 3.77 M/uL — ABNORMAL LOW (ref 3.80–5.70)
RDW-SD: 41.6 (ref 35.1–43.9)
WBC: 6.8 10*3/uL (ref 4.0–11.0)

## 2017-11-21 LAB — HEMOGLOBIN A1C W/O EAG: Hemoglobin A1c: 6.9 % — ABNORMAL HIGH (ref 4.2–6.3)

## 2017-11-28 NOTE — Progress Notes (Signed)
Patient Name: [name] ([id number]) CCM Ordered On: 11/15/2016   DOB: 1946/06/15 (72 year old) Verbal Consent Obtained On: 04/24/2017   Phone: [phone] Date of Service: 11/28/2017   Address: [address]     Chronic Care Management     Problem List   1. 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 Malignant neoplasm of prostate  Current Medications   flecainide 50 mg tablet - Dispense: Tablet; TABLET ORAL   imipramine 50 mg tablet - Dispense: Tablet; TABLET ORAL   lisinopril 5 mg tablet - Dispense: Tablet; TABLET ORAL   losartan 50 mg tablet - Dispense: Tablet; TABLET ORAL   metformin 1,000 mg tablet - Dispense: Tablet; TABLET ORAL   metoprolol succinate ER 25 mg tablet,extended release 24 hr - Dispense: Tablet; Tablet Sustained Release 24 hr ORAL   mirtazapine 30 mg tablet - Dispense: Tablet; TABLET ORAL   Xarelto 20 mg tablet - Dispense: Tablet; TABLET ORAL   zolpidem 10 mg tablet - Dispense: Tablet; TABLET ORAL  Condition Management Care Plan  The physician has established and agreed upon a plan of care with the patient as outlined below. During the initial call on 04/24/2017, a medical assistant obtained consent from the patient to receive CCM services and the patient was advised to call our office if they ever wish to opt out of or revoke CCM services. The medical assistant reviewed the Care Plan with the patient and provided an updated status on each element of the Care Plan.   Significant Changes   [name] stated he does still experience urinary leakage. He stated that it isn't getting any better, but it is something he can tolerate.   Monthly Health Questions:   Chief Complaint: Prostate Cancer  Nocturia   Hypertension    Call Date: 11/28/2017. Call completed by Azell Der. Verbal consent to participate in the Chronic Care Management program was obtained prior to the call.  Start Time: 2:37 P.M. End Time: 2:57 P.M.   Time spent on the phone with patient: 6 minutes   Time spent on care coordination: 6 minutes  Time spent on documentation: 8 minutes    What is your email address? n/a   What is your email address? n/a   Can you receive text messages? Yes   Any recent insurance changes? No. verified on 11/28/2017   Do you have an emergency contact or caregiver? Yes Daughter   If yes, what is their name and phone number in case of an emergency? [name] reported overall physically and mentally he has been feeling well. He stated that since we spoke he went to see his PCP along with Dr. Gearldine Bienenstock. He stated that everything checked out good with his check ups. [name] stated that he had his PSA level checked and it was good. [name] stated that he is still experiencing some urinary leakage. He reported no pain with urination, and no changes to the color/smell of his urine. [name] reported no upcoming appts scheduled for the rest of this month and March. July stated that hasn't had any changes in his medication list, and no ER visits/hospital stays. [name] had no other questions/concerns this month.  I spent 6 minutes talking with patient.  I spent 6 minutes updating meds.  I spent 8 minutes charting/uploading phone call.    If we are ever unable to reach you for your monthly check-in, is it okay to send you information on care coordination? Yes.    If we  are ever unable to reach you for your monthly check-in, is it okay to send you information on care coordination? No :not at this current time.  [name] stated he has been feeling okay this month, no changes.  No numerical value given.    Does not have pain today  Patient states he has been doing okay, no changes.  No numerical value given.  Patient runs around a lot throughout the day. Takes his grandkids to appts, school events.  No numerical value given.    Has had new symptoms since we spoke :urinary leakage.   Previous symptoms have not worsened   Has not seen any doctor(s) since last spoke    Has not been to the ER since last spoke   Has not been hospitalized since last spoke  Current weight is 200. No changes in appetite    Has not had any fevers this past month   Has NOT fallen since last time we spoke   Has NOT had loss of balance or dizziness since we last spoke   Has NOT had new skin lesions noticed  Sleeping poorly :patient is awake most of the night. patient states he gets up about 2 times to use the bathroom.  Has been having regular bowel movements  Has had changes in medication   Started on anxiety meds.      All medications currently filled   Taking medications as prescribed   Not having any problems or side effects from medications   Is following the care plan recommended by your doctors   Does NOT have an MD apt scheduled this month   Does NOT have any questions listed to ask your doctor at your next appointment   Not following recommended diet pt tries to eat healthy   Does not smoke   Last colonoscopy date: couple years ago   Gotten a flu or pneumonia shot this year.   Do you know when your last vision screening was? Yes 2-3 months ago   Do you currently need any coupons for any of your medications? No   Do you need any diet specific recipes (i.e. kidney, renal, low carb diet)? No   Do you currently use any medical supplies? No   Does insurance pay for those supplies? No   Would you like assistance finding cheaper supplies? No    Patient does not own a smart phone.   Not willing to receive text messages about their health   Cancer of the Prostate:   Care Plan Review:   Weak or slow urinary stream? Yes - Same as last month :barely weak he states   No feeling of incomplete bladder emptying   No urinary stream that starts and stops   No difficulty starting urination   Frequent or urgent urination? Yes - Better than last month :occ. when he knows he needs to go he goes   Getting up frequently at night to urinate? Yes - Same as last month :2x a night   No continued dribbling of urine    Has not had any weight gain.   Has not had noticed any pain/bone pain.   Has not had any rashes   Has not had any falls.   Have you had feelings of depression? Yes - Same as last month :started anxiety meds.   Has not had hot flashes.   Has not had worsening fatique.   Course of treatment for Prostate Cancer: Prostate Cancer: 1 year ago he had  surgery and had his Prostate removed.  Kacee reported he has been experiencing more urinary leakage.   He reported no changes in the color/smell of his urine, and no urinary pain.  Hypertension:   Care Plan Review:   Doctor recommended regular check of BP  Following a low salt diet pt states he tries to eat healthy.  Symptom Review:   No symptoms this month.   BP has been running around 130/80 with his pulse being good.  He reported no new symptoms/concerns.  Nocturia:   Care Plan Review:   How many times a night do you wake to urinate? 2x   No bedwetting   No decrease in urine production   No personal care products/depends/pads used during the night   Does not take medications for this condition.   Is not experiencing any side effects from these medications.   Does not miss doses of medications.  Comments: No additional symptoms or concerns to report at this time.  A copy of this updated Care Plan was made available to Herbie Crete Given to ensure ongoing monitoring by the physician.   A copy was also made available to the patient and their care team via the Patient Portal.  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  The above conditions were addressed with the patient today. The combination and progression of these chronic conditions have the ability to negatively impact the patient's overall quality of life or place the  patient at risk of acute exacerbation or functional decline.       ~~This report was copied and pasted from the servicing EHR system.~~

## 2017-12-27 NOTE — Progress Notes (Signed)
Patient Name: [name] ([id number]) CCM Ordered On: 11/15/2016   DOB: 1946/01/02 (72 year old) Verbal Consent Obtained On: 04/24/2017   Phone: [phone] Date of Service: 12/27/2017   Address: [address]    Chronic Care Management     Problem List   1. 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 Malignant neoplasm of prostate  Current Medications   ? flecainide 50 mg tablet - Dispense: Tablet; TABLET ORAL  ? imipramine 50 mg tablet - Dispense: Tablet; TABLET ORAL  ? lisinopril 5 mg tablet - Dispense: Tablet; TABLET ORAL  ? losartan 50 mg tablet - Dispense: Tablet; TABLET ORAL  ? metformin 1,000 mg tablet - Dispense: Tablet; TABLET ORAL  ? metoprolol succinate ER 25 mg tablet,extended release 24 hr - Dispense: Tablet; Tablet Sustained Release 24 hr ORAL  ? mirtazapine 30 mg tablet - Dispense: Tablet; TABLET ORAL  ? Xarelto 20 mg tablet - Dispense: Tablet; TABLET ORAL  ? zolpidem 10 mg tablet - Dispense: Tablet; TABLET ORAL  Condition Management Care Plan  The physician has established and agreed upon a plan of care with the patient as outlined below. During the initial call on 04/24/2017, a medical assistant obtained consent from the patient to receive CCM services and the patient was advised to call our office if they ever wish to opt out of or revoke CCM services. The medical assistant reviewed the Care Plan with the patient and provided an updated status on each element of the Care Plan.   Significant Changes   Pt still experiences some leakage.  He also noted waking up more during the night to [name].  He reports no urinary pain, or changes in the color/smell of his urine.   Monthly Health Questions:   Chief Complaint: Prostate Cancer  Nocturia   Hypertension    Call Date: 12/27/2017. Call completed by [name]. Verbal consent to participate in the Chronic Care Management program was obtained prior to the call.  Start Time: 2:26 P.M. End Time: 2:46 P.M.    Time spent on the phone with patient: 6 minutes  Time spent on care coordination: 6 minutes  Time spent on documentation: 8 minutes    What is your email address? n/a   What is your cell phone? n/a   Can you receive text messages? Yes   Any recent insurance changes? No. verified on 12/27/2017   Do you currently receive monthly phone calls or chronic care management services through any of your other providers? No   Do you have an emergency contact or caregiver? Yes Daughter   If yes, what is their name and phone number in case of an emergency? [name] reported overall physically/mentally he has been doing okay. He stated that since we spoke he hasn't had many changes. He did report his BP was a bit elevated yesterday, but today he checked it and it was 112/60. [name] reported he is still experiencing some urinary leakage this month. He stated that he has noticed some more waking up throughout the night to urinate. He reported no urinary pain, and no changes in the color/smell of his urine. [name] had no other changes/health symptoms this month.  I spent 6 mins talking with pt.  I spent 6 mins updating meds.  I spent 8 mins charting/uploading call.    If we are ever unable to reach you for your monthly check-in, is it okay to send you information on care coordination? Yes.  Would you  like to receive any additional information about our Partners who may be able to assist with any of your healthcare needs? Some of our partners include Nurse, adult, [employer], [employer], etc.? No :not at this current time.  [name] stated he has been feeling okay this month, no changes.  No numerical value given.    Does not have pain today, aches/pains-normal.  Patient states he has been doing okay, no changes.  No numerical value given.  Patient runs around a lot throughout the day. Takes his grandkids to appts, school events.  No numerical value given.    Has not had any new symptoms since we spoke    Previous symptoms have not worsened   Has seen doctor(s) since last spoke :PCP-(new)   Has not been to the ER since last spoke   Has not been hospitalized since last spoke  Current weight is 200. No changes in appetite    Has not had any fevers this past month   Has NOT fallen since last time we spoke   Has NOT had loss of balance or dizziness since we last spoke   Has NOT had new skin lesions noticed  Sleeping poorly :paitent wakes up a lot.  Has been having regular bowel movements  Has not had any changes in medication    All medications currently filled   Taking medications as prescribed   Not having any problems or side effects from medications   Is following the care plan recommended by your doctors   Does NOT have an MD apt scheduled this month   Does NOT have any questions listed to ask your doctor at your next appointment   Not following recommended diet pt tries to eat healthy   Does not smoke   Last colonoscopy date: couple years ago   Gotten a flu or pneumonia shot this year.   Do you know when your last vision screening was? Yes 2-3 months ago   Do you currently need any coupons for any of your medications? No   Do you need any diet specific recipes (i.e. kidney, renal, low carb diet)? No   Do you currently use any medical supplies? No   Does insurance pay for those supplies? No   Would you like assistance finding cheaper supplies? No    Patient does not own a smart phone.   Not willing to receive text messages about their health   Cancer of the Prostate:   Care Plan Review:   o Weak or slow urinary stream? Yes - Same as last month :barely weak he states   o No feeling of incomplete bladder emptying   o No urinary stream that starts and stops   o No difficulty starting urination   o Frequent or urgent urination? Yes - Better than last month :occ. when he knows he needs to go he goes   o Getting up frequently at night to urinate? Yes - Same as last month :2x a night   o No continued dribbling of urine    o Has not had any weight gain.   o Has not had noticed any pain/bone pain.   o Has not had any rashes   o Has not had any falls.   o Have you had feelings of depression? Yes - Same as last month :started anxiety meds.   o Has not had hot flashes.   o Has not had worsening fatique.   o Course of treatment for Prostate  Cancer: Prostate Cancer: 1 year ago he had surgery and had his Prostate removed.  March: Brittany reported he has been experiencing more urinary leakage. He reported he has noticed some more waking up throughout the night time. He reports no changes in the color/smell of his urine, and no urinary pain.  Hypertension:   Care Plan Review:   o Doctor recommended regular check of BP  o Following a low salt diet pt states he tries to eat healthy.  Symptom Review:   ? No symptoms this month.   March: Dravon reported his BP yesterday was elevated around 148/87.  He stated that his BP today is much better ranging in the 112/60 range.  Nocturia:   Care Plan Review:   o How many times a night do you wake to urinate? 2x   o No bedwetting   o No decrease in urine production   o No personal care products/depends/pads used during the night   o Does not take medications for this condition.   o Is not experiencing any side effects from these medications.   o Does not miss doses of medications.  Comments: March: Kamel reported he has been experiencing more urinary leakage. He reported he has noticed some more waking up throughout the night time. He reports no changes in the color/smell of his urine, and no urinary pain.  A copy of this updated Care Plan was made available to Herbie [place] Given to ensure ongoing monitoring by the physician.   A copy was also made available to the patient and their care team via the Patient Portal.  Diagnosis   ? 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 C61: Malignant neoplasm of prostate  Procedures   ? 33295: Chronic care management services, at least 20 minutes of clinical  staff time directed by a physician or other qualified health care professional, per calendar month.  The above conditions were addressed with the patient today.  The combination and progression of these chronic conditions have the ability to negatively impact the patient's overall quality of life or place the patient at risk of acute exacerbation or functional decline.      ~~This report was copied and pasted from the servicing EHR system.~~

## 2018-01-24 NOTE — Progress Notes (Signed)
Patient Name: [name] ([id number]) CCM Ordered On: 11/15/2016   DOB: 04/20/1946 (72 year old) Verbal Consent Obtained On: 04/24/2017   Phone: [phone] Date of Service: 01/24/2018   Address: [address]     Chronic Care Management     Problem List   1. 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 Malignant neoplasm of prostate  Current Medications   flecainide 50 mg tablet - Dispense: Tablet; TABLET ORAL   imipramine 50 mg tablet - Dispense: Tablet; TABLET ORAL   lisinopril 5 mg tablet - Dispense: Tablet; TABLET ORAL   losartan 50 mg tablet - Dispense: Tablet; TABLET ORAL   metformin 1,000 mg tablet - Dispense: Tablet; TABLET ORAL   metoprolol succinate ER 25 mg tablet,extended release 24 hr - Dispense: Tablet; Tablet Sustained Release 24 hr ORAL   mirtazapine 30 mg tablet - Dispense: Tablet; TABLET ORAL   Xarelto 20 mg tablet - Dispense: Tablet; TABLET ORAL   zolpidem 10 mg tablet - Dispense: Tablet; TABLET ORAL  Condition Management Care Plan  The physician has established and agreed upon a plan of care with the patient as outlined below. During the initial call on 04/24/2017, a medical assistant obtained consent from the patient to receive CCM services and the patient was advised to call our office if they ever wish to opt out of or revoke CCM services. The medical assistant reviewed the Care Plan with the patient and provided an updated status on each element of the Care Plan.   Significant Changes   Still experiencing some leakage on/off.   Monthly Health Questions:   Chief Complaint: Prostate Cancer  Nocturia   Hypertension    Call Date: 01/24/2018. Call completed by Azell Der. Verbal consent to participate in the Chronic Care Management program was obtained prior to the call.  Start Time: 3:21 P.M. End Time: 3:41 P.M.   Time spent on the phone with patient: 5 minutes  Time spent on care coordination: 5 minutes  Time spent on documentation: 10 minutes    What is your email  address? n/a   Any recent insurance changes? : No verified on 01/24/2018   Do you currently receive monthly phone calls or chronic care management services through any of your other providers? No   Do you have an emergency contact or caregiver? Yes Daughter   If yes, what is their name and phone number in case of an emergency? [place] reported overall physically/mentally he has been fine. He reported no major changes since we last spoke. He stated that he hasn't seen any doctors this month, and doesn't have any scheduled coming up.   [place] reported no changes in his medications, and no recent ER visits/hospital stays. Saurav stated that his urinary system has been about the same. He stated that he still experiences some urinary leakage on/off. He stated that he hasn't had any changes to the color/smell of his urine, and no urinary pain.     I spent 5 mins talking with patient.  I spent 5 mins updating meds.  I spent 10 mins charting/uploading call.    If we are ever unable to reach you for your monthly check-in, is it okay to send you information on care coordination? Yes. :01/24/2018  Would you like to receive any additional information about our Partners who may be able to assist with any of your healthcare needs? Some of our partners include Nurse, adult, [employer], [employer], etc.? No :not at  this current time.  [name] stated he has been feeling okay.   No numerical value given.    Does not have pain today  Patient states he has been doing okay.  No numerical value given.  Patient runs around a lot throughout the day. Takes his grandkids to appts, school events.  No numerical value given.    Has not had any new symptoms since we spoke   Previous symptoms have not worsened   Does NOT have an MD apt scheduled this month   Does NOT have any questions listed to ask your doctor at your next appointment   Has not seen any doctor(s) since last spoke   Has not been to the ER since last spoke   Has  not been hospitalized since last spoke  Current weight is 200. No changes in appetite    Has not had any fevers this past month   Has NOT fallen since last time we spoke   Has NOT had loss of balance or dizziness since we last spoke   Has NOT had new skin lesions noticed  Sleeping poorly :paitent wakes up a lot.  Has been having regular bowel movements  Has not had any changes in medication    All medications currently filled   Taking medications as prescribed   Not having any problems or side effects from medications   Is following the care plan recommended by your doctors   Not following recommended diet pt tries to eat healthy   Does not smoke   Last colonoscopy date: couple years ago   Gotten a flu or pneumonia shot this year.   Do you know when your last vision screening was? Yes 2-3 months ago   Do you currently need any coupons for any of your medications? No   Do you need any diet specific recipes (i.e. kidney, renal, low carb diet)? No   Do you currently use any medical supplies? No   Does insurance pay for those supplies? No   Would you like assistance finding cheaper supplies? No    Patient does not own a smart phone.   Not willing to receive text messages about their health    Patient does not want to have more information ready during the next monthly call.   Cancer of the Prostate:   Care Plan Review:   Weak or slow urinary stream? Yes - Same as last month :barely weak he states   No feeling of incomplete bladder emptying   No urinary stream that starts and stops   No difficulty starting urination   Frequent or urgent urination? Yes - Better than last month :occ. when he knows he needs to go he goes   Getting up frequently at night to urinate? Yes - Same as last month :2x a night   No continued dribbling of urine   Has not had any weight gain.   Has not had noticed any pain/bone pain.   Has not had any rashes   Has not had any falls.   Have you had feelings of depression? Yes - Same as last month  :started anxiety meds.   Has not had hot flashes.   Has not had worsening fatique.   Course of treatment for Prostate Cancer: Prostate Cancer: 1 year ago he had surgery and had his Prostate removed.  April: Danh reported that he is still experiencing some urinary leakage. He stated that he isn't having any changes in the color/smell of his  urine, and no urinary pain.  Hypertension:   Care Plan Review:   Doctor recommended regular check of BP  Following a low salt diet pt states he tries to eat healthy.  Symptom Review:   No symptoms this month.   April: Did not elaborate.  Nocturia:   Care Plan Review:   How many times a night do you wake to urinate? 2x   No bedwetting   No decrease in urine production   No personal care products/depends/pads used during the night   Does not take medications for this condition.   Is not experiencing any side effects from these medications.   Does not miss doses of medications.  Comments: April: [place] reported that he is still experiencing some urinary leakage. He stated that he isn't having any changes in the color/smell of his urine, and no urinary pain.  A copy of this updated Care Plan was made available to Herbie [place] Given to ensure ongoing monitoring by the physician.   A copy was also made available to the patient and their care team via the Patient Portal.  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  The above conditions were addressed with the patient today. The combination and progression of these chronic conditions have the ability to negatively impact the patient's overall quality of life or place the patient at risk of acute exacerbation or functional decline.       ~~This report was copied and pasted from the servicing EHR  system.~~

## 2018-01-24 NOTE — Progress Notes (Signed)
Patient Name: [name] ([id number]) CCM Ordered On: 11/15/2016   DOB: 10/30/1945 (72 year old) Verbal Consent Obtained On: 04/24/2017   Phone: [phone] Date of Service: 01/24/2018   Address: [address]     Chronic Care Management     Problem List   1. 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 Malignant neoplasm of prostate  Current Medications   flecainide 50 mg tablet - Dispense: Tablet; TABLET ORAL   imipramine 50 mg tablet - Dispense: Tablet; TABLET ORAL   lisinopril 5 mg tablet - Dispense: Tablet; TABLET ORAL   losartan 50 mg tablet - Dispense: Tablet; TABLET ORAL   metformin 1,000 mg tablet - Dispense: Tablet; TABLET ORAL   metoprolol succinate ER 25 mg tablet,extended release 24 hr - Dispense: Tablet; Tablet Sustained Release 24 hr ORAL   mirtazapine 30 mg tablet - Dispense: Tablet; TABLET ORAL   Xarelto 20 mg tablet - Dispense: Tablet; TABLET ORAL   zolpidem 10 mg tablet - Dispense: Tablet; TABLET ORAL  Condition Management Care Plan  The physician has established and agreed upon a plan of care with the patient as outlined below. During the initial call on 04/24/2017, a medical assistant obtained consent from the patient to receive CCM services and the patient was advised to call our office if they ever wish to opt out of or revoke CCM services. The medical assistant reviewed the Care Plan with the patient and provided an updated status on each element of the Care Plan.   Significant Changes   Still experiencing some leakage on/off.   Monthly Health Questions:   Chief Complaint: Prostate Cancer  Nocturia   Hypertension    Call Date: 01/24/2018. Call completed by Azell Der. Verbal consent to participate in the Chronic Care Management program was obtained prior to the call.  Start Time: 3:21 P.M. End Time: 3:41 P.M.   Time spent on the phone with patient: 5 minutes  Time spent on care coordination: 5 minutes  Time spent on documentation: 10 minutes     What is your email address? n/a   Any recent insurance changes? : No verified on 01/24/2018   Do you currently receive monthly phone calls or chronic care management services through any of your other providers? No   Do you have an emergency contact or caregiver? Yes Daughter   If yes, what is their name and phone number in case of an emergency? [name] reported overall physically/mentally he has been fine. He reported no major changes since we last spoke. He stated that he hasn't seen any doctors this month, and doesn't have any scheduled coming up.   [name] reported no changes in his medications, and no recent ER visits/hospital stays. [name] stated that his urinary system has been about the same. He stated that he still experiences some urinary leakage on/off. He stated that he hasn't had any changes to the color/smell of his urine, and no urinary pain.     I spent 5 mins talking with patient.  I spent 5 mins updating meds.  I spent 10 mins charting/uploading call.    If we are ever unable to reach you for your monthly check-in, is it okay to send you information on care coordination? Yes. :01/24/2018  Would you like to receive any additional information about our Partners who may be able to assist with any of your healthcare needs? Some of our partners include Nurse, adult, [employer], [employer], etc.? No :not at  this current time.  [name] stated he has been feeling okay.   No numerical value given.    Does not have pain today  Patient states he has been doing okay.  No numerical value given.  Patient runs around a lot throughout the day. Takes his grandkids to appts, school events.  No numerical value given.    Has not had any new symptoms since we spoke   Previous symptoms have not worsened   Does NOT have an MD apt scheduled this month   Does NOT have any questions listed to ask your doctor at your next appointment   Has not seen any doctor(s) since last spoke    Has not been to the ER since last spoke   Has not been hospitalized since last spoke  Current weight is 200. No changes in appetite    Has not had any fevers this past month   Has NOT fallen since last time we spoke   Has NOT had loss of balance or dizziness since we last spoke   Has NOT had new skin lesions noticed  Sleeping poorly :paitent wakes up a lot.  Has been having regular bowel movements  Has not had any changes in medication    All medications currently filled   Taking medications as prescribed   Not having any problems or side effects from medications   Is following the care plan recommended by your doctors   Not following recommended diet pt tries to eat healthy   Does not smoke   Last colonoscopy date: couple years ago   Gotten a flu or pneumonia shot this year.   Do you know when your last vision screening was? Yes 2-3 months ago   Do you currently need any coupons for any of your medications? No   Do you need any diet specific recipes (i.e. kidney, renal, low carb diet)? No   Do you currently use any medical supplies? No   Does insurance pay for those supplies? No   Would you like assistance finding cheaper supplies? No    Patient does not own a smart phone.   Not willing to receive text messages about their health    Patient does not want to have more information ready during the next monthly call.   Cancer of the Prostate:   Care Plan Review:   Weak or slow urinary stream? Yes - Same as last month :barely weak he states   No feeling of incomplete bladder emptying   No urinary stream that starts and stops   No difficulty starting urination   Frequent or urgent urination? Yes - Better than last month :occ. when he knows he needs to go he goes   Getting up frequently at night to urinate? Yes - Same as last month :2x a night   No continued dribbling of urine   Has not had any weight gain.   Has not had noticed any pain/bone pain.   Has not had any rashes   Has not had any falls.    Have you had feelings of depression? Yes - Same as last month :started anxiety meds.   Has not had hot flashes.   Has not had worsening fatique.   Course of treatment for Prostate Cancer: Prostate Cancer: 1 year ago he had surgery and had his Prostate removed.  April: [place] reported that he is still experiencing some urinary leakage. He stated that he isn't having any changes in the color/smell of his  urine, and no urinary pain.  Hypertension:   Care Plan Review:   Doctor recommended regular check of BP  Following a low salt diet pt states he tries to eat healthy.  Symptom Review:   No symptoms this month.   April: Did not elaborate.  Nocturia:   Care Plan Review:   How many times a night do you wake to urinate? 2x   No bedwetting   No decrease in urine production   No personal care products/depends/pads used during the night   Does not take medications for this condition.   Is not experiencing any side effects from these medications.   Does not miss doses of medications.  Comments: April: Arin reported that he is still experiencing some urinary leakage. He stated that he isn't having any changes in the color/smell of his urine, and no urinary pain.  A copy of this updated Care Plan was made available to Herbie [place] Given to ensure ongoing monitoring by the physician.   A copy was also made available to the patient and their care team via the Patient Portal.  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  The above conditions were addressed with the patient today. The combination and progression of these chronic conditions have the ability to negatively impact the patient's overall quality of life or place the patient at risk of acute exacerbation or functional decline.        ~~This report was copied and pasted from the servicing EHR system.~~

## 2018-04-02 ENCOUNTER — Encounter: Attending: Urology | Primary: Internal Medicine

## 2018-07-08 IMAGING — CR DG CHEST 2V
2 series · 2 of 2 positions shown · non-contrast
Comparison: None.

CLINICAL DATA: Atrial fibrillation.  Left-sided chest pain.

EXAM:
CHEST  2 VIEW

[chest lat]
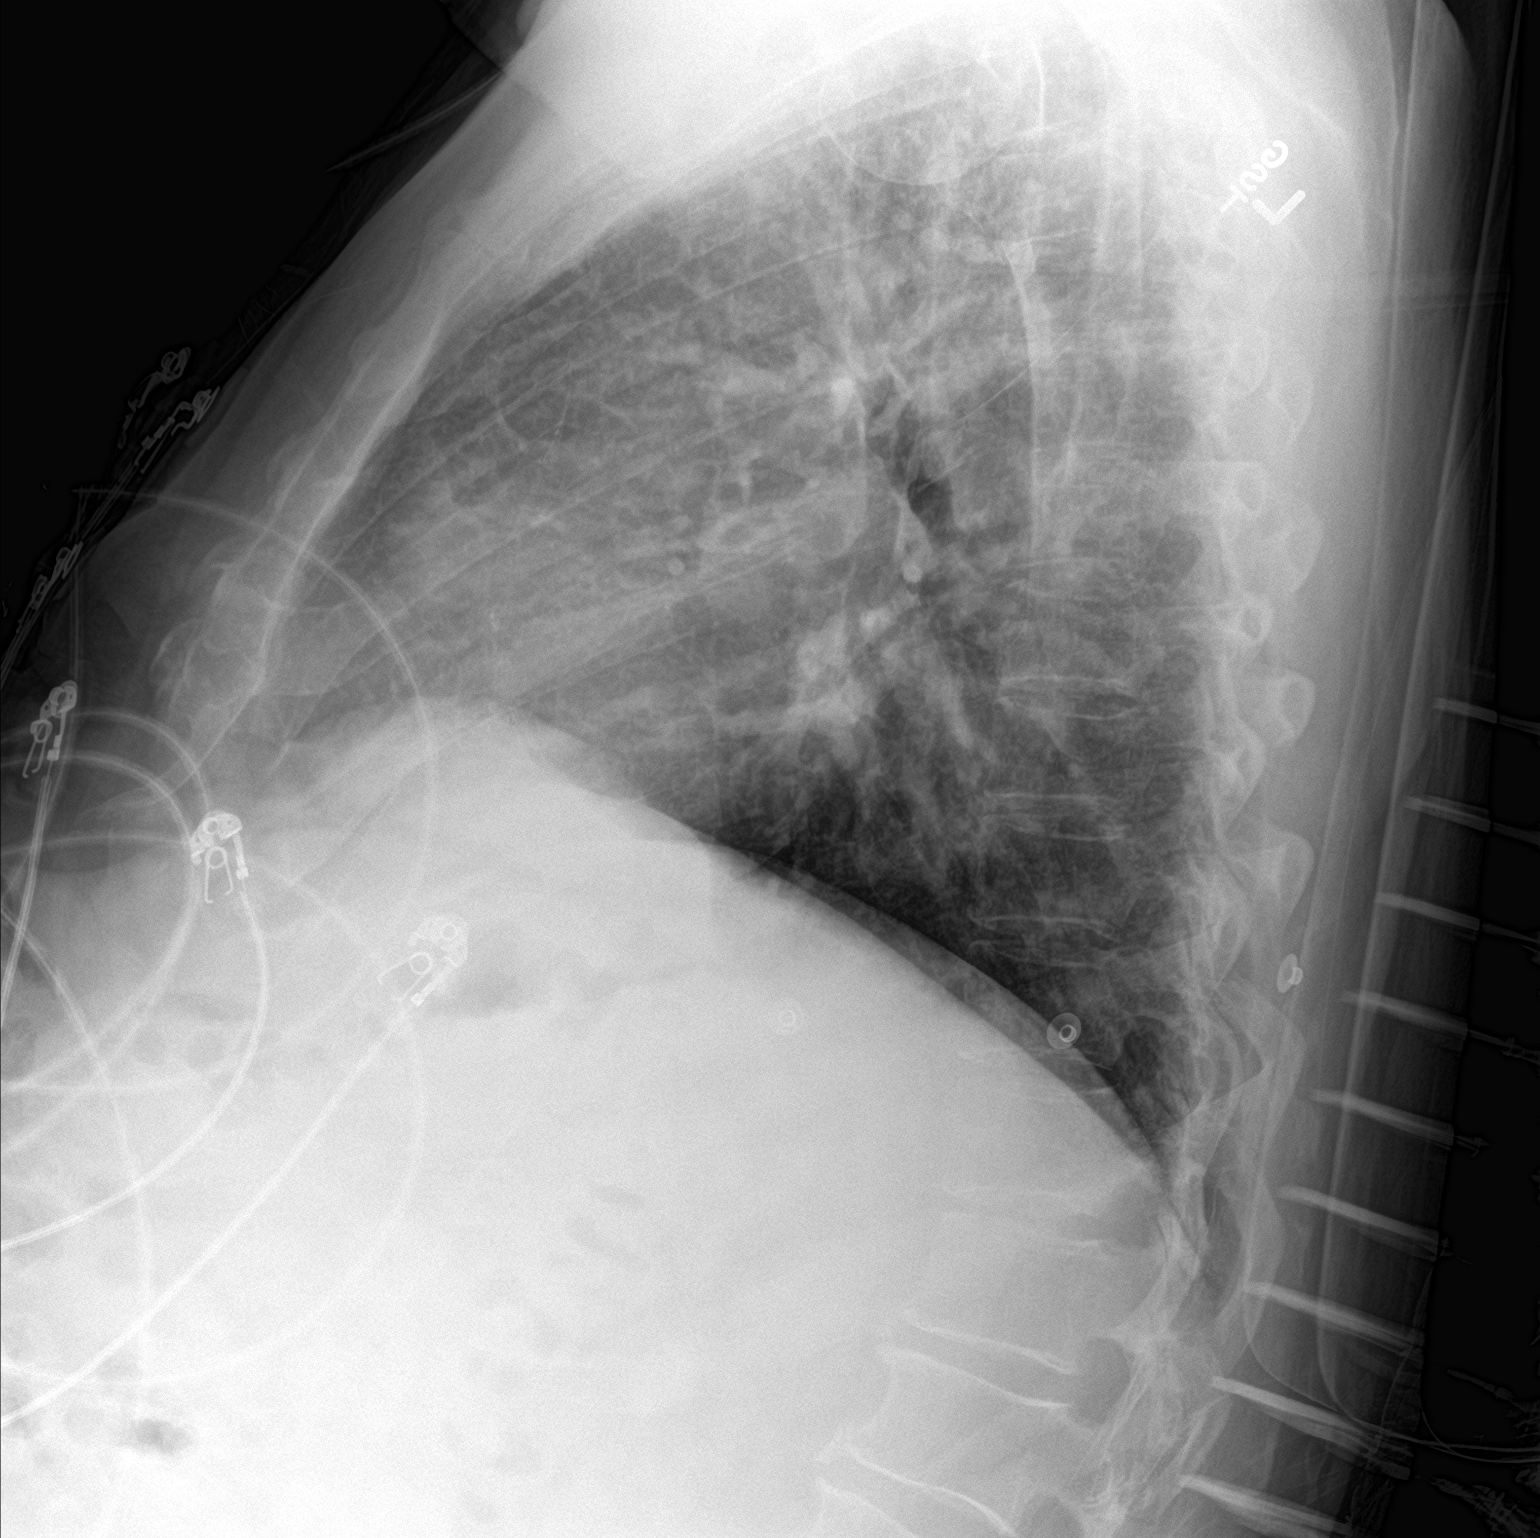

[chest ap]
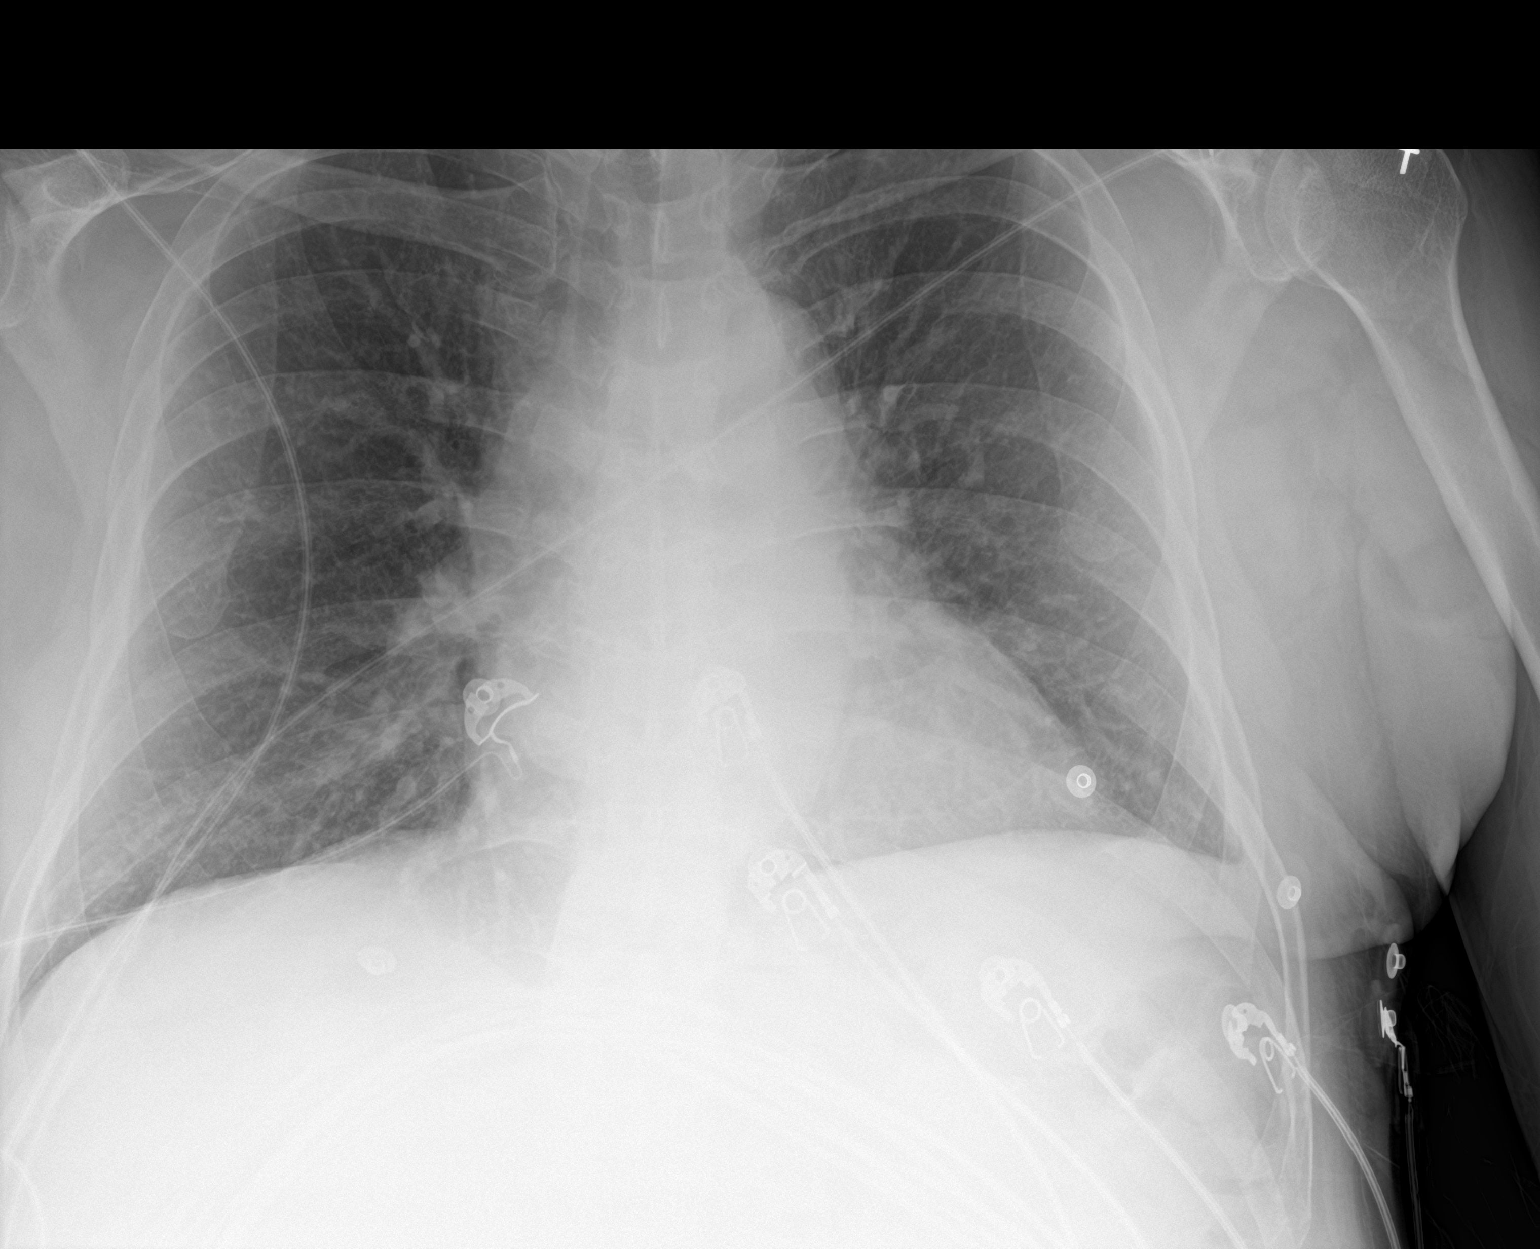

[2 of 2 positions shown; findings below may reference images not displayed]

FINDINGS: Normal heart size and pulmonary vascularity. No airspace disease or
consolidation in the lungs. Mild blunting of the left costophrenic
angle may indicate a small pleural effusion or pleural thickening.
No pneumothorax. Tortuous aorta. Degenerative changes in the spine.
IMPRESSION: Fluid or thickened pleura blunting the left costophrenic angle. No
evidence of active pulmonary disease.

## 2019-04-02 NOTE — Procedures (Signed)
Associated Order(s): CARDIOVERSION EXTERNAL  Formatting of this note might be different from the original.  Images from the original note were not included.  [name] is a 73 y.o. male patient.  Blood pressure 103/65, pulse 70, resp. rate 15, SpO2 97 %.    ASA 3 - Patient with moderate systemic disease with functional limitations  [facility] Cardiology  Cardioversion Report    [name]  1946/04/04  [id number]  Physician: Barnet Pall    Procedure Type:  Direct Current Cardioversion (DCCV)    Indication:  Atrial Fibrillation    Procedural Details: Patient was consented for moderate sedation direct current cardioversion. Brought to the cardiovascular laboratory and prepared in the usual fashion.  Single 200J biphasic anterior-posterior shock delivered with immediate resumption of normal sinus rhythm.  There was no significant pause, and no recurrence of atrial arrhythmia prior to leaving the cardiovascular laboratory.    Post-DCCV EKG: Normal sinus rhythm at 60 bpm, normal axis and intervals, no ST/TWave changes    Impression: Successful DCCV with return to Normal Sinus Rhythm    Plan:  Rate Control / Rhythm Control Medications:  No change  Anticoagulants:  Chronic  Follow-Up:  4 weeks for assessment of maintenance of Normal Sinus Rhythm and decisions on long-term anticoagulation.    Ola Spurr MD [facility] Cardiology  Cardiac and Vascular Interventions    .  Barnet Pall  04/02/2019  Electronically signed by Barnet Pall, MD at 04/02/2019  1:58 PM EDT

## 2019-05-15 ENCOUNTER — Emergency Department: Admit: 2019-05-16 | Payer: MEDICARE | Primary: Internal Medicine

## 2019-05-15 DIAGNOSIS — I480 Paroxysmal atrial fibrillation: Secondary | ICD-10-CM

## 2019-05-15 NOTE — ED Provider Notes (Signed)
[facility]  Emergency Department Treatment Report    Patient: [name] Age: 73 y.o. Sex: male    Date of Birth: 06/29/1946 Admit Date: 05/15/2019 PCP: Maylon Peppers, MD   MRN: [id number]  CSN: [id number]  Attending: Maylon Cos, MD   Room: [id number] Time Dictated: 8:58 PM APP:  Virgina Jock, PA-C     Chief Complaint   Irregular heart beat; dizziness   History of Present Illness   73 y.o. male with a history of paroxysmal atrial fibrillation presents complaining of intermittent dizziness and lightheadedness since 1530 this afternoon.  This evening he had a near syncopal episode, felt lightheaded with standing and felt like he was going to pass out however denies syncope; this prompted him to come to the ED.  Denies chest pain, shortness of breath, palpitations, nausea, vomiting or diaphoresis.  He is currently on Xarelto and recently started on flecainide 2 months ago.    Review of Systems     Constitutional: No fever, chills  Eyes: No visual symptoms.  ENT: No sore throat, runny nose  Respiratory: No cough, dyspnea or wheezing.  Cardiovascular: No chest pain, palpitations,   Gastrointestinal: No vomiting, diarrhea or abdominal pain.  Genitourinary: No dysuria or  frequency  Musculoskeletal: No lower extremity swelling  Integumentary: No rashes.  Neurological: Positive dizziness, lightheadedness and near syncope; no headaches  Denies complaints in all other systems.    Past Medical/Surgical History     Past Medical History:   Diagnosis Date   ??? A-fib ([place])    ??? Benign prostatic hyperplasia with lower urinary tract symptoms    ??? BPH (benign prostatic hypertrophy) with urinary obstruction    ??? Diabetes mellitus ([place])    ??? Elevated prostate specific antigen (PSA)    ??? Enlarged prostate with lower urinary tract symptoms (LUTS)    ??? History of needle biopsy of prostate with negative result    ??? History of urinary retention    ??? Hypercholesteremia    ??? Hypertrophy of prostate with urinary  obstruction and other lower urinary tract symptoms (LUTS)    ??? 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 Urinary retention    ??? UTI (lower urinary tract infection)      Past Surgical History:   Procedure Laterality Date   ??? HX CYST REMOVAL     ??? HX PROSTATECTOMY  05/04/2016    DVP Dr Given   ??? HX UROLOGICAL  01/20/2016    PNBx-TRUS Vol 70cc's, Gleason,4+3 occupying core and 50% of the Bx  material, 3+4 present in cores and occupying 30% of the Bx specimen, 4+3 present in cores and occupying 40% of the Bx specimen, 3+4 presen tin core and occupying 35% of the Bx material, Dr Wynona Luna        Social History     Social History     Socioeconomic History   ??? Marital status: DIVORCED     Spouse name: Not on file   ??? Number of children: Not on file   ??? Years of education: Not on file   ??? Highest education level: Not on file   Tobacco Use   ??? Smoking status: Never Smoker   ??? Smokeless tobacco: Never Used   Substance and Sexual Activity   ??? Alcohol use: No   ??? Drug use: No  Family History     Family History   Problem Relation Age of Onset   ??? Diabetes Mother    ??? Hypertension Mother    ??? Diabetes Sister    ??? Hypertension Sister    ??? Stroke Sister        Home Medications     Prior to Admission Medications   Prescriptions Last Dose Informant Patient Reported? Taking?   ACCU-CHEK AVIVA PLUS TEST STRP strip   Yes No   XARELTO 20 mg tab tablet   Yes No   Sig: 20 mg daily (with dinner).   acetaminophen (TYLENOL) 325 mg tablet   Yes No   Sig: Take 650 mg by mouth.   lisinopril (PRINIVIL, ZESTRIL) 5 mg tablet   Yes No   Sig: Take 5 mg by mouth daily. Indications: hypertension   metFORMIN (GLUCOPHAGE) 1,000 mg tablet   Yes No   Sig: 1,000 mg two (2) times daily (with meals).   metoprolol (LOPRESSOR) 25 mg tablet   Yes No   Sig: 25 mg daily.   raNITIdine (ZANTAC) 150 mg tablet   Yes No   Sig: 150 mg.   zolpidem  (AMBIEN) 10 mg tablet   Yes No   Sig: 1/2-1 PO daily      Facility-Administered Medications: None       Allergies   No Known Allergies    Physical Exam     ED Triage Vitals   ED Encounter Vitals Group      BP 05/15/19 1902 137/87      Pulse (Heart Rate) 05/15/19 1902 (!) 102      Resp Rate 05/15/19 1902 14      Temp 05/15/19 1902 98.4 ??F (36.9 ??C)      Temp src --       O2 Sat (%) 05/15/19 1902 98 %      Weight 05/15/19 1902 200 lb      Height 05/15/19 1902 5\' 8"        Constitutional: Patient appears well developed and well nourished. Appearance and behavior are age and situation appropriate.  HEENT: Conjunctiva clear. PERRLA. Mucous membranes moist, non-erythematous. Surface of the pharynx, palate, and tongue are pink, moist and without lesions.  Neck: supple, non tender, symmetrical, no masses or JVD. No lymphadenopathy.  Respiratory: lungs clear to auscultation, nonlabored respirations. No tachypnea or accessory muscle use.  Cardiovascular: Irregular rate and rhythm without murmur rubs or gallops.    Gastrointestinal:  Abdomen soft, nondistended, nontender without complaint of pain to palpation, no CVA tenderness noted bilaterally  Musculoskeletal: Calves soft and non-tender. Distal pulses 2+ and equal bilaterally.  No peripheral edema   Integumentary: warm and dry without rashes or lesions  Neurologic: alert and oriented. No facial asymmetry or dysarthria. Moving all extremities well    Impression and Management Plan   73 y.o. male presents complaining of intermittent dizziness, lightheadedness and near syncope.  Patient is afebrile and his vital signs are hemodynamically stable.  Patient overall well-appearing, nontoxic.  He is alert and oriented x3, neurologically intact.  His physical exam is benign.  We will obtain EKG, chest x-ray and appropriate labs for further evaluation.    Diagnostic Studies   Lab:   Recent Results (from the past 12 hour(s))   CBC WITH AUTOMATED DIFF    Collection Time: 05/15/19  7:25  PM   Result Value Ref Range    WBC 7.8 4.0 - 11.0 1000/mm3    RBC 4.22 3.80 - 5.70  M/uL    HGB 12.9 12.4 - 17.2 gm/dl    HCT 39.3 37.0 - 50.0 %    MCV 93.1 80.0 - 98.0 fL    MCH 30.6 23.0 - 34.6 pg    MCHC 32.8 30.0 - 36.0 gm/dl    PLATELET 195 140 - 450 1000/mm3    MPV 10.4 (H) 6.0 - 10.0 fL    RDW-SD 43.8 35.1 - 43.9      NRBC 0 0 - 0      IMMATURE GRANULOCYTES 0.3 0.0 - 3.0 %    NEUTROPHILS 48.9 34 - 64 %    LYMPHOCYTES 37.1 28 - 48 %    MONOCYTES 7.2 1 - 13 %    EOSINOPHILS 5.7 (H) 0 - 5 %    BASOPHILS 0.8 0 - 3 %   METABOLIC PANEL, BASIC    Collection Time: 05/15/19  7:25 PM   Result Value Ref Range    Sodium 139 136 - 145 mEq/L    Potassium 4.5 3.5 - 5.1 mEq/L    Chloride 108 (H) 98 - 107 mEq/L    CO2 28 21 - 32 mEq/L    Glucose 108 (H) 74 - 106 mg/dl    BUN 17 7 - 25 mg/dl    Creatinine 0.8 0.6 - 1.3 mg/dl    GFR est AA >60      GFR est non-AA >60      Calcium 9.1 8.5 - 10.1 mg/dl    Anion gap 4 (L) 5 - 15 mmol/L   TROPONIN I    Collection Time: 05/15/19  7:25 PM   Result Value Ref Range    Troponin-I <0.015 0.000 - 0.045 ng/ml     EKG??? atrial flutter with variable AV block, incomplete right bundle branch block, prolonged QT, vent rate 106 bpm, QRS duration 104 MS, QTC 523 MS, Dr. Emeterio Reeve did not see any acute S-T segment or T-wave abnormalities that are consistent with acute ischemia or infarction.    Imaging:    Xr Chest Sngl V    Result Date: 05/15/2019  IMPRESSION: No acute cardiopulmonary process.       ED Course/Medical Decision Making   Patient remained stable throughout her stay in the ED with no new or worsening symptoms.  I have reviewed the results with the patient.  EKG shows atrial flutter however rate controlled here in the ED.  Labs unremarkable.  Chest x-ray unremarkable.  Patient still feels intermittently lightheaded especially with standing.  I feel that patient meets criteria for admission for further cardiac evaluation and patient agreeable to this plan.  I have discussed this patient  with [facility] hospitalist, Dr. Winona Legato who kindly accepts this patient for admission.  Medications - No data to display  Final Diagnosis        ICD-10-CM ICD-9-CM   1. Dizziness  R42 780.4   2. Near syncope  R55 780.2   3. Atrial flutter, unspecified type ([place])  I48.92 427.32       Disposition   Admitted      Current Discharge Medication List        The patient was fully discussed with [facility], Jeanine Luz, MD who agrees with the above assessment and plan      Ronette Deter PA-C  May 15, 2019    My signature above authenticates this document and my orders, the final ??  diagnosis (es), discharge prescription (s), and instructions in the Epic ??  record.  If you have  any questions please contact [phone].  ??  Nursing notes have been reviewed by the physician/ advanced practice ??  Clinician.      Continued by Dr. Hermina Barters dictation software was used for portions of this report. Unintended errors in transcription may occur.    I interviewed and examined the patient. I discussed with the mid-level provider agree with their evaluation and plan as documented here.      Patient is awake alert conversive  ekg shows afib/flutter no STE  CXR without acute finding  Labs reviewed  Lungs are clear heart is irregular abdomen is not tender to palpation  Patient endorses near syncope but not syncope  Patient was stable on monitor, BP ok  Moving arms and leg w/o difficulty  Patient was d/w hospitalist by [place] and admitted

## 2019-05-15 NOTE — ED Notes (Signed)
C/o sudden onset of lightheadedness with near syncope at 1600. Denies shortness of breath or chest pain. EKG done in triage. Hx of a.fib

## 2019-05-15 NOTE — H&P (Signed)
Hospitalist Admission History and Physical    NAME:  [name]   DOB:   Aug 17, 1946   MRN:   [id number]     PCP:  Darius Peppers, MD  Admission Date/Time:  05/15/2019 10:04 PM  Anticipated Date of Discharge: 05/18/2019  Anticipated Disposition (home, SNF) : HH         Assessment/Plan:      Active Problems:    Near syncope (05/15/2019)    Near syncope  Atrial fibrillation/flutter  Uncontrolled type 2 diabetes mellitus with hyperglycemia  Hyperlipidemia      ___________________________________________________  PLAN:    Admit the patient to telemetry  Watch for any AV block, fast a flutter possibly causing the symptoms  Cardiology consult  Continue beta-blocker  Trend troponin  Check echocardiogram    EKG showed a flutter with variable AV block, prolonged QT QTc 523  I will hold flecainide dose in the morning till seen by cardiology, recheck EKG in the morning      Risk of deterioration:  [] Low    [x] Moderate  [] High    Prophylaxis:  [] Lovenox  [] Coumadin  [] Hep SQ  [] SCD???s  [] H2B/PPI[] Eliquis [x] Xarelto     Disposition:  [] Home w/ Family   [x] HH PT,OT,RN   [] SNF/LTC   [] SAH/Rehab             Subjective:   CHIEF COMPLAINT:    Chief Complaint   Patient presents with   ??? Irregular Heart Beat   ??? Dizziness       HISTORY OF PRESENT ILLNESS:     [name] is a 73 y.o. WHITE OR CAUCASIAN male who presents with dizziness, lightheadedness since 330 this afternoon  Patient this evening had a near syncope episode felt like he was going to pass out however denies losing consciousness  Denies any chest pain, shortness of breath palpitation nausea vomiting, has history of atrial fibrillation, is on flecainide the last 2 months  Patient has history of A. fib, recently moved back to this area after having gone to [place] [employer]  for a job, said few weeks ago had another episodes of A. fib, following which his flecainide was increased to twice a day  He said he intermittently gets this feeling of palpitations where he can tell his heart  rate is going fast, along with some chest discomfort but never had any chest pain  He continues to take Xarelto, beta-blocker    Past Medical History:   Diagnosis Date   ??? A-fib ([place])    ??? Benign prostatic hyperplasia with lower urinary tract symptoms    ??? BPH (benign prostatic hypertrophy) with urinary obstruction    ??? Diabetes mellitus ([place])    ??? Elevated prostate specific antigen (PSA)    ??? Enlarged prostate with lower urinary tract symptoms (LUTS)    ??? History of needle biopsy of prostate with negative result    ??? History of urinary retention    ??? Hypercholesteremia    ??? Hypertrophy of prostate with urinary obstruction and other lower urinary tract symptoms (LUTS)    ??? 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 Urinary retention    ??? UTI (lower urinary tract infection)         Past Surgical History:   Procedure Laterality Date   ??? HX CYST REMOVAL     ??? HX PROSTATECTOMY  05/04/2016  DVP Dr [name]   ??? HX UROLOGICAL  01/20/2016    PNBx-TRUS Vol 70cc's, Gleason,4+3 occupying core and 50% of the Bx  material, 3+4 present in cores and occupying 30% of the Bx specimen, 4+3 present in cores and occupying 40% of the Bx specimen, 3+4 presen tin core and occupying 35% of the Bx material, Dr [name]        Social History     Tobacco Use   ??? Smoking status: Never Smoker   ??? Smokeless tobacco: Never Used   Substance Use Topics   ??? Alcohol use: No        Family History   Problem Relation Age of Onset   ??? Diabetes Mother    ??? Hypertension Mother    ??? Diabetes Sister    ??? Hypertension Sister    ??? Stroke Sister         No Known Allergies     Prior to Admission Medications   Prescriptions Last Dose Informant Patient Reported? Taking?   ACCU-CHEK AVIVA PLUS TEST STRP strip   Yes No   XARELTO 20 mg tab tablet 05/15/2019 at Unknown time  Yes Yes   Sig: 20 mg daily (with dinner).   acetaminophen (TYLENOL) 325 mg tablet    Yes No   Sig: Take 650 mg by mouth.   lisinopril (PRINIVIL, ZESTRIL) 5 mg tablet   Yes No   Sig: Take 5 mg by mouth daily. Indications: hypertension   metFORMIN (GLUCOPHAGE) 1,000 mg tablet   Yes No   Sig: 1,000 mg two (2) times daily (with meals).   metoprolol (LOPRESSOR) 25 mg tablet   Yes No   Sig: 25 mg daily.   raNITIdine (ZANTAC) 150 mg tablet Not Taking at Unknown time  Yes No   Sig: 150 mg.   zolpidem (AMBIEN) 10 mg tablet Not Taking at Unknown time  Yes No   Sig: 1/2-1 PO daily      Facility-Administered Medications: None           REVIEW OF SYSTEMS:     []  Unable to obtain  ROS due to  [] mental status change  [] sedated   [] intubated   [x] Total of 12 systems reviewed as follows:  Constitutional: negative fever, negative chills, negative weight loss  Eyes:   negative visual changes  ENT:   negative sore throat, tongue or lip swelling  Respiratory:  negative cough, negative dyspnea  Cards:  Positive for palpitations, intermittent chest tightness  GI:   negative for nausea, vomiting, diarrhea, and abdominal pain  Genitourinary: negative for frequency, dysuria  Integument:  negative for rash and pruritus  Hematologic:  negative for easy bruising and gum/nose bleeding  Musculoskel: negative for myalgias,  back pain and muscle weakness  Neurological: Positive for dizziness, lightheadedness and near syncope  Behavl/Psych: negative for feelings of anxiety, depression       Objective:   VITALS:    Visit Vitals  BP 137/87   Pulse (!) 102   Temp 98.4 ??F (36.9 ??C)   Resp 14   Ht 5\' 8"  (1.727 m)   Wt 90.7 kg (200 lb)   SpO2 98%   BMI 30.41 kg/m??     Temp (24hrs), Avg:98.4 ??F (36.9 ??C), Min:98.4 ??F (36.9 ??C), Max:98.4 ??F (36.9 ??C)      PHYSICAL EXAM: (Seen with PPE , gloves , gown , mask n95 and goggles )  General:    Alert, cooperative, no distress, appears stated age.  Head:   Normocephalic, without obvious abnormality, atraumatic.  Eyes:   Conjunctivae clear, anicteric sclerae.  Pupils are equal  Nose:  Nares  normal. No drainage or sinus tenderness.  Throat:    Lips, mucosa, and tongue normal.  No Thrush  Neck:  Supple, symmetrical,  no adenopathy, thyroid: non tender    no carotid bruit and no JVD.  Back:    Symmetric,  No CVA tenderness.  Lungs:   Clear to auscultation bilaterally.  No Wheezing or Rhonchi. No rales.  Chest wall:  No tenderness or deformity. No Accessory muscle use.  Heart:   Regular rate and rhythm,  no murmur, rub or gallop.  Abdomen:   Soft, non-tender. Not distended.  Bowel sounds normal. No masses  Extremities: Extremities normal, atraumatic, No cyanosis.  No edema. No clubbing  Skin:     Texture, turgor normal. No rashes or lesions.  Not Jaundiced  Psych:  Good insight.  Not depressed.  Not anxious or agitated.  Neurologic: EOMs intact. No facial asymmetry. No aphasia or slurred speech. Normal  strength, Alert and oriented X 3.       LAB DATA REVIEWED:    Recent Results (from the past 12 hour(s))   CBC WITH AUTOMATED DIFF    Collection Time: 05/15/19  7:25 PM   Result Value Ref Range    WBC 7.8 4.0 - 11.0 1000/mm3    RBC 4.22 3.80 - 5.70 M/uL    HGB 12.9 12.4 - 17.2 gm/dl    HCT 39.3 37.0 - 50.0 %    MCV 93.1 80.0 - 98.0 fL    MCH 30.6 23.0 - 34.6 pg    MCHC 32.8 30.0 - 36.0 gm/dl    PLATELET 195 140 - 450 1000/mm3    MPV 10.4 (H) 6.0 - 10.0 fL    RDW-SD 43.8 35.1 - 43.9      NRBC 0 0 - 0      IMMATURE GRANULOCYTES 0.3 0.0 - 3.0 %    NEUTROPHILS 48.9 34 - 64 %    LYMPHOCYTES 37.1 28 - 48 %    MONOCYTES 7.2 1 - 13 %    EOSINOPHILS 5.7 (H) 0 - 5 %    BASOPHILS 0.8 0 - 3 %   METABOLIC PANEL, BASIC    Collection Time: 05/15/19  7:25 PM   Result Value Ref Range    Sodium 139 136 - 145 mEq/L    Potassium 4.5 3.5 - 5.1 mEq/L    Chloride 108 (H) 98 - 107 mEq/L    CO2 28 21 - 32 mEq/L    Glucose 108 (H) 74 - 106 mg/dl    BUN 17 7 - 25 mg/dl    Creatinine 0.8 0.6 - 1.3 mg/dl    GFR est AA >60      GFR est non-AA >60      Calcium 9.1 8.5 - 10.1 mg/dl    Anion gap 4 (L) 5 - 15 mmol/L   TROPONIN I     Collection Time: 05/15/19  7:25 PM   Result Value Ref Range    Troponin-I <0.015 0.000 - 0.045 ng/ml         IMAGING RESULTS:    Xr Chest Sngl V    Result Date: 05/15/2019  EXAM:  Chest AP INDICATIONS: palpitations, lightheadedness, dizziness, irregular heart rate COMPARISON: Most recently 07/05/2017. FINDINGS: Chronic blunting of the left costophrenic angle, unchanged. No consolidation, pleural effusion, or pulmonary edema. No pneumothorax. Normal cardiomediastinal contours.  IMPRESSION: No acute cardiopulmonary process.       Care Plan discussed with:     [x] Patient   [] Family    [] ED Care Manager  [] ED Doc   [] Specialist :    Total care time spent on reviewing the case/data/notes/EMR,examining the patient,documentation,coordinating care with nurses and consultants is 40 minutes.       ___________________________________________________  Admitting Physician: Charlaine Dalton, MD     Dragon medical dictation software was used for portions of this report.  Unintended voice transcription errors may have occurred.

## 2019-05-15 NOTE — ED Triage Notes (Signed)
C/o sudden onset of lightheadedness with near syncope at 1600. Denies shortness of breath or chest pain. EKG done in triage. Hx of a.fib

## 2019-05-15 NOTE — ED Provider Notes (Addendum)
[place] [place]  Emergency Department Treatment Report    Patient: [name] Age: 73 y.o. Sex: male    Date of Birth: 1946/04/13 Admit Date: 05/15/2019 PCP: Maylon Peppers, MD   MRN: [id number]  CSN: [id number]  Attending: Maylon Cos, MD   Room: [phone] Time Dictated: 8:58 PM APP:  Virgina Jock, PA-C     Chief Complaint   Irregular heart beat; dizziness   History of Present Illness   73 y.o. male with a history of paroxysmal atrial fibrillation presents complaining of intermittent dizziness and lightheadedness since 1530 this afternoon.  This evening he had a near syncopal episode, felt lightheaded with standing and felt like he was going to pass out however denies syncope; this prompted him to come to the ED.  Denies chest pain, shortness of breath, palpitations, nausea, vomiting or diaphoresis.  He is currently on Xarelto and recently started on flecainide 2 months ago.    Review of Systems     Constitutional: No fever, chills  Eyes: No visual symptoms.  ENT: No sore throat, runny nose  Respiratory: No cough, dyspnea or wheezing.  Cardiovascular: No chest pain, palpitations,   Gastrointestinal: No vomiting, diarrhea or abdominal pain.  Genitourinary: No dysuria or  frequency  Musculoskeletal: No lower extremity swelling  Integumentary: No rashes.  Neurological: Positive dizziness, lightheadedness and near syncope; no headaches  Denies complaints in all other systems.    Past Medical/Surgical History     Past Medical History:   Diagnosis Date   ??? A-fib ([place])    ??? Benign prostatic hyperplasia with lower urinary tract symptoms    ??? BPH (benign prostatic hypertrophy) with urinary obstruction    ??? Diabetes mellitus ([place])    ??? Elevated prostate specific antigen (PSA)    ??? Enlarged prostate with lower urinary tract symptoms (LUTS)    ??? History of needle biopsy of prostate with negative result    ??? History of urinary retention    ??? Hypercholesteremia     ??? Hypertrophy of prostate with urinary obstruction and other lower urinary tract symptoms (LUTS)    ??? 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 Urinary retention    ??? UTI (lower urinary tract infection)      Past Surgical History:   Procedure Laterality Date   ??? HX CYST REMOVAL     ??? HX PROSTATECTOMY  05/04/2016    DVP Dr Given   ??? HX UROLOGICAL  01/20/2016    PNBx-TRUS Vol 70cc's, Gleason,4+3 occupying core and 50% of the Bx  material, 3+4 present in cores and occupying 30% of the Bx specimen, 4+3 present in cores and occupying 40% of the Bx specimen, 3+4 presen tin core and occupying 35% of the Bx material, Dr Wynona Luna        Social History     Social History     Socioeconomic History   ??? Marital status: DIVORCED     Spouse name: Not on file   ??? Number of children: Not on file   ??? Years of education: Not on file   ??? Highest education level: Not on file   Tobacco Use   ??? Smoking status: Never Smoker   ??? Smokeless tobacco: Never Used   Substance and Sexual Activity   ??? Alcohol use: No   ??? Drug use: No  Family History     Family History   Problem Relation Age of Onset   ??? Diabetes Mother    ??? Hypertension Mother    ??? Diabetes Sister    ??? Hypertension Sister    ??? Stroke Sister        Home Medications     Prior to Admission Medications   Prescriptions Last Dose Informant Patient Reported? Taking?   ACCU-CHEK AVIVA PLUS TEST STRP strip   Yes No   XARELTO 20 mg tab tablet   Yes No   Sig: 20 mg daily (with dinner).   acetaminophen (TYLENOL) 325 mg tablet   Yes No   Sig: Take 650 mg by mouth.   lisinopril (PRINIVIL, ZESTRIL) 5 mg tablet   Yes No   Sig: Take 5 mg by mouth daily. Indications: hypertension   metFORMIN (GLUCOPHAGE) 1,000 mg tablet   Yes No   Sig: 1,000 mg two (2) times daily (with meals).   metoprolol (LOPRESSOR) 25 mg tablet   Yes No   Sig: 25 mg daily.    raNITIdine (ZANTAC) 150 mg tablet   Yes No   Sig: 150 mg.   zolpidem (AMBIEN) 10 mg tablet   Yes No   Sig: 1/2-1 PO daily      Facility-Administered Medications: None       Allergies   No Known Allergies    Physical Exam     ED Triage Vitals   ED Encounter Vitals Group      BP 05/15/19 1902 137/87      Pulse (Heart Rate) 05/15/19 1902 (!) 102      Resp Rate 05/15/19 1902 14      Temp 05/15/19 1902 98.4 ??F (36.9 ??C)      Temp src --       O2 Sat (%) 05/15/19 1902 98 %      Weight 05/15/19 1902 200 lb      Height 05/15/19 1902 5\' 8"        Constitutional: Patient appears well developed and well nourished. Appearance and behavior are age and situation appropriate.  HEENT: Conjunctiva clear. PERRLA. Mucous membranes moist, non-erythematous. Surface of the pharynx, palate, and tongue are pink, moist and without lesions.  Neck: supple, non tender, symmetrical, no masses or JVD. No lymphadenopathy.  Respiratory: lungs clear to auscultation, nonlabored respirations. No tachypnea or accessory muscle use.  Cardiovascular: Irregular rate and rhythm without murmur rubs or gallops.    Gastrointestinal:  Abdomen soft, nondistended, nontender without complaint of pain to palpation, no CVA tenderness noted bilaterally  Musculoskeletal: Calves soft and non-tender. Distal pulses 2+ and equal bilaterally.  No peripheral edema   Integumentary: warm and dry without rashes or lesions  Neurologic: alert and oriented. No facial asymmetry or dysarthria. Moving all extremities well    Impression and Management Plan   74 y.o. male presents complaining of intermittent dizziness, lightheadedness and near syncope.  Patient is afebrile and his vital signs are hemodynamically stable.  Patient overall well-appearing, nontoxic.  He is alert and oriented x3, neurologically intact.  His physical exam is benign.  We will obtain EKG, chest x-ray and appropriate labs for further evaluation.    Diagnostic Studies   Lab:    Recent Results (from the past 12 hour(s))   CBC WITH AUTOMATED DIFF    Collection Time: 05/15/19  7:25 PM   Result Value Ref Range    WBC 7.8 4.0 - 11.0 1000/mm3    RBC 4.22 3.80 - 5.70  M/uL    HGB 12.9 12.4 - 17.2 gm/dl    HCT 39.3 37.0 - 50.0 %    MCV 93.1 80.0 - 98.0 fL    MCH 30.6 23.0 - 34.6 pg    MCHC 32.8 30.0 - 36.0 gm/dl    PLATELET 195 140 - 450 1000/mm3    MPV 10.4 (H) 6.0 - 10.0 fL    RDW-SD 43.8 35.1 - 43.9      NRBC 0 0 - 0      IMMATURE GRANULOCYTES 0.3 0.0 - 3.0 %    NEUTROPHILS 48.9 34 - 64 %    LYMPHOCYTES 37.1 28 - 48 %    MONOCYTES 7.2 1 - 13 %    EOSINOPHILS 5.7 (H) 0 - 5 %    BASOPHILS 0.8 0 - 3 %   METABOLIC PANEL, BASIC    Collection Time: 05/15/19  7:25 PM   Result Value Ref Range    Sodium 139 136 - 145 mEq/L    Potassium 4.5 3.5 - 5.1 mEq/L    Chloride 108 (H) 98 - 107 mEq/L    CO2 28 21 - 32 mEq/L    Glucose 108 (H) 74 - 106 mg/dl    BUN 17 7 - 25 mg/dl    Creatinine 0.8 0.6 - 1.3 mg/dl    GFR est AA >60      GFR est non-AA >60      Calcium 9.1 8.5 - 10.1 mg/dl    Anion gap 4 (L) 5 - 15 mmol/L   TROPONIN I    Collection Time: 05/15/19  7:25 PM   Result Value Ref Range    Troponin-I <0.015 0.000 - 0.045 ng/ml     EKG??? atrial flutter with variable AV block, incomplete right bundle branch block, prolonged QT, vent rate 106 bpm, QRS duration 104 MS, QTC 523 MS, Dr. Emeterio Reeve did not see any acute S-T segment or T-wave abnormalities that are consistent with acute ischemia or infarction.    Imaging:    Xr Chest Sngl V    Result Date: 05/15/2019  IMPRESSION: No acute cardiopulmonary process.       ED Course/Medical Decision Making   Patient remained stable throughout her stay in the ED with no new or worsening symptoms.  I have reviewed the results with the patient.  EKG shows atrial flutter however rate controlled here in the ED.  Labs unremarkable.  Chest x-ray unremarkable.  Patient still feels intermittently lightheaded especially with standing.  I feel that patient  meets criteria for admission for further cardiac evaluation and patient agreeable to this plan.  I have discussed this patient with [facility] hospitalist, Dr. Winona Legato who kindly accepts this patient for admission.  Medications - No data to display  Final Diagnosis        ICD-10-CM ICD-9-CM   1. Dizziness  R42 780.4   2. Near syncope  R55 780.2   3. Atrial flutter, unspecified type ([place])  I48.92 427.32       Disposition   Admitted      Current Discharge Medication List        The patient was fully discussed with [facility], Jeanine Luz, MD who agrees with the above assessment and plan      Ronette Deter PA-C  May 15, 2019    My signature above authenticates this document and my orders, the final ??  diagnosis (es), discharge prescription (s), and instructions in the Epic ??  record.  If you have  any questions please contact [phone].  ??  Nursing notes have been reviewed by the physician/ advanced practice ??  Clinician.      Continued by Dr. Hermina Barters dictation software was used for portions of this report. Unintended errors in transcription may occur.    I interviewed and examined the patient. I discussed with the mid-level provider agree with their evaluation and plan as documented here.      Patient is awake alert conversive  ekg shows afib/flutter no STE  CXR without acute finding  Labs reviewed  Lungs are clear heart is irregular abdomen is not tender to palpation  Patient endorses near syncope but not syncope  Patient was stable on monitor, BP ok  Moving arms and leg w/o difficulty  Patient was d/w hospitalist by [place] and admitted

## 2019-05-15 NOTE — H&P (Signed)
Hospitalist Admission History and Physical    NAME:  [name]   DOB:   September 27, 1946   MRN:   [id number]     PCP:  Maylon Peppers, MD  Admission Date/Time:  05/15/2019 10:04 PM  Anticipated Date of Discharge: 05/18/2019  Anticipated Disposition (home, SNF) : HH         Assessment/Plan:      Active Problems:    Near syncope (05/15/2019)    Near syncope  Atrial fibrillation/flutter  Uncontrolled type 2 diabetes mellitus with hyperglycemia  Hyperlipidemia      ___________________________________________________  PLAN:    Admit the patient to telemetry  Watch for any AV block, fast a flutter possibly causing the symptoms  Cardiology consult  Continue beta-blocker  Trend troponin  Check echocardiogram    EKG showed a flutter with variable AV block, prolonged QT QTc 523  I will hold flecainide dose in the morning till seen by cardiology, recheck EKG in the morning      Risk of deterioration:  [] Low    [x] Moderate  [] High    Prophylaxis:  [] Lovenox  [] Coumadin  [] Hep SQ  [] SCD???s  [] H2B/PPI[] Eliquis [x] Xarelto     Disposition:  [] Home w/ Family   [x] HH PT,OT,RN   [] SNF/LTC   [] SAH/Rehab             Subjective:   CHIEF COMPLAINT:    Chief Complaint   Patient presents with   ??? Irregular Heart Beat   ??? Dizziness       HISTORY OF PRESENT ILLNESS:     [name] is a 73 y.o. WHITE OR CAUCASIAN male who presents with dizziness, lightheadedness since 330 this afternoon  Patient this evening had a near syncope episode felt like he was going to pass out however denies losing consciousness  Denies any chest pain, shortness of breath palpitation nausea vomiting, has history of atrial fibrillation, is on flecainide the last 2 months  Patient has history of A. fib, recently moved back to this area after having gone to [place]  for a job, said few weeks ago had another episodes of A. fib, following which his flecainide was increased to twice a day  He said he intermittently gets this feeling of palpitations where he can  tell his heart rate is going fast, along with some chest discomfort but never had any chest pain  He continues to take Xarelto, beta-blocker    Past Medical History:   Diagnosis Date   ??? A-fib ([place])    ??? Benign prostatic hyperplasia with lower urinary tract symptoms    ??? BPH (benign prostatic hypertrophy) with urinary obstruction    ??? Diabetes mellitus ([place])    ??? Elevated prostate specific antigen (PSA)    ??? Enlarged prostate with lower urinary tract symptoms (LUTS)    ??? History of needle biopsy of prostate with negative result    ??? History of urinary retention    ??? Hypercholesteremia    ??? Hypertrophy of prostate with urinary obstruction and other lower urinary tract symptoms (LUTS)    ??? Prostate cancer ([place])      stage T1c Gleason 4+3 (7) adenocarcinoma of the prostate in 7/19 cores involving 30-50%, s/p MRI fusion prostate biopsy 01/20/16   ??? Urinary retention    ??? UTI (lower urinary tract infection)         Past Surgical History:   Procedure Laterality Date   ??? HX CYST REMOVAL     ??? HX PROSTATECTOMY  05/04/2016  DVP Dr Given   ??? HX UROLOGICAL  01/20/2016    PNBx-TRUS Vol 70cc's, Gleason,4+3 occupying core and 50% of the Bx  material, 3+4 present in cores and occupying 30% of the Bx specimen, 4+3 present in cores and occupying 40% of the Bx specimen, 3+4 presen tin core and occupying 35% of the Bx material, Dr Wynona Luna        Social History     Tobacco Use   ??? Smoking status: Never Smoker   ??? Smokeless tobacco: Never Used   Substance Use Topics   ??? Alcohol use: No        Family History   Problem Relation Age of Onset   ??? Diabetes Mother    ??? Hypertension Mother    ??? Diabetes Sister    ??? Hypertension Sister    ??? Stroke Sister         No Known Allergies     Prior to Admission Medications   Prescriptions Last Dose Informant Patient Reported? Taking?   ACCU-CHEK AVIVA PLUS TEST STRP strip   Yes No   XARELTO 20 mg tab tablet 05/15/2019 at Unknown time  Yes Yes   Sig: 20 mg daily (with dinner).    acetaminophen (TYLENOL) 325 mg tablet   Yes No   Sig: Take 650 mg by mouth.   lisinopril (PRINIVIL, ZESTRIL) 5 mg tablet   Yes No   Sig: Take 5 mg by mouth daily. Indications: hypertension   metFORMIN (GLUCOPHAGE) 1,000 mg tablet   Yes No   Sig: 1,000 mg two (2) times daily (with meals).   metoprolol (LOPRESSOR) 25 mg tablet   Yes No   Sig: 25 mg daily.   raNITIdine (ZANTAC) 150 mg tablet Not Taking at Unknown time  Yes No   Sig: 150 mg.   zolpidem (AMBIEN) 10 mg tablet Not Taking at Unknown time  Yes No   Sig: 1/2-1 PO daily      Facility-Administered Medications: None           REVIEW OF SYSTEMS:     []  Unable to obtain  ROS due to  [] mental status change  [] sedated   [] intubated   [x] Total of 12 systems reviewed as follows:  Constitutional: negative fever, negative chills, negative weight loss  Eyes:   negative visual changes  ENT:   negative sore throat, tongue or lip swelling  Respiratory:  negative cough, negative dyspnea  Cards:  Positive for palpitations, intermittent chest tightness  GI:   negative for nausea, vomiting, diarrhea, and abdominal pain  Genitourinary: negative for frequency, dysuria  Integument:  negative for rash and pruritus  Hematologic:  negative for easy bruising and gum/nose bleeding  Musculoskel: negative for myalgias,  back pain and muscle weakness  Neurological: Positive for dizziness, lightheadedness and near syncope  Behavl/Psych: negative for feelings of anxiety, depression       Objective:   VITALS:    Visit Vitals  BP 137/87   Pulse (!) 102   Temp 98.4 ??F (36.9 ??C)   Resp 14   Ht 5\' 8"  (1.727 m)   Wt 90.7 kg (200 lb)   SpO2 98%   BMI 30.41 kg/m??     Temp (24hrs), Avg:98.4 ??F (36.9 ??C), Min:98.4 ??F (36.9 ??C), Max:98.4 ??F (36.9 ??C)      PHYSICAL EXAM: (Seen with PPE , gloves , gown , mask n95 and goggles )  General:    Alert, cooperative, no distress, appears stated age.  Head:   Normocephalic, without obvious abnormality, atraumatic.   Eyes:   Conjunctivae clear, anicteric sclerae.  Pupils are equal  Nose:  Nares normal. No drainage or sinus tenderness.  Throat:    Lips, mucosa, and tongue normal.  No Thrush  Neck:  Supple, symmetrical,  no adenopathy, thyroid: non tender    no carotid bruit and no JVD.  Back:    Symmetric,  No CVA tenderness.  Lungs:   Clear to auscultation bilaterally.  No Wheezing or Rhonchi. No rales.  Chest wall:  No tenderness or deformity. No Accessory muscle use.  Heart:   Regular rate and rhythm,  no murmur, rub or gallop.  Abdomen:   Soft, non-tender. Not distended.  Bowel sounds normal. No masses  Extremities: Extremities normal, atraumatic, No cyanosis.  No edema. No clubbing  Skin:     Texture, turgor normal. No rashes or lesions.  Not Jaundiced  Psych:  Good insight.  Not depressed.  Not anxious or agitated.  Neurologic: EOMs intact. No facial asymmetry. No aphasia or slurred speech. Normal  strength, Alert and oriented X 3.       LAB DATA REVIEWED:    Recent Results (from the past 12 hour(s))   CBC WITH AUTOMATED DIFF    Collection Time: 05/15/19  7:25 PM   Result Value Ref Range    WBC 7.8 4.0 - 11.0 1000/mm3    RBC 4.22 3.80 - 5.70 M/uL    HGB 12.9 12.4 - 17.2 gm/dl    HCT 39.3 37.0 - 50.0 %    MCV 93.1 80.0 - 98.0 fL    MCH 30.6 23.0 - 34.6 pg    MCHC 32.8 30.0 - 36.0 gm/dl    PLATELET 195 140 - 450 1000/mm3    MPV 10.4 (H) 6.0 - 10.0 fL    RDW-SD 43.8 35.1 - 43.9      NRBC 0 0 - 0      IMMATURE GRANULOCYTES 0.3 0.0 - 3.0 %    NEUTROPHILS 48.9 34 - 64 %    LYMPHOCYTES 37.1 28 - 48 %    MONOCYTES 7.2 1 - 13 %    EOSINOPHILS 5.7 (H) 0 - 5 %    BASOPHILS 0.8 0 - 3 %   METABOLIC PANEL, BASIC    Collection Time: 05/15/19  7:25 PM   Result Value Ref Range    Sodium 139 136 - 145 mEq/L    Potassium 4.5 3.5 - 5.1 mEq/L    Chloride 108 (H) 98 - 107 mEq/L    CO2 28 21 - 32 mEq/L    Glucose 108 (H) 74 - 106 mg/dl    BUN 17 7 - 25 mg/dl    Creatinine 0.8 0.6 - 1.3 mg/dl    GFR est AA >60      GFR est non-AA >60       Calcium 9.1 8.5 - 10.1 mg/dl    Anion gap 4 (L) 5 - 15 mmol/L   TROPONIN I    Collection Time: 05/15/19  7:25 PM   Result Value Ref Range    Troponin-I <0.015 0.000 - 0.045 ng/ml         IMAGING RESULTS:    Xr Chest Sngl V    Result Date: 05/15/2019  EXAM:  Chest AP INDICATIONS: palpitations, lightheadedness, dizziness, irregular heart rate COMPARISON: Most recently 07/05/2017. FINDINGS: Chronic blunting of the left costophrenic angle, unchanged. No consolidation, pleural effusion, or pulmonary edema. No pneumothorax. Normal cardiomediastinal contours.  IMPRESSION: No acute cardiopulmonary process.       Care Plan discussed with:     [x] Patient   [] Family    [] ED Care Manager  [] ED Doc   [] Specialist :    Total care time spent on reviewing the case/data/notes/EMR,examining the patient,documentation,coordinating care with nurses and consultants is 40 minutes.       ___________________________________________________  Admitting Physician: Charlaine Dalton, MD     Dragon medical dictation software was used for portions of this report.  Unintended voice transcription errors may have occurred.

## 2019-05-16 ENCOUNTER — Inpatient Hospital Stay
Admit: 2019-05-16 | Discharge: 2019-05-19 | Disposition: A | Discharge status: Home / Self Care | Payer: MEDICARE | Attending: Internal Medicine | Admitting: Internal Medicine

## 2019-05-16 LAB — BASIC METABOLIC PANEL
Anion Gap: 4 mmol/L — ABNORMAL LOW (ref 5–15)
Anion Gap: 3 mmol/L — ABNORMAL LOW (ref 5–15)
BUN: 17 mg/dl (ref 7–25)
BUN: 15 mg/dl (ref 7–25)
CO2: 28 mEq/L (ref 21–32)
CO2: 27 mEq/L (ref 21–32)
Calcium: 9.1 mg/dl (ref 8.5–10.1)
Calcium: 8.6 mg/dl (ref 8.5–10.1)
Chloride: 108 mEq/L — ABNORMAL HIGH (ref 98–107)
Chloride: 110 mEq/L — ABNORMAL HIGH (ref 98–107)
Creatinine: 0.8 mg/dl (ref 0.6–1.3)
Creatinine: 0.7 mg/dl (ref 0.6–1.3)
EGFR IF NonAfrican American: >60
EGFR IF NonAfrican American: >60
GFR African American: >60
GFR African American: >60
Glucose: 108 mg/dl — ABNORMAL HIGH (ref 74–106)
Glucose: 102 mg/dl (ref 74–106)
Potassium: 4.5 mEq/L (ref 3.5–5.1)
Potassium: 4.1 mEq/L (ref 3.5–5.1)
Sodium: 139 mEq/L (ref 136–145)
Sodium: 139 mEq/L (ref 136–145)

## 2019-05-16 LAB — CBC WITH AUTO DIFFERENTIAL
Basophils %: 0.6 % (ref 0–3)
Basophils %: 0.8 % (ref 0–3)
Eosinophils %: 4.9 % (ref 0–5)
Eosinophils %: 5.7 % — ABNORMAL HIGH (ref 0–5)
Hematocrit: 33.9 % — ABNORMAL LOW (ref 37.0–50.0)
Hematocrit: 39.3 % (ref 37.0–50.0)
Hemoglobin: 11.4 gm/dl — ABNORMAL LOW (ref 12.4–17.2)
Hemoglobin: 12.9 gm/dl (ref 12.4–17.2)
Immature Granulocytes: 0.3 % (ref 0.0–3.0)
Immature Granulocytes: 0.3 % (ref 0.0–3.0)
Lymphocytes %: 37.1 % (ref 28–48)
Lymphocytes %: 42.5 % (ref 28–48)
MCH: 30.6 pg (ref 23.0–34.6)
MCH: 30.9 pg (ref 23.0–34.6)
MCHC: 32.8 gm/dl (ref 30.0–36.0)
MCHC: 33.6 gm/dl (ref 30.0–36.0)
MCV: 91.9 fL (ref 80.0–98.0)
MCV: 93.1 fL (ref 80.0–98.0)
MPV: 10.4 fL — ABNORMAL HIGH (ref 6.0–10.0)
MPV: 10.5 fL — ABNORMAL HIGH (ref 6.0–10.0)
Monocytes %: 7 % (ref 1–13)
Monocytes %: 7.2 % (ref 1–13)
Neutrophils %: 44.7 % (ref 34–64)
Neutrophils %: 48.9 % (ref 34–64)
Nucleated RBCs: 0 (ref 0–0)
Nucleated RBCs: 0 (ref 0–0)
Platelets: 167 10*3/uL (ref 140–450)
Platelets: 195 10*3/uL (ref 140–450)
RBC: 3.69 M/uL — ABNORMAL LOW (ref 3.80–5.70)
RBC: 4.22 M/uL (ref 3.80–5.70)
RDW-SD: 43.1 (ref 35.1–43.9)
RDW-SD: 43.8 (ref 35.1–43.9)
WBC: 7.8 10*3/uL (ref 4.0–11.0)
WBC: 8.8 10*3/uL (ref 4.0–11.0)

## 2019-05-16 LAB — POCT GLUCOSE
POC Glucose: 108 mg/dL — ABNORMAL HIGH (ref 65–105)
POC Glucose: 103 mg/dL (ref 65–105)

## 2019-05-16 LAB — TROPONIN
Troponin I: <0.015 ng/ml (ref 0.000–0.045)
Troponin I: <0.015 ng/ml (ref 0.000–0.045)
Troponin I: <0.015 ng/ml (ref 0.000–0.045)

## 2019-05-16 LAB — EKG 12-LEAD
Atrial Rate: 300 {beats}/min
Atrial Rate: 312 {beats}/min
Q-T Interval: 394 ms
Q-T Interval: 388 ms
QRS Duration: 104 ms
QRS Duration: 104 ms
QTc Calculation (Bazett): 523 ms
QTc Calculation (Bazett): 487 ms
R Axis: 76 degrees
R Axis: 60 degrees
T Axis: 86 degrees
T Axis: 79 degrees
Ventricular Rate: 106 {beats}/min
Ventricular Rate: 95 {beats}/min

## 2019-05-16 LAB — ECHOCARDIOGRAM 2D W DOPPLER W COLOR W CONTRAST: Left Ventricular Ejection Fraction: 55

## 2019-05-16 LAB — METABOLIC PANEL, BASIC
Anion gap: 4 mmol/L — ABNORMAL LOW (ref 5–15)
Anion gap: 3 mmol/L — ABNORMAL LOW (ref 5–15)
BUN: 17 mg/dl (ref 7–25)
BUN: 15 mg/dl (ref 7–25)
CO2: 28 mEq/L (ref 21–32)
CO2: 27 mEq/L (ref 21–32)
Calcium: 9.1 mg/dl (ref 8.5–10.1)
Calcium: 8.6 mg/dl (ref 8.5–10.1)
Chloride: 108 mEq/L — ABNORMAL HIGH (ref 98–107)
Chloride: 110 mEq/L — ABNORMAL HIGH (ref 98–107)
Creatinine: 0.8 mg/dl (ref 0.6–1.3)
Creatinine: 0.7 mg/dl (ref 0.6–1.3)
GFR est AA: >60
GFR est AA: >60
GFR est non-AA: >60
GFR est non-AA: >60
Glucose: 108 mg/dl — ABNORMAL HIGH (ref 74–106)
Glucose: 102 mg/dl (ref 74–106)
Potassium: 4.5 mEq/L (ref 3.5–5.1)
Potassium: 4.1 mEq/L (ref 3.5–5.1)
Sodium: 139 mEq/L (ref 136–145)
Sodium: 139 mEq/L (ref 136–145)

## 2019-05-16 LAB — CBC WITH AUTOMATED DIFF
BASOPHILS: 0.6 % (ref 0–3)
BASOPHILS: 0.8 % (ref 0–3)
EOSINOPHILS: 4.9 % (ref 0–5)
EOSINOPHILS: 5.7 % — ABNORMAL HIGH (ref 0–5)
HCT: 33.9 % — ABNORMAL LOW (ref 37.0–50.0)
HCT: 39.3 % (ref 37.0–50.0)
HGB: 11.4 gm/dl — ABNORMAL LOW (ref 12.4–17.2)
HGB: 12.9 gm/dl (ref 12.4–17.2)
IMMATURE GRANULOCYTES: 0.3 % (ref 0.0–3.0)
IMMATURE GRANULOCYTES: 0.3 % (ref 0.0–3.0)
LYMPHOCYTES: 37.1 % (ref 28–48)
LYMPHOCYTES: 42.5 % (ref 28–48)
MCH: 30.6 pg (ref 23.0–34.6)
MCH: 30.9 pg (ref 23.0–34.6)
MCHC: 32.8 gm/dl (ref 30.0–36.0)
MCHC: 33.6 gm/dl (ref 30.0–36.0)
MCV: 91.9 fL (ref 80.0–98.0)
MCV: 93.1 fL (ref 80.0–98.0)
MONOCYTES: 7 % (ref 1–13)
MONOCYTES: 7.2 % (ref 1–13)
MPV: 10.4 fL — ABNORMAL HIGH (ref 6.0–10.0)
MPV: 10.5 fL — ABNORMAL HIGH (ref 6.0–10.0)
NEUTROPHILS: 44.7 % (ref 34–64)
NEUTROPHILS: 48.9 % (ref 34–64)
NRBC: 0 (ref 0–0)
NRBC: 0 (ref 0–0)
PLATELET: 167 10*3/uL (ref 140–450)
PLATELET: 195 10*3/uL (ref 140–450)
RBC: 3.69 M/uL — ABNORMAL LOW (ref 3.80–5.70)
RBC: 4.22 M/uL (ref 3.80–5.70)
RDW-SD: 43.1 (ref 35.1–43.9)
RDW-SD: 43.8 (ref 35.1–43.9)
WBC: 7.8 10*3/uL (ref 4.0–11.0)
WBC: 8.8 10*3/uL (ref 4.0–11.0)

## 2019-05-16 LAB — EKG, 12 LEAD, INITIAL
Atrial Rate: 300 {beats}/min
Atrial Rate: 312 {beats}/min
Calculated R Axis: 76 degrees
Calculated R Axis: 60 degrees
Calculated T Axis: 86 degrees
Calculated T Axis: 79 degrees
Q-T Interval: 394 ms
Q-T Interval: 388 ms
QRS Duration: 104 ms
QRS Duration: 104 ms
QTC Calculation (Bezet): 523 ms
QTC Calculation (Bezet): 487 ms
Ventricular Rate: 106 {beats}/min
Ventricular Rate: 95 {beats}/min

## 2019-05-16 LAB — GLUCOSE, POC
Glucose (POC): 108 mg/dL — ABNORMAL HIGH (ref 65–105)
Glucose (POC): 103 mg/dL (ref 65–105)

## 2019-05-16 LAB — TROPONIN I
Troponin-I: <0.015 ng/ml (ref 0.000–0.045)
Troponin-I: <0.015 ng/ml (ref 0.000–0.045)
Troponin-I: <0.015 ng/ml (ref 0.000–0.045)

## 2019-05-16 MED ORDER — FLECAINIDE 50 MG TAB
50 mg | Freq: Two times a day (BID) | ORAL | Status: DC
Start: 2019-05-16 — End: 2019-05-16
  Administered 2019-05-16: 14:00:00 via ORAL

## 2019-05-16 MED ORDER — ACETAMINOPHEN 325 MG TABLET
325 mg | Freq: Four times a day (QID) | ORAL | Status: DC | PRN
Start: 2019-05-16 — End: 2019-05-19

## 2019-05-16 MED ORDER — METOPROLOL TARTRATE 25 MG TAB
25 mg | Freq: Two times a day (BID) | ORAL | Status: DC
Start: 2019-05-16 — End: 2019-05-16

## 2019-05-16 MED ORDER — LOSARTAN 50 MG TAB
50 mg | Freq: Every day | ORAL | Status: DC
Start: 2019-05-16 — End: 2019-05-16
  Administered 2019-05-16: 03:00:00 via ORAL

## 2019-05-16 MED ORDER — RIVAROXABAN 20 MG TAB
20 mg | Freq: Every day | ORAL | Status: DC
Start: 2019-05-16 — End: 2019-05-19
  Administered 2019-05-16 – 2019-05-19 (×4): via ORAL

## 2019-05-16 MED ORDER — FLECAINIDE 50 MG TAB
50 mg | Freq: Two times a day (BID) | ORAL | Status: DC
Start: 2019-05-16 — End: 2019-05-16

## 2019-05-16 MED ORDER — SODIUM CHLORIDE 0.9 % IJ SYRG
Freq: Three times a day (TID) | INTRAMUSCULAR | Status: DC
Start: 2019-05-16 — End: 2019-05-19
  Administered 2019-05-16 – 2019-05-19 (×12): via INTRAVENOUS

## 2019-05-16 MED ORDER — LOSARTAN 25 MG TAB
25 mg | Freq: Every day | ORAL | Status: DC
Start: 2019-05-16 — End: 2019-05-19
  Administered 2019-05-17 – 2019-05-19 (×3): via ORAL

## 2019-05-16 MED ORDER — NALOXONE 0.4 MG/ML INJECTION
0.4 mg/mL | INTRAMUSCULAR | Status: DC | PRN
Start: 2019-05-16 — End: 2019-05-19

## 2019-05-16 MED ORDER — ALUM-MAG HYDROXIDE-SIMETH 200 MG-200 MG-20 MG/5 ML ORAL SUSP
200-200-20 mg/5 mL | ORAL | Status: DC | PRN
Start: 2019-05-16 — End: 2019-05-19

## 2019-05-16 MED ORDER — LOSARTAN 50 MG TAB
50 mg | Freq: Every day | ORAL | Status: DC
Start: 2019-05-16 — End: 2019-05-16

## 2019-05-16 MED ORDER — METOPROLOL TARTRATE 25 MG TAB
25 mg | Freq: Three times a day (TID) | ORAL | Status: DC
Start: 2019-05-16 — End: 2019-05-19
  Administered 2019-05-16 – 2019-05-19 (×10): via ORAL

## 2019-05-16 MED ORDER — SIMVASTATIN 20 MG TAB
20 mg | Freq: Every evening | ORAL | Status: DC
Start: 2019-05-16 — End: 2019-05-19
  Administered 2019-05-16 – 2019-05-19 (×4): via ORAL

## 2019-05-16 MED ORDER — METOPROLOL TARTRATE 25 MG TAB
25 mg | Freq: Every day | ORAL | Status: DC
Start: 2019-05-16 — End: 2019-05-16
  Administered 2019-05-16: 13:00:00 via ORAL

## 2019-05-16 MED ORDER — PERFLUTREN LIPID MICROSPHERES 1.1 MG/ML IV
1.1 mg/mL | INTRAVENOUS | Status: AC | PRN
Start: 2019-05-16 — End: 2019-05-16
  Administered 2019-05-16: 14:00:00 via INTRAVENOUS

## 2019-05-16 MED ORDER — SODIUM CHLORIDE 0.9 % IJ SYRG
INTRAMUSCULAR | Status: DC | PRN
Start: 2019-05-16 — End: 2019-05-19
  Administered 2019-05-16: 14:00:00 via INTRAVENOUS

## 2019-05-16 MED ORDER — HYDROCODONE-ACETAMINOPHEN 5 MG-325 MG TAB
5-325 mg | ORAL | Status: DC | PRN
Start: 2019-05-16 — End: 2019-05-19

## 2019-05-16 MED ORDER — SODIUM CHLORIDE 0.9 % IJ SYRG
INTRAMUSCULAR | Status: DC | PRN
Start: 2019-05-16 — End: 2019-05-19

## 2019-05-16 MED ORDER — ZOLPIDEM 5 MG TAB
5 mg | Freq: Every evening | ORAL | Status: DC | PRN
Start: 2019-05-16 — End: 2019-05-19

## 2019-05-16 MED ORDER — MORPHINE 2 MG/ML INJECTION
2 mg/mL | INTRAMUSCULAR | Status: DC | PRN
Start: 2019-05-16 — End: 2019-05-19

## 2019-05-16 MED ORDER — LISINOPRIL 5 MG TAB
5 mg | Freq: Every day | ORAL | Status: DC
Start: 2019-05-16 — End: 2019-05-15

## 2019-05-16 MED ORDER — FLECAINIDE 50 MG TAB
50 mg | Freq: Two times a day (BID) | ORAL | Status: DC
Start: 2019-05-16 — End: 2019-05-19
  Administered 2019-05-17 – 2019-05-19 (×6): via ORAL

## 2019-05-16 MED FILL — DEFINITY 1.1 MG/ML INTRAVENOUS SUSPENSION: 1.1 mg/mL | INTRAVENOUS | Qty: 1.3

## 2019-05-16 MED FILL — LOSARTAN 25 MG TAB: 25 mg | ORAL | Qty: 1

## 2019-05-16 MED FILL — SIMVASTATIN 20 MG TAB: 20 mg | ORAL | Qty: 1

## 2019-05-16 MED FILL — FLECAINIDE 50 MG TAB: 50 mg | ORAL | Qty: 1

## 2019-05-16 MED FILL — XARELTO 20 MG TABLET: 20 mg | ORAL | Qty: 1

## 2019-05-16 MED FILL — LOSARTAN 50 MG TAB: 50 mg | ORAL | Qty: 1

## 2019-05-16 MED FILL — METOPROLOL TARTRATE 25 MG TAB: 25 mg | ORAL | Qty: 1

## 2019-05-16 NOTE — Progress Notes (Signed)
Progress  Notes by Holland Falling, MD at 05/16/19 1220                Author: Holland Falling, MD  Service: Hospitalist  Author Type: Physician       Filed: 05/18/19 1448  Date of Service: 05/16/19 1220  Status: Addendum          Editor: Holland Falling, MD (Physician)          Related Notes: Original Note by Holland Falling, MD (Physician) filed at 05/16/19 1241                       INTERNAL MEDICINE PROGRESS NOTE      Date of note:      May 16, 2019   Patient:               [name] , 73 y.o., male   Admit Date:        05/15/2019   Length of Stay:  0 day(s)        Overnight/24-hour Events:      Reviewed.     Subjective:       No acute events overnight.  Seen laying comfortably in bed.  While laying denies any lightheadedness or dizziness, chest pain, palpitations or shortness of breath but worried that  if he moves he may develop symptoms     Assessment:             Pre-syncope -orthostatic versus cardiac   Paroxysmal Atrial fibrillation/flutter   Uncontrolled type 2 diabetes   Hyperlipidemia   Hypertension   History of BPH      Plan:             ??  Awaiting cardiac evaluation recommendations: may increase medication dose vs possible DCCV tomorrow    ??  Echocardiogram completed and and complete report can be found below: EF 60%, indeterminate diastolic function, no hemodynamically significant valvular pathology, LA/RA normal in size, small PFO versus ASD suspected   ??  Resumed paroxysmal AF/flutter RC meds: On flecainide and Toprol-XL which has now been resumed.  Continue on Xarelto for [facility]   ??  Repeat EKG showing atrial flutter with variable AV block, incomplete RBBB, prolonged QT   ??  Continue on beta-blockers for hypertension.  Will restart losartan once BP stable (running 100-120s SBP)   ??  Check orthostatic vitals      Diet: Cardiac   DVT PPX: On Xarelto        Total clinical care time is 45 minutes, including review of chart including all labs, radiology, past medical history, and discussion with patient  and  family. Greater than 50% of my time was spent in coordination of care and counseling          Disposition:                 Home when medically stable     Objective:     BP 100/81    Pulse (!) 104    Temp 98.2 ??F (36.8 ??C)    Resp 19    Ht 5\' 8"  (1.727 m)    Wt 90.7 kg (200 lb)    SpO2 97%    BMI 30.41 kg/m??       General:          Alert, cooperative, no distress, appears stated age.      Head:  Normocephalic, without obvious abnormality, atraumatic.   Eyes:               Conjunctivae clear, anicteric sclerae.  Pupils are equal   Nose:               Nares normal. No drainage or sinus tenderness.   Throat:             Lips, mucosa, and tongue normal.  No Thrush   Neck:               Supple, symmetrical,  no adenopathy, thyroid: non tender                           no carotid bruit and no JVD.   Lungs:             Clear to auscultation bilaterally.  No Wheezing or Rhonchi. No rales.   Chest wall:      No tenderness or deformity. No Accessory muscle use.   Heart:              Regular rate and rhythm,  no murmur, rub or gallop.   Abdomen:        Soft, non-tender. Not distended.  Bowel sounds normal. No masses     Current Medications:        Current Facility-Administered Medications:    ?  sodium chloride (NS) flush 10-20 mL, 10-20 mL, IntraVENous, PRN, Charlaine Dalton, MD, 10 mL at 05/16/19 0945   ?  metoprolol tartrate (LOPRESSOR) tablet 25 mg, 25 mg, Oral, BID, [place], Constancia, MD   ?  flecainide (TAMBOCOR) tablet 50 mg, 50 mg, Oral, Q12H, Holland Falling, MD, 50 mg at 05/16/19 1026   ?  rivaroxaban (XARELTO) tablet 20 mg, 20 mg, Oral, DAILY WITH DINNER, Charlaine Dalton, MD   ?  zolpidem (AMBIEN) tablet 5 mg, 5 mg, Oral, QHS PRN, Charlaine Dalton, MD   ?  sodium chloride (NS) flush 5-10 mL, 5-10 mL, IntraVENous, Q8H, Charlaine Dalton, MD, 10 mL at 05/16/19 0012   ?  sodium chloride (NS) flush 5-10 mL, 5-10 mL, IntraVENous, PRN, Charlaine Dalton, MD   ?  naloxone [facility]) injection 0.1 mg, 0.1 mg,  IntraVENous, PRN, Charlaine Dalton, MD   ?  acetaminophen (TYLENOL) tablet 650 mg, 650 mg, Oral, Q6H PRN, Charlaine Dalton, MD   ?  HYDROcodone-acetaminophen (NORCO) 5-325 mg per tablet 1 Tab, 1 Tab, Oral, Q4H PRN, Charlaine Dalton, MD   ?  morphine injection 1 mg, 1 mg, IntraVENous, Q4H PRN, Charlaine Dalton, MD   ?  alum-mag hydroxide-simeth (MYLANTA) oral suspension 30 mL, 30 mL, Oral, Q4H PRN, Charlaine Dalton, MD   ?  losartan (COZAAR) tablet 25 mg, 25 mg, Oral, DAILY, Charlaine Dalton, MD, 25 mg at 05/15/19 2235   ?  simvastatin (ZOCOR) tablet 20 mg, 20 mg, Oral, QHS, Charlaine Dalton, MD, 20 mg at 05/15/19 2235      Current Outpatient Medications:    ?  metoprolol succinate (TOPROL-XL) 25 mg XL tablet, Take 1 Tab by mouth two (2) times a day., Disp: , Rfl:    ?  flecainide (TAMBOCOR) 50 mg tablet, Take 1 Tab by mouth two (2) times a day., Disp: , Rfl:    ?  losartan (COZAAR) 50 mg tablet, Take 1 Tab by mouth daily., Disp: , Rfl:    ?  simvastatin (ZOCOR) 20 mg tablet, Take 1 Tab by mouth daily., Disp: , Rfl:    ?  acetaminophen (TYLENOL) 325 mg tablet, Take 650 mg by mouth as needed., Disp: , Rfl:    ?  metFORMIN (GLUCOPHAGE) 1,000 mg tablet, 1,000 mg two (2) times daily (with meals)., Disp: , Rfl:    ?  XARELTO 20 mg tab tablet, Take 20 mg by mouth daily (with dinner)., Disp: , Rfl:      Labs:          Recent Results (from the past 24 hour(s))     EKG, 12 LEAD, INITIAL          Collection Time: 05/15/19  6:50 PM         Result  Value  Ref Range            Ventricular Rate  106  BPM       Atrial Rate  300  BPM       QRS Duration  104  ms       Q-T Interval  394  ms       QTC Calculation (Bezet)  523  ms       Calculated R Axis  76  degrees            Calculated T Axis  86  degrees            Diagnosis                 Atrial flutter with variable AV block   Incomplete right bundle branch block   Nonspecific ST abnormality   Prolonged QT   Abnormal ECG   When compared with ECG of 05-Jul-2017 03:31,   Atrial flutter has  replaced Atrial fibrillation   Nonspecific T wave abnormality no longer evident in Inferior leads   Nonspecific T wave abnormality now evident in Lateral leads   Confirmed by Haze Boyden, M.D., [place] [phone]) on 05/16/2019 8:24:21 AM          CBC WITH AUTOMATED DIFF          Collection Time: 05/15/19  7:25 PM         Result  Value  Ref Range            WBC  7.8  4.0 - 11.0 1000/mm3       RBC  4.22  3.80 - 5.70 M/uL       HGB  12.9  12.4 - 17.2 gm/dl       HCT  39.3  37.0 - 50.0 %       MCV  93.1  80.0 - 98.0 fL       MCH  30.6  23.0 - 34.6 pg       MCHC  32.8  30.0 - 36.0 gm/dl       PLATELET  195  140 - 450 1000/mm3       MPV  10.4 (H)  6.0 - 10.0 fL       RDW-SD  43.8  35.1 - 43.9         NRBC  0  0 - 0         IMMATURE GRANULOCYTES  0.3  0.0 - 3.0 %       NEUTROPHILS  48.9  34 - 64 %       LYMPHOCYTES  37.1  28 - 48 %       MONOCYTES  7.2  1 - 13 %       EOSINOPHILS  5.7 (H)  0 - 5 %  BASOPHILS  0.8  0 - 3 %       METABOLIC PANEL, BASIC          Collection Time: 05/15/19  7:25 PM         Result  Value  Ref Range            Sodium  139  136 - 145 mEq/L       Potassium  4.5  3.5 - 5.1 mEq/L       Chloride  108 (H)  98 - 107 mEq/L       CO2  28  21 - 32 mEq/L       Glucose  108 (H)  74 - 106 mg/dl       BUN  17  7 - 25 mg/dl       Creatinine  0.8  0.6 - 1.3 mg/dl       GFR est AA  >60          GFR est non-AA  >60          Calcium  9.1  8.5 - 10.1 mg/dl       Anion gap  4 (L)  5 - 15 mmol/L       TROPONIN I          Collection Time: 05/15/19  7:25 PM         Result  Value  Ref Range            Troponin-I  <0.015  0.000 - 0.045 ng/ml       TROPONIN I          Collection Time: 05/15/19 10:30 PM         Result  Value  Ref Range            Troponin-I  <0.015  0.000 - 0.045 ng/ml       GLUCOSE, POC          Collection Time: 05/16/19 12:07 AM         Result  Value  Ref Range            Glucose (POC)  108 (H)  65 - 105 mg/dL       CBC WITH AUTOMATED DIFF          Collection Time: 05/16/19  3:35 AM         Result  Value  Ref  Range            WBC  8.8  4.0 - 11.0 1000/mm3       RBC  3.69 (L)  3.80 - 5.70 M/uL       HGB  11.4 (L)  12.4 - 17.2 gm/dl       HCT  33.9 (L)  37.0 - 50.0 %       MCV  91.9  80.0 - 98.0 fL       MCH  30.9  23.0 - 34.6 pg       MCHC  33.6  30.0 - 36.0 gm/dl       PLATELET  167  140 - 450 1000/mm3       MPV  10.5 (H)  6.0 - 10.0 fL       RDW-SD  43.1  35.1 - 43.9         NRBC  0  0 - 0         IMMATURE GRANULOCYTES  0.3  0.0 - 3.0 %  NEUTROPHILS  44.7  34 - 64 %       LYMPHOCYTES  42.5  28 - 48 %       MONOCYTES  7.0  1 - 13 %       EOSINOPHILS  4.9  0 - 5 %       BASOPHILS  0.6  0 - 3 %       METABOLIC PANEL, BASIC          Collection Time: 05/16/19  3:35 AM         Result  Value  Ref Range            Sodium  139  136 - 145 mEq/L       Potassium  4.1  3.5 - 5.1 mEq/L       Chloride  110 (H)  98 - 107 mEq/L       CO2  27  21 - 32 mEq/L       Glucose  102  74 - 106 mg/dl       BUN  15  7 - 25 mg/dl       Creatinine  0.7  0.6 - 1.3 mg/dl       GFR est AA  >60          GFR est non-AA  >60          Calcium  8.6  8.5 - 10.1 mg/dl       Anion gap  3 (L)  5 - 15 mmol/L       TROPONIN I          Collection Time: 05/16/19  3:35 AM         Result  Value  Ref Range            Troponin-I  <0.015  0.000 - 0.045 ng/ml       EKG, 12 LEAD, INITIAL          Collection Time: 05/16/19  8:57 AM         Result  Value  Ref Range            Ventricular Rate  95  BPM       Atrial Rate  312  BPM       QRS Duration  104  ms       Q-T Interval  388  ms       QTC Calculation (Bezet)  487  ms       Calculated R Axis  60  degrees       Calculated T Axis  79  degrees       Diagnosis                 Atrial flutter with variable AV block   Incomplete right bundle branch block   Prolonged QT   Abnormal ECG   When compared with ECG of 15-May-2019 18:50,   No significant change was found   Confirmed by Elwyn Reach, M.D., Shanon Brow ([id number]) on 05/16/2019 11:07:14 AM             Radiology     Xr Chest Sngl V      Result Date: 05/15/2019   EXAM:  Chest AP  INDICATIONS: palpitations, lightheadedness, dizziness, irregular heart rate COMPARISON: Most recently 07/05/2017. FINDINGS: Chronic blunting of the left costophrenic angle, unchanged. No consolidation, pleural effusion, or pulmonary edema.  No pneumothorax. Normal cardiomediastinal contours.       IMPRESSION: No  acute cardiopulmonary process.        2D Echo:      Left Ventricle   The left ventricle is normal in size. There is no thrombus. There is normal   left ventricular wall thickness. Left ventricular systolic function is    normal.   The estimated left ventricular ejection fraction is 55%. Indeterminate   diastolic function. No regional wall motion abnormalities noted.   ??   Right Ventricle   The right ventricle is normal size. There is normal right ventricular wall   thickness. Right ventricular systolic function is normal: TAPSE: 2.6cm.   ??   Atria   The left atrial size is normal. Left atrial volume index: 52ml/m2. Right   atrial size is normal. Right atrial volume index 22ml/m2. 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   ??   ??   Mitral Valve   Structurally normal mitral valve. Mitral valve prolapse cannot be excluded.   There is no mitral valve stenosis. There is no mitral regurgitation noted.   ??   Tricuspid Valve   The tricuspid valve is normal. There is no tricuspid valve prolapse. There is   no tricuspid stenosis. There is trace tricuspid regurgitation. Based on the   peak tricuspid regurgitation velocity the estimated right ventricular    systolic   pressure is 29 mmHg.   ??   Aortic Valve   The aortic valve is normal in structure and function. There is no aortic   valvular vegetation. No aortic stenosis. No aortic regurgitation is present.   ??   Pulmonic Valve   The pulmonic valve leaflets are thin and pliable; valve motion is  normal.   There is no pulmonic valvular stenosis. There is no pulmonic valvular   regurgitation.   ??   Great Vessels   The aortic root is normal size.   ??   ??   Pericardium/Pleural   There is no pericardial effusion.   ??      Portions of this electronic record were dictated using Dragon voice recognition software. Unintended errors in translation may occur.         Holland Falling, MD   [facility]   May 16, 2019   12:20 PM

## 2019-05-16 NOTE — Progress Notes (Signed)
PVL-Preliminary/Carotid.    1.No evidence of stenosis noted in the internal carotid artery bilaterally.  2.Antegrade flow noted in both vertebral arteries.  3.Normal flow noted in both subclavian arteries.    Final Report To Follow    Cecilie Kicks.

## 2019-05-16 NOTE — Consults (Signed)
Consults  by Tenna Delaine, [place] at 05/16/19 1131                Author: Tenna Delaine, PA  Service: Cardiology  Author Type: Physician Assistant       Filed: 05/16/19 1222  Date of Service: 05/16/19 1131  Status: Attested Addendum          Editor: Tenna Delaine, PA (Physician Assistant)       Related Notes: Original Note by Tenna Delaine, [place] (Physician Assistant) filed at 05/16/19  1221          Cosigner: [name], MD at 05/16/19 1505          Attestation signed by [name], MD at 05/16/19 1505          [facility] Attending       Pt independently seen and examined.  Agree with attached exam and plan as formulated by Tenna Delaine except as modified below.      [name] is  a 72/M with a history of HTN, dyslipidemia, DM, former smoker, and paroxysmal AF/AFL who came in yesterday for presyncope. He stood up from sitting and suddenly felt lightheaded and  thought that he was about to pass out. Shortly thereafter he felt palpitations and he knew he was back in AF. He had his last AF presentation in 03/2019 and during that time had DCCV. He always had palpitaitons as presenting symtpom of AF however this  is the first time he had presyncope. He denies any CP, DOE, bipedal edema, PND, orthopnea.       On exam patient is not in distress. Lungs CTA B/L. Normal S1, S2, RRR, no murmurs. No bipedal edema.      EKG: Atypical atrial flutter QTc 523 --> Atypical atrial flutter 95 bpm QRS duration 104 msec  QTc 487 msec   Telemetry: AF rate 90-130's bpm      A/P:   1.  Symptomatic PAF and intermitted atypical atrial flutter with RVR- recurrence while  on low dose Flecainide. Given QRS duration only 10% increase from baseline, will increase Flecainide to a more therapeutic dose of 100 mg BID. If patient still in AF with rapid rate, will keep for DCCV on Monday (patient ate breakfast today). However  if patient asymptomatic with rates  controlled may consider outpatient DCCV. Will increase Metoprolol to 25 mg TID and decrease Losartan so as not to cause hypotension with BB uptitration. As outpatient consider changing to Tikosyn if QTc  becomes <440  msec while off Flecainide or catheter ablation.   2.  Presyncope- recommend Zio monitoring as outpatient and if normal, recommend Loop. Differentials include either rapid AF, tachy-brady syndrome,  ventricular arrhythmia given prolonged QTc.   3.     Thank you for this consult.      [name], MD   Pager [phone]                                    [facility] Consultation Note         [name] 73 y.o.   DOB: 12-28-1945   MRN: [id number]   SSN: [id number]         PCP: Maylon Peppers, MD      DOA: 05/15/2019       Reason for consult: AFL   Requesting physician: ED Physician   Outpatient cardiologist: Faylene Million  Impression:     Paroxysmal AF/AFL    H/o [place]CCV (June 2020) in [place], [place]     H/o [place] (06/2016) in [place], [place]    On Flecainide & Toprol XL    NST (Oct 2018): EF 68% with No Ischemia/Infarct    PFO    Echo (Aug 2020): EF 55% with No WMA.  NL R Heart.  No signif. Valve Dz.  RVSP 29.  Mild L > R flow (gradient 10) crossing IAS c/w Small PFO Vs. ASD   Chronic A/C (Xarelto)   HTN   HLD   Prostate CA    S/p Prostatectomy (July 2017)   DM, type 2   BPH   Former Smoker        Recommendations:     1. Paroxysmal AF/AFL.  Compliant with A/C however unable to perform DCCV (patient ate breakfast ~ 1000).  Continue Flecainide 50 BID & Lopressor 25 BID.  Consider increasing Flecainide  and/or DCCV tomorrow, defer to MD      2. Chronic A/C.  Compliant.  Continue Xarelto 20 qHS      3. PFO.  Small PFO Vs. ASD per Echo today.  Defer further Mgmt to MD      4. HTN.  Aim for SBP goal < 130.  Continue BB & Losartan 25/d      5. HLD.  Continue Zocor 20 qHS      Final Recommendations per Cardiologist       HPI:       [name] is a 73 y.o.  male who is being seen  in cardiology consultation for AFL.  Presented to ED yesterday (05/15/2019) with C/o Dizziness & Lightheadedness since earlier in the day (~ 1530), with progression to near syncope  PTA.  ED workup included labs (remarkable for K+ 4.5, Cr 0.8, Troponin neg x 3, & Glucose 108), imaging (CXR = No acute process identified), & EKG (AFL, rate 106. No acute ischemia).  Admitted along with Cardiology consulted.  Upon arrival to see patient,  A&O x 3.  Tele = AF/AFL rate low 100's.  States he recently moved back to area (was in [place], [place] & followed with Cardiologist = Dr. Domenic Schwab) a few weeks ago.  States he developed acute onset of LH/dizziness yesterday.  States in the past when  he has Afib/AFL he only has palpitations.  Denied any other associated symptoms (palpitations, dyspnea, chest pain/pressure).  Denies syncope.  States he is compliant with his cardiac meds including Xarelto, Flecainide, & Toprol XL.  Currently, asymptomatic.              Patient Active Problem List           Diagnosis  Date Noted         ?  Near syncope  05/15/2019     ?  Anxiety  10/26/2017     ?  GERD (gastroesophageal reflux disease)  10/26/2017     ?  Hypercholesterolemia  10/26/2017     ?  Atrial fibrillation ([place])  07/05/2017     ?  Chest pain  07/05/2017     ?  Elevated blood pressure reading without diagnosis of hypertension  01/15/2017     ?  Neuropathy  01/15/2017     ?  Prostate cancer ([place])  09/12/2016     ?  Anemia of chronic disease  05/13/2014     ?  Chronic fatigue  05/13/2014     ?  Controlled  type 2 diabetes mellitus without complication ([place])  16/07/9603     ?  Enlarged prostate with lower urinary tract symptoms (LUTS)  07/01/2012     ?  Urinary retention  07/01/2012         ?  UTI (lower urinary tract infection)  07/01/2012                   Past Medical History:        Diagnosis  Date         ?  A-fib ([place])       ?  Benign prostatic hyperplasia with lower urinary tract symptoms       ?  BPH (benign prostatic  hypertrophy) with urinary obstruction       ?  Diabetes mellitus ([place])       ?  Elevated prostate specific antigen (PSA)       ?  Enlarged prostate with lower urinary tract symptoms (LUTS)       ?  History of needle biopsy of prostate with negative result       ?  History of urinary retention       ?  Hypercholesteremia       ?  Hypertrophy of prostate with urinary obstruction and other lower urinary tract symptoms (LUTS)       ?  Prostate cancer [facility])             stage T1c Gleason 4+3 (7) adenocarcinoma of the prostate in 7/19 cores involving 30-50%, s/p MRI fusion prostate biopsy 01/20/16         ?  Urinary retention           ?  UTI (lower urinary tract infection)            Past Surgical History:         Procedure  Laterality  Date          ?  HX CYST REMOVAL         ?  HX PROSTATECTOMY    05/04/2016          [facility] Dr Given          ?  HX UROLOGICAL    01/20/2016          PNBx-TRUS Vol 70cc's, Gleason,4+3 occupying core and 50% of the Bx  material, 3+4 present in cores and occupying 30% of the Bx specimen, 4+3 present  in cores and occupying 40% of the Bx specimen, 3+4 presen tin core and occupying 35% of the Bx material, Dr Wynona Luna           Social History          Socioeconomic History         ?  Marital status:  DIVORCED              Spouse name:  Not on file         ?  Number of children:  Not on file     ?  Years of education:  Not on file     ?  Highest education level:  Not on file       Occupational History        ?  Not on file       Social Needs         ?  Financial resource strain:  Not on file        ?  Food insecurity  Worry:  Not on file         Inability:  Not on file        ?  Transportation needs              Medical:  Not on file         Non-medical:  Not on file       Tobacco Use         ?  Smoking status:  Never Smoker     ?  Smokeless tobacco:  Never Used       Substance and Sexual Activity         ?  Alcohol use:  No     ?  Drug use:  No     ?  Sexual activity:  Not on file        Lifestyle        ?  Physical activity              Days per week:  Not on file         Minutes per session:  Not on file         ?  Stress:  Not on file       Relationships        ?  Social Health visitor on phone:  Not on file         Gets together:  Not on file         Attends religious service:  Not on file         Active member of club or organization:  Not on file         Attends meetings of clubs or organizations:  Not on file         Relationship status:  Not on file        ?  Intimate partner violence              Fear of current or ex partner:  Not on file         Emotionally abused:  Not on file         Physically abused:  Not on file         Forced sexual activity:  Not on file        Other Topics  Concern        ?  Not on file       Social History Narrative        ?  Not on file          Family History         Problem  Relation  Age of Onset          ?  Diabetes  Mother       ?  Hypertension  Mother       ?  Diabetes  Sister       ?  Hypertension  Sister            ?  Stroke  Sister             No Known Allergies        Home Medications:          Prior to Admission medications             Medication  Sig  Start Date  End Date  Taking?  Authorizing Provider  metoprolol succinate (TOPROL-XL) 25 mg XL tablet  Take 1 Tab by mouth two (2) times a day.  03/28/19    Yes  Provider, Historical            flecainide (TAMBOCOR) 50 mg tablet  Take 1 Tab by mouth two (2) times a day.  05/06/19    Yes  Provider, Historical     losartan (COZAAR) 50 mg tablet  Take 1 Tab by mouth daily.  04/23/19    Yes  Provider, Historical     simvastatin (ZOCOR) 20 mg tablet  Take 1 Tab by mouth daily.  03/28/19    Yes  Provider, Historical     acetaminophen (TYLENOL) 325 mg tablet  Take 650 mg by mouth as needed.      Yes  Provider, Historical     metFORMIN (GLUCOPHAGE) 1,000 mg tablet  1,000 mg two (2) times daily (with meals).  07/11/16    Yes  Provider, Historical            XARELTO 20 mg tab tablet  Take 20  mg by mouth daily (with dinner).  07/17/14    Yes  Provider, Historical             Current Facility-Administered Medications          Medication  Dose  Route  Frequency           ?  metoprolol tartrate (LOPRESSOR) tablet 25 mg   25 mg  Oral  BID     ?  flecainide (TAMBOCOR) tablet 50 mg   50 mg  Oral  Q12H     ?  rivaroxaban (XARELTO) tablet 20 mg   20 mg  Oral  DAILY WITH DINNER     ?  sodium chloride (NS) flush 5-10 mL   5-10 mL  IntraVENous  Q8H     ?  losartan (COZAAR) tablet 25 mg   25 mg  Oral  DAILY           ?  simvastatin (ZOCOR) tablet 20 mg   20 mg  Oral  QHS                Review of Systems:        Positives in bold      Constitutional: Fever, chills, weight loss, weight gain, weakness, fatigue    Eyes: Blurred vision, double vision   ENT: Sore throat, congestion, headache    Respiratory: Cough, shortness of breath, wheezing   Cardiovascular: Chest pain/pressure, orthopnea, PND, edema, palpitations, syncope   Gastrointestinal: Abdominal pain, nausea, vomiting, diarrhea, constipation, melena, hematochezia, hematemesis   Genitourinary: Painful urination, frequency, urgency   Musculoskeletal: Joint pain, swelling, falls   Integumentary: Rashes, wounds   Endocrine: Heat or cold intolerance, polydipsia, polyuria   Neurological: Headache, LH/Dizziness , extremity weakness, paresthesias, presyncope, syncope    Psychiatric: Depression, anxiety, substance abuse            Physical Examination:        Visit Vitals      BP  100/81     Pulse  (!) 104     Temp  98.1 ??F (36.7 ??C)     Resp  19     Ht  5\' 8"  (1.727 m)     Wt  90.7 kg (200 lb)     SpO2  97%        BMI  30.41 kg/m??  General: NAD   HEENT: oral mucosa well perfused; conjunctiva not injected, sclera anicteric   Neck: No JVD trachea midline   Resp: Clear to auscultation bilaterally; No wheezes or rales, normal effort no accessory muscle use   Cardiovascular: regular rhythm normal s1and s2; No murmurs or rubs. Palpable DP pulses   Abd: Positive  Bowel Sounds, Soft, Nontender nondistended   Ext: No clubbing, cyanosis, no edema warm and well perfused   Neuro: Alert and oriented x 3, Nonfocal; moves all extremities   Skin: Warm, Dry, Intact      ECG: AFL, rate 106. No acute ischemia      Labs:      GFR: Estimated Creatinine Clearance: 105.8 mL/min (based on SCr of 0.7 mg/dL).         Lab Results         Component  Value  Date/Time            WBC  8.8  05/16/2019 03:35 AM       RBC  3.69 (L)  05/16/2019 03:35 AM       HGB  11.4 (L)  05/16/2019 03:35 AM       HCT  33.9 (L)  05/16/2019 03:35 AM       MCV  91.9  05/16/2019 03:35 AM       MCH  30.9  05/16/2019 03:35 AM       MCHC  33.6  05/16/2019 03:35 AM            PLT  167  05/16/2019 03:35 AM             Lab Results         Component  Value  Date/Time            GRANS  44.7  05/16/2019 03:35 AM       LYMPH  42.5  05/16/2019 03:35 AM       MONOS  7.0  05/16/2019 03:35 AM       EOS  4.9  05/16/2019 03:35 AM            BASOS  0.6  05/16/2019 03:35 AM             Lab Results         Component  Value  Date            NA  139  05/16/2019       K  4.1  05/16/2019       CL  110 (H)  05/16/2019       CO2  27  05/16/2019       BUN  15  05/16/2019       CREA  0.7  05/16/2019       GLU  102  05/16/2019       CA  8.6  05/16/2019       MG  1.8  02/05/2017            PHOS  2.5  02/05/2017              Lab Results         Component  Value  Date/Time            CK  58  07/16/2014 12:36 PM       CK - MB  1.3  07/16/2014 12:36 PM       CK-MB Index  2.2  07/16/2014 12:36 PM  Troponin-I  <0.015  05/16/2019 03:35 AM              Lab Results         Component  Value  Date/Time            TSH  2.520  07/05/2017 09:23 AM            Free T4  0.84  07/05/2017 09:23 AM            No results found for: PTP, INR, APTT, INREXT        Lab Results         Component  Value  Date/Time            Hemoglobin A1c  6.9 (H)  11/20/2017 10:51 AM               Lab Results         Component  Value  Date/Time            Cholesterol, total  176   07/06/2017 02:19 AM       HDL Cholesterol  41  07/06/2017 02:19 AM       LDL, calculated  97  07/06/2017 02:19 AM       Triglyceride  188 (H)  07/06/2017 02:19 AM            CHOL/HDL Ratio  4.3  07/06/2017 02:19 AM            No results found for: ALB, TP, CBIL, TBILI, ALT, AP, AML, LPSE      No results found for: [place], [place], [place], [place], [place], [place], [place], [place], [place], [place], [place], [place], [place], [place]   May 16, 2019, 11:31 AM   [phone]

## 2019-05-16 NOTE — ED Notes (Signed)
TRANSFER - OUT REPORT:    Verbal report given to Kat (name) on [name]  being transferred to 2227 (unit) for routine progression of care       Report consisted of patient's Situation, Background, Assessment and   Recommendations(SBAR).     Information from the following report(s) SBAR was reviewed with the receiving nurse.    Lines:   Peripheral IV 05/15/19 Right Antecubital (Active)   Site Assessment Clean, dry, & intact 05/16/19 0012   Phlebitis Assessment 0 05/16/19 0012   Infiltration Assessment 0 05/16/19 0012   Dressing Status Clean, dry, & intact 05/16/19 0012   Dressing Type Transparent;Tape 05/16/19 0012   Hub Color/Line Status Pink;Flushed;Capped 05/16/19 0012   Action Taken Blood drawn 05/15/19 1943   Alcohol Cap Used Yes 05/16/19 0012        Opportunity for questions and clarification was provided.      Patient transported with:   Registered Nurse

## 2019-05-16 NOTE — Progress Notes (Signed)
Received Pt's from ED, alert and oriented x 4. Verified tele box 71, on A-Fib, HR- 90's. TIG'R video played. Call bell within reach.

## 2019-05-16 NOTE — ED Notes (Signed)
Report given to John, RN

## 2019-05-16 NOTE — Progress Notes (Signed)
 [facility]: Medication History    Medication History Completed Prior to Order Reconciliation?  NO  If no and discrepancies were noted please contact attending physician or pharmacist to follow-up: Pharmacist SEAN informed of discrepancies.    Information obtained from (list all that apply, 2 sources preferred): Patient and Other: [facility]    If a history was not reviewed directly with patient/caregiver please comment with the reason why: N/A     Antibiotic use in the last 90 days (3 months): NO      Missing Medication Identified  YES  Number of medications: 3  Indicate action taken: Updated Medication List ZOCOR, LOSARTAN and TAMBOCOR      Wrong Medication Identified YES   Number of medications: 3  Indicate action taken: Updated Medication List ZANTAC,AMBIEN and LISINOPRIL      Wrong Dose/Interval/Route Identified YES              Number of medications: 2   Indicate action taken: Updated Medication List METOPROLOL and TYLENOL        Is patient currently taking warfarin:  No        Medication Compliance Issues and/or Medication Concerns: N/A      Allergies: Patient has no known allergies.    Prior to Admission Medications:    Prior to Admission Medications   Prescriptions Last Dose Informant Patient Reported? Taking?   XARELTO 20 mg tab tablet 05/15/2019 at 4 PM  Yes Yes   Sig: Take 20 mg by mouth daily (with dinner).   acetaminophen (TYLENOL) 325 mg tablet   Yes Yes   Sig: Take 650 mg by mouth as needed.   flecainide (TAMBOCOR) 50 mg tablet   Yes Yes   Sig: Take 1 Tab by mouth two (2) times a day.   losartan (COZAAR) 50 mg tablet   Yes Yes   Sig: Take 1 Tab by mouth daily.   metFORMIN (GLUCOPHAGE) 1,000 mg tablet   Yes Yes   Sig: 1,000 mg two (2) times daily (with meals).   metoprolol succinate (TOPROL-XL) 25 mg XL tablet   Yes Yes   Sig: Take 1 Tab by mouth two (2) times a day.   simvastatin (ZOCOR) 20 mg tablet   Yes Yes   Sig: Take 1 Tab by mouth daily.      Facility-Administered Medications: None          Alexis N. Lang, CPHT   Contact: [phone]

## 2019-05-16 NOTE — Progress Notes (Signed)
 [facility] Services: Medication History    Medication History Completed Prior to Order Reconciliation?  NO  If no and discrepancies were noted please contact attending physician or pharmacist to follow-up: Pharmacist SEAN informed of discrepancies.    Information obtained from (list all that apply, 2 sources preferred): Patient and Other: [employer]    If a history was not reviewed directly with patient/caregiver please comment with the reason why: N/A     Antibiotic use in the last 90 days (3 months): NO      Missing Medication Identified  YES  Number of medications: 3  Indicate action taken: Updated Medication List ZOCOR, LOSARTAN and TAMBOCOR      Wrong Medication Identified YES   Number of medications: 2  Indicate action taken: Updated Medication List ZANTAC and AMBIEN      Wrong Dose/Interval/Route Identified YES              Number of medications: 2   Indicate action taken: Updated Medication List METOPROLOL and TYLENOL        Is patient currently taking warfarin:  No        Medication Compliance Issues and/or Medication Concerns: N/A      Allergies: Patient has no known allergies.    Prior to Admission Medications:    Prior to Admission Medications   Prescriptions Last Dose Informant Patient Reported? Taking?   XARELTO 20 mg tab tablet 05/15/2019 at 4 PM  Yes Yes   Sig: Take 20 mg by mouth daily (with dinner).   acetaminophen (TYLENOL) 325 mg tablet   Yes Yes   Sig: Take 650 mg by mouth as needed.   flecainide (TAMBOCOR) 50 mg tablet   Yes Yes   Sig: Take 1 Tab by mouth two (2) times a day.   losartan (COZAAR) 50 mg tablet   Yes Yes   Sig: Take 1 Tab by mouth daily.   metFORMIN (GLUCOPHAGE) 1,000 mg tablet   Yes Yes   Sig: 1,000 mg two (2) times daily (with meals).   metoprolol succinate (TOPROL-XL) 25 mg XL tablet   Yes Yes   Sig: Take 1 Tab by mouth two (2) times a day.   simvastatin (ZOCOR) 20 mg tablet   Yes Yes   Sig: Take 1 Tab by mouth daily.      Facility-Administered Medications: None          Alexis N. Lang, CPHT   Contact: 2137

## 2019-05-16 NOTE — Progress Notes (Signed)
Echo with [place] completed.

## 2019-05-16 NOTE — Progress Notes (Addendum)
INTERNAL MEDICINE PROGRESS NOTE    Date of note:      May 16, 2019  Patient:               [name], 73 y.o., male  Admit Date:        05/15/2019  Length of Stay:  0 day(s)    Overnight/24-hour Events:    Reviewed.  Subjective:     No acute events overnight.  Seen laying comfortably in bed.  While laying denies any lightheadedness or dizziness, chest pain, palpitations or shortness of breath but worried that if he moves he may develop symptoms  Assessment:           Pre-syncope -orthostatic versus cardiac  Paroxysmal Atrial fibrillation/flutter  Uncontrolled type 2 diabetes  Hyperlipidemia  Hypertension  History of BPH   Plan:           ? Awaiting cardiac evaluation recommendations: may increase medication dose vs possible DCCV tomorrow   ? Echocardiogram completed and and complete report can be found below: EF 60%, indeterminate diastolic function, no hemodynamically significant valvular pathology, LA/RA normal in size, small PFO versus ASD suspected  ? Resumed paroxysmal AF/flutter RC meds: On flecainide and Toprol-XL which has now been resumed.  Continue on Xarelto for [facility]  ? Repeat EKG showing atrial flutter with variable AV block, incomplete RBBB, prolonged QT  ? Continue on beta-blockers for hypertension.  Will restart losartan once BP stable (running 100-120s SBP)  ? Check orthostatic vitals    Diet: Cardiac  DVT PPX: On Xarelto    Total clinical care time is 45 minutes, including review of chart including all labs, radiology, past medical history, and discussion with patient and family. Greater than 50% of my time was spent in coordination of care and counseling     Disposition:               Home when medically stable  Objective:   BP 100/81    Pulse (!) 104    Temp 98.2 ??F (36.8 ??C)    Resp 19    Ht 5\' 8"  (1.727 m)    Wt 90.7 kg (200 lb)    SpO2 97%    BMI 30.41 kg/m??     General:          Alert, cooperative, no distress, appears stated age.      Head:               Normocephalic, without obvious abnormality, atraumatic.  Eyes:               Conjunctivae clear, anicteric sclerae.  Pupils are equal  Nose:               Nares normal. No drainage or sinus tenderness.  Throat:             Lips, mucosa, and tongue normal.  No Thrush  Neck:               Supple, symmetrical,  no adenopathy, thyroid: non tender                          no carotid bruit and no JVD.  Lungs:             Clear to auscultation bilaterally.  No Wheezing or Rhonchi. No rales.  Chest wall:      No tenderness or deformity. No Accessory muscle use.  Heart:  Regular rate and rhythm,  no murmur, rub or gallop.  Abdomen:        Soft, non-tender. Not distended.  Bowel sounds normal. No masses  Current Medications:     Current Facility-Administered Medications:   ???  sodium chloride (NS) flush 10-20 mL, 10-20 mL, IntraVENous, PRN, Charlaine Dalton, MD, 10 mL at 05/16/19 0945  ???  metoprolol tartrate (LOPRESSOR) tablet 25 mg, 25 mg, Oral, BID, [place], Constancia, MD  ???  flecainide (TAMBOCOR) tablet 50 mg, 50 mg, Oral, Q12H, Holland Falling, MD, 50 mg at 05/16/19 1026  ???  rivaroxaban (XARELTO) tablet 20 mg, 20 mg, Oral, DAILY WITH DINNER, Charlaine Dalton, MD  ???  zolpidem (AMBIEN) tablet 5 mg, 5 mg, Oral, QHS PRN, Charlaine Dalton, MD  ???  sodium chloride (NS) flush 5-10 mL, 5-10 mL, IntraVENous, Q8H, Charlaine Dalton, MD, 10 mL at 05/16/19 0012  ???  sodium chloride (NS) flush 5-10 mL, 5-10 mL, IntraVENous, PRN, Charlaine Dalton, MD  ???  naloxone (NARCAN) injection 0.1 mg, 0.1 mg, IntraVENous, PRN, Charlaine Dalton, MD  ???  acetaminophen (TYLENOL) tablet 650 mg, 650 mg, Oral, Q6H PRN, Charlaine Dalton, MD  ???  HYDROcodone-acetaminophen (NORCO) 5-325 mg per tablet 1 Tab, 1 Tab, Oral, Q4H PRN, Charlaine Dalton, MD  ???  morphine injection 1 mg, 1 mg, IntraVENous, Q4H PRN, Charlaine Dalton, MD  ???  alum-mag hydroxide-simeth (MYLANTA) oral suspension 30 mL, 30 mL, Oral, Q4H PRN, Charlaine Dalton, MD   ???  losartan (COZAAR) tablet 25 mg, 25 mg, Oral, DAILY, Charlaine Dalton, MD, 25 mg at 05/15/19 2235  ???  simvastatin (ZOCOR) tablet 20 mg, 20 mg, Oral, QHS, Charlaine Dalton, MD, 20 mg at 05/15/19 2235    Current Outpatient Medications:   ???  metoprolol succinate (TOPROL-XL) 25 mg XL tablet, Take 1 Tab by mouth two (2) times a day., Disp: , Rfl:   ???  flecainide (TAMBOCOR) 50 mg tablet, Take 1 Tab by mouth two (2) times a day., Disp: , Rfl:   ???  losartan (COZAAR) 50 mg tablet, Take 1 Tab by mouth daily., Disp: , Rfl:   ???  simvastatin (ZOCOR) 20 mg tablet, Take 1 Tab by mouth daily., Disp: , Rfl:   ???  acetaminophen (TYLENOL) 325 mg tablet, Take 650 mg by mouth as needed., Disp: , Rfl:   ???  metFORMIN (GLUCOPHAGE) 1,000 mg tablet, 1,000 mg two (2) times daily (with meals)., Disp: , Rfl:   ???  XARELTO 20 mg tab tablet, Take 20 mg by mouth daily (with dinner)., Disp: , Rfl:   Labs:     Recent Results (from the past 24 hour(s))   EKG, 12 LEAD, INITIAL    Collection Time: 05/15/19  6:50 PM   Result Value Ref Range    Ventricular Rate 106 BPM    Atrial Rate 300 BPM    QRS Duration 104 ms    Q-T Interval 394 ms    QTC Calculation (Bezet) 523 ms    Calculated R Axis 76 degrees    Calculated T Axis 86 degrees    Diagnosis       Atrial flutter with variable AV block  Incomplete right bundle branch block  Nonspecific ST abnormality  Prolonged QT  Abnormal ECG  When compared with ECG of 05-Jul-2017 03:31,  Atrial flutter has replaced Atrial fibrillation  Nonspecific T wave abnormality no longer evident in Inferior leads  Nonspecific T wave abnormality now evident in Lateral leads  Confirmed by Haze Boyden, M.D., [place] [phone])  on 05/16/2019 8:24:21 AM     CBC WITH AUTOMATED DIFF    Collection Time: 05/15/19  7:25 PM   Result Value Ref Range    WBC 7.8 4.0 - 11.0 1000/mm3    RBC 4.22 3.80 - 5.70 M/uL    HGB 12.9 12.4 - 17.2 gm/dl    HCT 39.3 37.0 - 50.0 %    MCV 93.1 80.0 - 98.0 fL    MCH 30.6 23.0 - 34.6 pg    MCHC 32.8 30.0 - 36.0 gm/dl     PLATELET 195 140 - 450 1000/mm3    MPV 10.4 (H) 6.0 - 10.0 fL    RDW-SD 43.8 35.1 - 43.9      NRBC 0 0 - 0      IMMATURE GRANULOCYTES 0.3 0.0 - 3.0 %    NEUTROPHILS 48.9 34 - 64 %    LYMPHOCYTES 37.1 28 - 48 %    MONOCYTES 7.2 1 - 13 %    EOSINOPHILS 5.7 (H) 0 - 5 %    BASOPHILS 0.8 0 - 3 %   METABOLIC PANEL, BASIC    Collection Time: 05/15/19  7:25 PM   Result Value Ref Range    Sodium 139 136 - 145 mEq/L    Potassium 4.5 3.5 - 5.1 mEq/L    Chloride 108 (H) 98 - 107 mEq/L    CO2 28 21 - 32 mEq/L    Glucose 108 (H) 74 - 106 mg/dl    BUN 17 7 - 25 mg/dl    Creatinine 0.8 0.6 - 1.3 mg/dl    GFR est AA >60      GFR est non-AA >60      Calcium 9.1 8.5 - 10.1 mg/dl    Anion gap 4 (L) 5 - 15 mmol/L   TROPONIN I    Collection Time: 05/15/19  7:25 PM   Result Value Ref Range    Troponin-I <0.015 0.000 - 0.045 ng/ml   TROPONIN I    Collection Time: 05/15/19 10:30 PM   Result Value Ref Range    Troponin-I <0.015 0.000 - 0.045 ng/ml   GLUCOSE, POC    Collection Time: 05/16/19 12:07 AM   Result Value Ref Range    Glucose (POC) 108 (H) 65 - 105 mg/dL   CBC WITH AUTOMATED DIFF    Collection Time: 05/16/19  3:35 AM   Result Value Ref Range    WBC 8.8 4.0 - 11.0 1000/mm3    RBC 3.69 (L) 3.80 - 5.70 M/uL    HGB 11.4 (L) 12.4 - 17.2 gm/dl    HCT 33.9 (L) 37.0 - 50.0 %    MCV 91.9 80.0 - 98.0 fL    MCH 30.9 23.0 - 34.6 pg    MCHC 33.6 30.0 - 36.0 gm/dl    PLATELET 167 140 - 450 1000/mm3    MPV 10.5 (H) 6.0 - 10.0 fL    RDW-SD 43.1 35.1 - 43.9      NRBC 0 0 - 0      IMMATURE GRANULOCYTES 0.3 0.0 - 3.0 %    NEUTROPHILS 44.7 34 - 64 %    LYMPHOCYTES 42.5 28 - 48 %    MONOCYTES 7.0 1 - 13 %    EOSINOPHILS 4.9 0 - 5 %    BASOPHILS 0.6 0 - 3 %   METABOLIC PANEL, BASIC    Collection Time: 05/16/19  3:35 AM   Result Value Ref Range    Sodium 139 136 -  145 mEq/L    Potassium 4.1 3.5 - 5.1 mEq/L    Chloride 110 (H) 98 - 107 mEq/L    CO2 27 21 - 32 mEq/L    Glucose 102 74 - 106 mg/dl    BUN 15 7 - 25 mg/dl    Creatinine 0.7 0.6 - 1.3 mg/dl     GFR est AA >60      GFR est non-AA >60      Calcium 8.6 8.5 - 10.1 mg/dl    Anion gap 3 (L) 5 - 15 mmol/L   TROPONIN I    Collection Time: 05/16/19  3:35 AM   Result Value Ref Range    Troponin-I <0.015 0.000 - 0.045 ng/ml   EKG, 12 LEAD, INITIAL    Collection Time: 05/16/19  8:57 AM   Result Value Ref Range    Ventricular Rate 95 BPM    Atrial Rate 312 BPM    QRS Duration 104 ms    Q-T Interval 388 ms    QTC Calculation (Bezet) 487 ms    Calculated R Axis 60 degrees    Calculated T Axis 79 degrees    Diagnosis       Atrial flutter with variable AV block  Incomplete right bundle branch block  Prolonged QT  Abnormal ECG  When compared with ECG of 15-May-2019 18:50,  No significant change was found  Confirmed by Elwyn Reach, M.D., Shanon Brow ([id number]) on 05/16/2019 11:07:14 AM       Radiology   Xr Chest Sngl V    Result Date: 05/15/2019  EXAM:  Chest AP INDICATIONS: palpitations, lightheadedness, dizziness, irregular heart rate COMPARISON: Most recently 07/05/2017. FINDINGS: Chronic blunting of the left costophrenic angle, unchanged. No consolidation, pleural effusion, or pulmonary edema. No pneumothorax. Normal cardiomediastinal contours.     IMPRESSION: No acute cardiopulmonary process.      2D Echo:    Left Ventricle  The left ventricle is normal in size. There is no thrombus. There is normal  left ventricular wall thickness. Left ventricular systolic function is   normal.  The estimated left ventricular ejection fraction is 55%. Indeterminate  diastolic function. No regional wall motion abnormalities noted.  ??  Right Ventricle  The right ventricle is normal size. There is normal right ventricular wall  thickness. Right ventricular systolic function is normal: TAPSE: 2.6cm.  ??  Atria  The left atrial size is normal. Left atrial volume index: 38ml/m2. Right  atrial size is normal. Right atrial volume index 67ml/m2. 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  ??  ??  Mitral Valve  Structurally normal mitral valve. Mitral valve prolapse cannot be excluded.  There is no mitral valve stenosis. There is no mitral regurgitation noted.  ??  Tricuspid Valve  The tricuspid valve is normal. There is no tricuspid valve prolapse. There is  no tricuspid stenosis. There is trace tricuspid regurgitation. Based on the  peak tricuspid regurgitation velocity the estimated right ventricular   systolic  pressure is 29 mmHg.  ??  Aortic Valve  The aortic valve is normal in structure and function. There is no aortic  valvular vegetation. No aortic stenosis. No aortic regurgitation is present.  ??  Pulmonic Valve  The pulmonic valve leaflets are thin and pliable; valve motion is normal.  There is no  pulmonic valvular stenosis. There is no pulmonic valvular  regurgitation.  ??  Great Vessels  The aortic root is normal size.  ??  ??  Pericardium/Pleural  There is no pericardial effusion.  ??    Portions of this electronic record were dictated using Dragon voice recognition software. Unintended errors in translation may occur.      Holland Falling, MD  [facility]  May 16, 2019  12:20 PM

## 2019-05-16 NOTE — ED Notes (Signed)
TRANSFER - OUT REPORT:    Verbal report given to [place] (name) on [name]  being transferred to 2227 (unit) for routine progression of care       Report consisted of patient???s Situation, Background, Assessment and   Recommendations(SBAR).     Information from the following report(s) SBAR was reviewed with the receiving nurse.    Lines:   Peripheral IV 05/15/19 Right Antecubital (Active)   Site Assessment Clean, dry, & intact 05/16/19 0012   Phlebitis Assessment 0 05/16/19 0012   Infiltration Assessment 0 05/16/19 0012   Dressing Status Clean, dry, & intact 05/16/19 0012   Dressing Type Transparent;Tape 05/16/19 0012   Hub Color/Line Status Pink;Flushed;Capped 05/16/19 0012   Action Taken Blood drawn 05/15/19 1943   Alcohol Cap Used Yes 05/16/19 0012        Opportunity for questions and clarification was provided.      Patient transported with:   Registered Nurse

## 2019-05-16 NOTE — Progress Notes (Signed)
Echo with [place] completed.

## 2019-05-16 NOTE — Progress Notes (Signed)
[facility]: Medication History    Medication History Completed Prior to Order Reconciliation?  NO  If "no" and discrepancies were noted please contact attending physician or pharmacist to follow-up: Pharmacist SEAN informed of discrepancies.    Information obtained from (list all that apply, 2 sources preferred): Patient and Other: [employer]    If a history was not reviewed directly with patient/caregiver please comment with the reason why: N/A     Antibiotic use in the last 90 days (3 months): NO      Missing Medication Identified  YES  Number of medications: 3  Indicate action taken: Updated Medication List [place], LOSARTAN and TAMBOCOR      Wrong Medication Identified YES   Number of medications: 3  Indicate action taken: Updated Medication List ZANTAC,AMBIEN and LISINOPRIL      Wrong Dose/Interval/Route Identified YES              Number of medications: 2   Indicate action taken: Updated Medication List METOPROLOL and TYLENOL        Is patient currently taking warfarin:  No        Medication Compliance Issues and/or Medication Concerns: N/A      Allergies: Patient has no known allergies.    Prior to Admission Medications:    Prior to Admission Medications   Prescriptions Last Dose Informant Patient Reported? Taking?   XARELTO 20 mg tab tablet 05/15/2019 at 4 PM  Yes Yes   Sig: Take 20 mg by mouth daily (with dinner).   acetaminophen (TYLENOL) 325 mg tablet   Yes Yes   Sig: Take 650 mg by mouth as needed.   flecainide (TAMBOCOR) 50 mg tablet   Yes Yes   Sig: Take 1 Tab by mouth two (2) times a day.   losartan (COZAAR) 50 mg tablet   Yes Yes   Sig: Take 1 Tab by mouth daily.   metFORMIN (GLUCOPHAGE) 1,000 mg tablet   Yes Yes   Sig: 1,000 mg two (2) times daily (with meals).   metoprolol succinate (TOPROL-XL) 25 mg XL tablet   Yes Yes   Sig: Take 1 Tab by mouth two (2) times a day.   simvastatin (ZOCOR) 20 mg tablet   Yes Yes   Sig: Take 1 Tab by mouth daily.       Facility-Administered Medications: None         Alexis N. Quentin Cornwall, CPHT   Contact: [phone]

## 2019-05-16 NOTE — ED Notes (Signed)
Report given to John, RN

## 2019-05-16 NOTE — Other (Signed)
Pt refused bed alarm

## 2019-05-16 NOTE — Other (Signed)
Bedside and Verbal shift change report given to Collins Scotland, RN   (Soil scientist) by Mardene Celeste (offgoing nurse). Report included the following information SBAR.

## 2019-05-16 NOTE — Consults (Addendum)
[facility] Consultation Note      [name] 73 y.o.  DOB: 1946-01-04  MRN: [id number]  SSN: [id number]      PCP: Maylon Peppers, MD    DOA: 05/15/2019     Reason for consult: AFL  Requesting physician: ED Physician  Outpatient cardiologist: [facility]    Impression:   Paroxysmal AF/AFL   H/o DCCV (June 2020) in [place], [place]    H/o [place] (06/2016) in [place], [place]   On Flecainide & Toprol XL   NST (Oct 2018): EF 68% with No Ischemia/Infarct   PFO   Echo (Aug 2020): EF 55% with No WMA.  NL R Heart.  No signif. Valve Dz.  RVSP 29.  Mild L > R flow (gradient 10) crossing IAS c/w Small PFO Vs. ASD  Chronic A/C (Xarelto)  HTN  HLD  Prostate CA   S/p Prostatectomy (July 2017)  DM, type 2  BPH  Former Smoker    Recommendations:   1. Paroxysmal AF/AFL.  Compliant with A/C however unable to perform DCCV (patient ate breakfast ~ 1000).  Continue Flecainide 50 BID & Lopressor 25 BID.  Consider increasing Flecainide and/or DCCV tomorrow, defer to MD    2. Chronic A/C.  Compliant.  Continue Xarelto 20 qHS    3. PFO.  Small PFO Vs. ASD per Echo today.  Defer further Mgmt to MD    4. HTN.  Aim for SBP goal < 130.  Continue BB & Losartan 25/d    5. HLD.  Continue Zocor 20 qHS    Final Recommendations per Cardiologist     HPI:     [name] is a 73 y.o. male who is being seen in cardiology consultation for AFL.  Presented to ED yesterday (05/15/2019) with C/o Dizziness & Lightheadedness since earlier in the day (~ 1530), with progression to near syncope PTA.  ED workup included labs (remarkable for K+ 4.5, Cr 0.8, Troponin neg x 3, & Glucose 108), imaging (CXR = No acute process identified), & EKG (AFL, rate 106. No acute ischemia).  Admitted along with Cardiology consulted.  Upon arrival to see patient, A&O x 3.  Tele = AF/AFL rate low 100's.  States he recently moved back to area (was in [place], [place] & followed with Cardiologist = Dr. Domenic Schwab) a few  weeks ago.  States he developed acute onset of LH/dizziness yesterday.  States in the past when he has Afib/AFL he only has palpitations.  Denied any other associated symptoms (palpitations, dyspnea, chest pain/pressure).  Denies syncope.  States he is compliant with his cardiac meds including Xarelto, Flecainide, & Toprol XL.  Currently, asymptomatic.        Patient Active Problem List    Diagnosis Date Noted   ??? Near syncope 05/15/2019   ??? Anxiety 10/26/2017   ??? GERD (gastroesophageal reflux disease) 10/26/2017   ??? Hypercholesterolemia 10/26/2017   ??? Atrial fibrillation ([place]) 07/05/2017   ??? Chest pain 07/05/2017   ??? Elevated blood pressure reading without diagnosis of hypertension 01/15/2017   ??? Neuropathy 01/15/2017   ??? Prostate cancer ([place]) 09/12/2016   ??? Anemia of chronic disease 05/13/2014   ??? Chronic fatigue 05/13/2014   ??? Controlled type 2 diabetes mellitus without complication ([place]) 54/27/0623   ??? Enlarged prostate with lower urinary tract symptoms (LUTS) 07/01/2012   ??? Urinary retention 07/01/2012   ??? UTI (lower urinary tract infection) 07/01/2012           Past Medical History:  Diagnosis Date   ??? A-fib ([place])    ??? Benign prostatic hyperplasia with lower urinary tract symptoms    ??? BPH (benign prostatic hypertrophy) with urinary obstruction    ??? Diabetes mellitus ([place])    ??? Elevated prostate specific antigen (PSA)    ??? Enlarged prostate with lower urinary tract symptoms (LUTS)    ??? History of needle biopsy of prostate with negative result    ??? History of urinary retention    ??? Hypercholesteremia    ??? Hypertrophy of prostate with urinary obstruction and other lower urinary tract symptoms (LUTS)    ??? Prostate cancer ([place])      stage T1c Gleason 4+3 (7) adenocarcinoma of the prostate in 7/19 cores involving 30-50%, s/p MRI fusion prostate biopsy 01/20/16   ??? Urinary retention    ??? UTI (lower urinary tract infection)      Past Surgical History:   Procedure Laterality Date   ??? HX CYST REMOVAL      ??? HX PROSTATECTOMY  05/04/2016    [facility] Dr Given   ??? HX UROLOGICAL  01/20/2016    PNBx-TRUS Vol 70cc's, Gleason,4+3 occupying core and 50% of the Bx  material, 3+4 present in cores and occupying 30% of the Bx specimen, 4+3 present in cores and occupying 40% of the Bx specimen, 3+4 presen tin core and occupying 35% of the Bx material, Dr Wynona Luna      Social History     Socioeconomic History   ??? Marital status: DIVORCED     Spouse name: Not on file   ??? Number of children: Not on file   ??? Years of education: Not on file   ??? Highest education level: Not on file   Occupational History   ??? Not on file   Social Needs   ??? Financial resource strain: Not on file   ??? Food insecurity     Worry: Not on file     Inability: Not on file   ??? Transportation needs     Medical: Not on file     Non-medical: Not on file   Tobacco Use   ??? Smoking status: Never Smoker   ??? Smokeless tobacco: Never Used   Substance and Sexual Activity   ??? Alcohol use: No   ??? Drug use: No   ??? Sexual activity: Not on file   Lifestyle   ??? Physical activity     Days per week: Not on file     Minutes per session: Not on file   ??? Stress: Not on file   Relationships   ??? Social Product manager on phone: Not on file     Gets together: Not on file     Attends religious service: Not on file     Active member of club or organization: Not on file     Attends meetings of clubs or organizations: Not on file     Relationship status: Not on file   ??? Intimate partner violence     Fear of current or ex partner: Not on file     Emotionally abused: Not on file     Physically abused: Not on file     Forced sexual activity: Not on file   Other Topics Concern   ??? Not on file   Social History Narrative   ??? Not on file     Family History   Problem Relation Age of Onset   ??? Diabetes Mother    ???  Hypertension Mother    ??? Diabetes Sister    ??? Hypertension Sister    ??? Stroke Sister        No Known Allergies    Home Medications:     Prior to Admission medications     Medication Sig Start Date End Date Taking? Authorizing Provider   metoprolol succinate (TOPROL-XL) 25 mg XL tablet Take 1 Tab by mouth two (2) times a day. 03/28/19  Yes Provider, Historical   flecainide (TAMBOCOR) 50 mg tablet Take 1 Tab by mouth two (2) times a day. 05/06/19  Yes Provider, Historical   losartan (COZAAR) 50 mg tablet Take 1 Tab by mouth daily. 04/23/19  Yes Provider, Historical   simvastatin (ZOCOR) 20 mg tablet Take 1 Tab by mouth daily. 03/28/19  Yes Provider, Historical   acetaminophen (TYLENOL) 325 mg tablet Take 650 mg by mouth as needed.   Yes Provider, Historical   metFORMIN (GLUCOPHAGE) 1,000 mg tablet 1,000 mg two (2) times daily (with meals). 07/11/16  Yes Provider, Historical   XARELTO 20 mg tab tablet Take 20 mg by mouth daily (with dinner). 07/17/14  Yes Provider, Historical       Current Facility-Administered Medications   Medication Dose Route Frequency   ??? metoprolol tartrate (LOPRESSOR) tablet 25 mg  25 mg Oral BID   ??? flecainide (TAMBOCOR) tablet 50 mg  50 mg Oral Q12H   ??? rivaroxaban (XARELTO) tablet 20 mg  20 mg Oral DAILY WITH DINNER   ??? sodium chloride (NS) flush 5-10 mL  5-10 mL IntraVENous Q8H   ??? losartan (COZAAR) tablet 25 mg  25 mg Oral DAILY   ??? simvastatin (ZOCOR) tablet 20 mg  20 mg Oral QHS         Review of Systems:     Positives in bold    Constitutional: Fever, chills, weight loss, weight gain, weakness, fatigue   Eyes: Blurred vision, double vision  ENT: Sore throat, congestion, headache   Respiratory: Cough, shortness of breath, wheezing  Cardiovascular: Chest pain/pressure, orthopnea, PND, edema, palpitations, syncope  Gastrointestinal: Abdominal pain, nausea, vomiting, diarrhea, constipation, melena, hematochezia, hematemesis  Genitourinary: Painful urination, frequency, urgency  Musculoskeletal: Joint pain, swelling, falls  Integumentary: Rashes, wounds  Endocrine: Heat or cold intolerance, polydipsia, polyuria   Neurological: Headache, LH/Dizziness, extremity weakness, paresthesias, presyncope, syncope   Psychiatric: Depression, anxiety, substance abuse       Physical Examination:     Visit Vitals  BP 100/81   Pulse (!) 104   Temp 98.1 ??F (36.7 ??C)   Resp 19   Ht 5\' 8"  (1.727 m)   Wt 90.7 kg (200 lb)   SpO2 97%   BMI 30.41 kg/m??         General: NAD  HEENT: oral mucosa well perfused; conjunctiva not injected, sclera anicteric  Neck: No JVD trachea midline  Resp: Clear to auscultation bilaterally; No wheezes or rales, normal effort no accessory muscle use  Cardiovascular: regular rhythm normal s1and s2; No murmurs or rubs. Palpable DP pulses  Abd: Positive Bowel Sounds, Soft, Nontender nondistended  Ext: No clubbing, cyanosis, no edema warm and well perfused  Neuro: Alert and oriented x 3, Nonfocal; moves all extremities  Skin: Warm, Dry, Intact    ECG: AFL, rate 106. No acute ischemia    Labs:    GFR: Estimated Creatinine Clearance: 105.8 mL/min (based on SCr of 0.7 mg/dL).     Lab Results   Component Value Date/Time    WBC 8.8 05/16/2019  03:35 AM    RBC 3.69 (L) 05/16/2019 03:35 AM    HGB 11.4 (L) 05/16/2019 03:35 AM    HCT 33.9 (L) 05/16/2019 03:35 AM    MCV 91.9 05/16/2019 03:35 AM    MCH 30.9 05/16/2019 03:35 AM    MCHC 33.6 05/16/2019 03:35 AM    PLT 167 05/16/2019 03:35 AM       Lab Results   Component Value Date/Time    GRANS 44.7 05/16/2019 03:35 AM    LYMPH 42.5 05/16/2019 03:35 AM    MONOS 7.0 05/16/2019 03:35 AM    EOS 4.9 05/16/2019 03:35 AM    BASOS 0.6 05/16/2019 03:35 AM       Lab Results   Component Value Date    NA 139 05/16/2019    K 4.1 05/16/2019    CL 110 (H) 05/16/2019    CO2 27 05/16/2019    BUN 15 05/16/2019    CREA 0.7 05/16/2019    GLU 102 05/16/2019    CA 8.6 05/16/2019    MG 1.8 02/05/2017    PHOS 2.5 02/05/2017        Lab Results   Component Value Date/Time    CK 58 07/16/2014 12:36 PM    CK - MB 1.3 07/16/2014 12:36 PM    CK-MB Index 2.2 07/16/2014 12:36 PM     Troponin-I <0.015 05/16/2019 03:35 AM        Lab Results   Component Value Date/Time    TSH 2.520 07/05/2017 09:23 AM    Free T4 0.84 07/05/2017 09:23 AM        No results found for: PTP, INR, APTT, INREXT    Lab Results   Component Value Date/Time    Hemoglobin A1c 6.9 (H) 11/20/2017 10:51 AM         Lab Results   Component Value Date/Time    Cholesterol, total 176 07/06/2017 02:19 AM    HDL Cholesterol 41 07/06/2017 02:19 AM    LDL, calculated 97 07/06/2017 02:19 AM    Triglyceride 188 (H) 07/06/2017 02:19 AM    CHOL/HDL Ratio 4.3 07/06/2017 02:19 AM        No results found for: ALB, TP, CBIL, TBILI, ALT, AP, AML, LPSE    No results found for: [place], [place], [place], [place], KETU, BLDU, NITU, [place], [place], [place], [place], EPSU, [place], [place]  May 16, 2019, 11:31 AM  [phone]

## 2019-05-16 NOTE — Progress Notes (Signed)
[facility] Services: Medication History    Medication History Completed Prior to Order Reconciliation?  NO  If "no" and discrepancies were noted please contact attending physician or pharmacist to follow-up: Pharmacist SEAN informed of discrepancies.    Information obtained from (list all that apply, 2 sources preferred): Patient and Other: [employer]    If a history was not reviewed directly with patient/caregiver please comment with the reason why: N/A     Antibiotic use in the last 90 days (3 months): NO      Missing Medication Identified  YES  Number of medications: 3  Indicate action taken: Updated Medication List ZOCOR, LOSARTAN and TAMBOCOR      Wrong Medication Identified YES   Number of medications: 2  Indicate action taken: Updated Medication List ZANTAC and AMBIEN      Wrong Dose/Interval/Route Identified YES              Number of medications: 2   Indicate action taken: Updated Medication List METOPROLOL and TYLENOL        Is patient currently taking warfarin:  No        Medication Compliance Issues and/or Medication Concerns: N/A      Allergies: Patient has no known allergies.    Prior to Admission Medications:    Prior to Admission Medications   Prescriptions Last Dose Informant Patient Reported? Taking?   XARELTO 20 mg tab tablet 05/15/2019 at 4 PM  Yes Yes   Sig: Take 20 mg by mouth daily (with dinner).   acetaminophen (TYLENOL) 325 mg tablet   Yes Yes   Sig: Take 650 mg by mouth as needed.   flecainide (TAMBOCOR) 50 mg tablet   Yes Yes   Sig: Take 1 Tab by mouth two (2) times a day.   losartan (COZAAR) 50 mg tablet   Yes Yes   Sig: Take 1 Tab by mouth daily.   metFORMIN (GLUCOPHAGE) 1,000 mg tablet   Yes Yes   Sig: 1,000 mg two (2) times daily (with meals).   metoprolol succinate (TOPROL-XL) 25 mg XL tablet   Yes Yes   Sig: Take 1 Tab by mouth two (2) times a day.   simvastatin (ZOCOR) 20 mg tablet   Yes Yes   Sig: Take 1 Tab by mouth daily.      Facility-Administered Medications: None          Alexis N. Quentin Cornwall, CPHT   Contact: [phone]

## 2019-05-17 LAB — BASIC METABOLIC PANEL
Anion Gap: 1 mmol/L — ABNORMAL LOW (ref 5–15)
BUN: 13 mg/dl (ref 7–25)
CO2: 29 mEq/L (ref 21–32)
Calcium: 8.8 mg/dl (ref 8.5–10.1)
Chloride: 109 mEq/L — ABNORMAL HIGH (ref 98–107)
Creatinine: 0.8 mg/dl (ref 0.6–1.3)
EGFR IF NonAfrican American: >60
GFR African American: >60
Glucose: 121 mg/dl — ABNORMAL HIGH (ref 74–106)
Potassium: 4.7 mEq/L (ref 3.5–5.1)
Sodium: 139 mEq/L (ref 136–145)

## 2019-05-17 LAB — CBC
Hematocrit: 35.3 % — ABNORMAL LOW (ref 37.0–50.0)
Hemoglobin: 11.8 gm/dl — ABNORMAL LOW (ref 12.4–17.2)
MCH: 30.9 pg (ref 23.0–34.6)
MCHC: 33.4 gm/dl (ref 30.0–36.0)
MCV: 92.4 fL (ref 80.0–98.0)
MPV: 10.4 fL — ABNORMAL HIGH (ref 6.0–10.0)
Platelets: 173 10*3/uL (ref 140–450)
RBC: 3.82 M/uL (ref 3.80–5.70)
RDW-SD: 43.3 (ref 35.1–43.9)
WBC: 8 10*3/uL (ref 4.0–11.0)

## 2019-05-17 LAB — TROPONIN: Troponin I: <0.015 ng/ml (ref 0.000–0.045)

## 2019-05-17 LAB — POCT GLUCOSE
POC Glucose: 130 mg/dL — ABNORMAL HIGH (ref 65–105)
POC Glucose: 168 mg/dL — ABNORMAL HIGH (ref 65–105)
POC Glucose: 160 mg/dL — ABNORMAL HIGH (ref 65–105)

## 2019-05-17 LAB — CBC W/O DIFF
HCT: 35.3 % — ABNORMAL LOW (ref 37.0–50.0)
HGB: 11.8 gm/dl — ABNORMAL LOW (ref 12.4–17.2)
MCH: 30.9 pg (ref 23.0–34.6)
MCHC: 33.4 gm/dl (ref 30.0–36.0)
MCV: 92.4 fL (ref 80.0–98.0)
MPV: 10.4 fL — ABNORMAL HIGH (ref 6.0–10.0)
PLATELET: 173 10*3/uL (ref 140–450)
RBC: 3.82 M/uL (ref 3.80–5.70)
RDW-SD: 43.3 (ref 35.1–43.9)
WBC: 8 10*3/uL (ref 4.0–11.0)

## 2019-05-17 LAB — METABOLIC PANEL, BASIC
Anion gap: 1 mmol/L — ABNORMAL LOW (ref 5–15)
BUN: 13 mg/dl (ref 7–25)
CO2: 29 mEq/L (ref 21–32)
Calcium: 8.8 mg/dl (ref 8.5–10.1)
Chloride: 109 mEq/L — ABNORMAL HIGH (ref 98–107)
Creatinine: 0.8 mg/dl (ref 0.6–1.3)
GFR est AA: >60
GFR est non-AA: >60
Glucose: 121 mg/dl — ABNORMAL HIGH (ref 74–106)
Potassium: 4.7 mEq/L (ref 3.5–5.1)
Sodium: 139 mEq/L (ref 136–145)

## 2019-05-17 LAB — TROPONIN I: Troponin-I: <0.015 ng/ml (ref 0.000–0.045)

## 2019-05-17 LAB — GLUCOSE, POC
Glucose (POC): 130 mg/dL — ABNORMAL HIGH (ref 65–105)
Glucose (POC): 168 mg/dL — ABNORMAL HIGH (ref 65–105)
Glucose (POC): 160 mg/dL — ABNORMAL HIGH (ref 65–105)

## 2019-05-17 MED ORDER — INSULIN LISPRO 100 UNIT/ML INJECTION
100 unit/mL | SUBCUTANEOUS | Status: DC | PRN
Start: 2019-05-17 — End: 2019-05-19

## 2019-05-17 MED ORDER — INSULIN GLARGINE 100 UNIT/ML INJECTION
100 unit/mL | Freq: Every evening | SUBCUTANEOUS | Status: DC
Start: 2019-05-17 — End: 2019-05-19
  Administered 2019-05-18 – 2019-05-19 (×2): via SUBCUTANEOUS

## 2019-05-17 MED ORDER — DEXTROSE 50% IN WATER (D50W) IV
INTRAVENOUS | Status: DC | PRN
Start: 2019-05-17 — End: 2019-05-19

## 2019-05-17 MED ORDER — INSULIN LISPRO 100 UNIT/ML INJECTION
100 unit/mL | Freq: Four times a day (QID) | SUBCUTANEOUS | Status: DC
Start: 2019-05-17 — End: 2019-05-19
  Administered 2019-05-17 – 2019-05-19 (×6): via SUBCUTANEOUS

## 2019-05-17 MED ORDER — GLUCAGON 1 MG INJECTION
1 mg | INTRAMUSCULAR | Status: DC | PRN
Start: 2019-05-17 — End: 2019-05-19

## 2019-05-17 MED FILL — FLECAINIDE 50 MG TAB: 50 mg | ORAL | Qty: 2

## 2019-05-17 MED FILL — LOSARTAN 25 MG TAB: 25 mg | ORAL | Qty: 1

## 2019-05-17 MED FILL — SIMVASTATIN 20 MG TAB: 20 mg | ORAL | Qty: 1

## 2019-05-17 MED FILL — METOPROLOL TARTRATE 25 MG TAB: 25 mg | ORAL | Qty: 1

## 2019-05-17 MED FILL — BD POSIFLUSH NORMAL SALINE 0.9 % INJECTION SYRINGE: INTRAMUSCULAR | Qty: 10

## 2019-05-17 MED FILL — XARELTO 20 MG TABLET: 20 mg | ORAL | Qty: 1

## 2019-05-17 NOTE — Progress Notes (Signed)
Progress Notes by Sheran Luz, MD at 05/17/19 1114                Author: Sheran Luz, MD  Service: Cardiology  Author Type: Physician       Filed: 05/17/19 1132  Date of Service: 05/17/19 1114  Status: Signed          Editor: Sheran Luz, MD (Physician)                       CARDIOLOGY PROGRESS NOTE      RECS:   ??  Continue flecainide 100 mg BID and metoprolol 25 mg TID   ??  Plan for DC cardioversion on Monday.  NPO after midnight tomorrow except for sips of water for medications.   ??  Hypertension is well controlled with the low dose losartan and TID dosing of metoprolol   ??  Continue xarelto for stroke risk reduction      ASSESSMENT:   [name] is a 73 y.o.  male  With atrial fibrillation. Remains in afib today with better HR's in the 90-100's.      Paroxysmal AF/AFL               H/o DCCV (June 2020) in [place], [place]                H/o [place] (06/2016) in [place], [place]               On Flecainide & Toprol XL               NST (Oct 2018): EF 68% with No Ischemia/Infarct    PFO               Echo (Aug 2020): EF 55% with No WMA.  NL R Heart.  No signif. Valve Dz.  RVSP 29.  Mild L > R flow (gradient 10) crossing IAS c/w  Small PFO Vs. ASD   Chronic A/C (Xarelto)   Hypertension   Hyperlipidemia- on zocor   Prostate CA               S/p Prostatectomy (July 2017)   DM, type 2   BPH   Former Smoker   ??         SUBJECTIVE:   No CP or SOB. Breathing is much better today. He still feels a fluttering in his chest. He denies lightheadedness and dizziness.       VS:    Visit Vitals      BP  108/77 (BP 1 Location: Left arm, BP Patient Position: Supine)        Pulse  80     Temp  98.2 ??F (36.8 ??C)     Resp  18     Ht  5\' 8"  (1.727 m)     Wt  90.4 kg (199 lb 4.7 oz)     SpO2  97%        BMI  30.30 kg/m??          Last 3 Recorded Weights in this Encounter           05/15/19 1902  05/17/19 0431         Weight:  90.7 kg (200 lb)  90.4 kg (199 lb 4.7 oz)         Body mass index is 30.3 kg/m??.        Intake/Output Summary (Last 24 hours) at 05/17/2019 1114  Last data filed at 05/17/2019 0931     Gross per 24 hour        Intake  240 ml        Output  500 ml        Net  -260 ml           TELE personally reviewed by me: afib at 108 bpm      EXAM:   General:  No acute distress   Eyes: EOMI, No icterus   Neck: JVD is absent, No carotid bruits   Lungs: clear, no rales, no rhonchi, no wheezes. Good inspiratory effort.   Cardiac:  Irreguarly irregular, No murmur, normal s1 and s2, no s4. No gallops.   Abdomen: soft, non-tender, active bowel sounds   Ext: Edema is absent, pulses 2+    Skin:  Warm and dry   Neuro: Alert and oriented x3   Psych: Normal mood and affect      Labs:     Basic Metabolic Profile     Lab Results      Component  Value  Date/Time        NA  139  05/17/2019 07:00 AM        NA  139  05/16/2019 03:35 AM        NA  139  05/15/2019 07:25 PM        K  4.7  05/17/2019 07:00 AM        K  4.1  05/16/2019 03:35 AM        K  4.5  05/15/2019 07:25 PM        CL  109 (H)  05/17/2019 07:00 AM        CL  110 (H)  05/16/2019 03:35 AM        CL  108 (H)  05/15/2019 07:25 PM        CO2  29  05/17/2019 07:00 AM        CO2  27  05/16/2019 03:35 AM        CO2  28  05/15/2019 07:25 PM        BUN  13  05/17/2019 07:00 AM        BUN  15  05/16/2019 03:35 AM        BUN  17  05/15/2019 07:25 PM        CREA  0.8  05/17/2019 07:00 AM        CREA  0.7  05/16/2019 03:35 AM        CREA  0.8  05/15/2019 07:25 PM        GLU  121 (H)  05/17/2019 07:00 AM        GLU  102  05/16/2019 03:35 AM        GLU  108 (H)  05/15/2019 07:25 PM        CA  8.8  05/17/2019 07:00 AM        CA  8.6  05/16/2019 03:35 AM        CA  9.1  05/15/2019 07:25 PM        MG  1.8  02/05/2017 11:55 AM        MG  1.6 (L)  07/14/2014 05:14 PM        PHOS  2.5  02/05/2017 11:55 AM                   CBC w/Diff  Lab Results      Component  Value  Date/Time        WBC  8.0  05/17/2019 07:00 AM        WBC  8.8  05/16/2019 03:35 AM        WBC  7.8  05/15/2019 07:25 PM         RBC  3.82  05/17/2019 07:00 AM        RBC  3.69 (L)  05/16/2019 03:35 AM        RBC  4.22  05/15/2019 07:25 PM        HGB  11.8 (L)  05/17/2019 07:00 AM        HGB  11.4 (L)  05/16/2019 03:35 AM        HGB  12.9  05/15/2019 07:25 PM        HCT  35.3 (L)  05/17/2019 07:00 AM        HCT  33.9 (L)  05/16/2019 03:35 AM        HCT  39.3  05/15/2019 07:25 PM        MCV  92.4  05/17/2019 07:00 AM        MCV  91.9  05/16/2019 03:35 AM        MCV  93.1  05/15/2019 07:25 PM        MCH  30.9  05/17/2019 07:00 AM        MCH  30.9  05/16/2019 03:35 AM        MCH  30.6  05/15/2019 07:25 PM        MCHC  33.4  05/17/2019 07:00 AM        MCHC  33.6  05/16/2019 03:35 AM        MCHC  32.8  05/15/2019 07:25 PM        PLT  173  05/17/2019 07:00 AM        PLT  167  05/16/2019 03:35 AM        PLT  195  05/15/2019 07:25 PM           Lab Results      Component  Value  Date/Time        GRANS  44.7  05/16/2019 03:35 AM        GRANS  48.9  05/15/2019 07:25 PM        GRANS  47.8  07/05/2017 03:50 AM        LYMPH  42.5  05/16/2019 03:35 AM        LYMPH  37.1  05/15/2019 07:25 PM        LYMPH  37.4  07/05/2017 03:50 AM        MONOS  7.0  05/16/2019 03:35 AM        MONOS  7.2  05/15/2019 07:25 PM        MONOS  9.3  07/05/2017 03:50 AM        EOS  4.9  05/16/2019 03:35 AM        EOS  5.7 (H)  05/15/2019 07:25 PM        EOS  4.5  07/05/2017 03:50 AM        BASOS  0.6  05/16/2019 03:35 AM        BASOS  0.8  05/15/2019 07:25 PM        BASOS  0.6  07/05/2017 03:50 AM  Cardiac Enzymes     Lab Results      Component  Value  Date/Time        CPK  58  07/16/2014 12:36 PM        CPK  54  07/16/2014 04:03 AM        CPK  56  07/15/2014 04:14 PM        CKMB  1.3  07/16/2014 12:36 PM        CKMB  0.9  07/16/2014 04:03 AM        CKMB  0.9  07/15/2014 04:14 PM                  Coagulation       No results found for: PTP, INR, APTT, INREXT        Medications:        Current Facility-Administered Medications          Medication  Dose  Route   Frequency           ?  insulin glargine (LANTUS) injection 1-100 Units   1-100 Units  SubCUTAneous  QHS     ?  insulin lispro (HUMALOG) injection 1-100 Units   1-100 Units  SubCUTAneous  AC&HS     ?  flecainide (TAMBOCOR) tablet 100 mg   100 mg  Oral  Q12H     ?  losartan (COZAAR) tablet 12.5 mg   12.5 mg  Oral  DAILY     ?  metoprolol tartrate (LOPRESSOR) tablet 25 mg   25 mg  Oral  TID     ?  rivaroxaban (XARELTO) tablet 20 mg   20 mg  Oral  DAILY WITH DINNER     ?  sodium chloride (NS) flush 5-10 mL   5-10 mL  IntraVENous  Q8H           ?  simvastatin (ZOCOR) tablet 20 mg   20 mg  Oral  QHS               Sheran Luz, MD   [facility]   [facility]         May 17, 2019   11:14 AM

## 2019-05-17 NOTE — Progress Notes (Signed)
Problem: Falls - Risk of  Goal: *Absence of Falls  Description: Document Schmid Fall Risk and appropriate interventions in the flowsheet.  Outcome: Progressing Towards Goal  Note: Fall Risk Interventions:            Medication Interventions: Bed/chair exit alarm, Evaluate medications/consider consulting pharmacy, Patient to call before getting OOB, Teach patient to arise slowly                   Problem: Patient Education: Go to Patient Education Activity  Goal: Patient/Family Education  Outcome: Progressing Towards Goal

## 2019-05-17 NOTE — Progress Notes (Signed)
Progress  Notes by Holland Falling, MD at 05/17/19 1541                Author: Holland Falling, MD  Service: Hospitalist  Author Type: Physician       Filed: 05/18/19 1447  Date of Service: 05/17/19 1541  Status: Addendum          Editor: Holland Falling, MD (Physician)          Related Notes: Original Note by Holland Falling, MD (Physician) filed at 05/17/19 1544                       INTERNAL MEDICINE PROGRESS NOTE      Date of note:      May 17, 2019   Patient:               [name] , 73 y.o., male   Admit Date:        05/15/2019   Length of Stay:  0 day(s)        Overnight/24-hour Events:      Reviewed.     Subjective:       No acute events overnight.  Seen laying comfortably in bed.  While laying denies any lightheadedness or dizziness, chest pain, palpitations or shortness of breath but worried that  if he moves he may develop symptoms     Assessment:             Pre-syncope -orthostatic versus cardiac   Paroxysmal Atrial fibrillation/flutter   Uncontrolled type 2 diabetes   Hyperlipidemia   Hypertension   History of BPH      Plan:             ??  Plans for DCCV on Monday 8-10 as per cardiology.  Will keep patient n.p.o. after midnight   ??   Echocardiogram completed and and complete report can be found below: EF 60%, indeterminate diastolic function, no hemodynamically significant valvular pathology, LA/RA normal in size, small PFO versus ASD suspected   ??  Resumed paroxysmal AF/flutter RC meds: On flecainide and Toprol-XL which has now been resumed.  Continue on Xarelto for [facility]   ??  Repeat EKG showing atrial flutter with variable AV block, incomplete RBBB, prolonged QT   ??  Continue on beta-blockers for hypertension.  Will restart losartan once BP stable (running 100-120s SBP)   ??  Check orthostatic vitals   ??  Glucomander protocol started as metformin has been held and patient is concerned about glucose levels.  Of note glucose ranging around 180      Diet: Cardiac   DVT PPX: On Xarelto        Total clinical  care time is 40 minutes, including review of chart including all labs, radiology, past medical history, and discussion with patient and  family. Greater than 50% of my time was spent in coordination of care and counseling          Disposition:                 Home when medically stable     Objective:     BP 97/59 (BP 1 Location: Left arm, BP Patient Position: Supine)    Pulse 84    Temp 98.6 ??F (37 ??C)    Resp 17    Ht 5\' 8"  (1.727 m)    Wt 90.4 kg (199  lb 4.7 oz)    SpO2 96%    BMI  30.30 kg/m??       General:          Alert, cooperative, no distress, appears stated age.      Head:               Normocephalic, without obvious abnormality, atraumatic.   Eyes:               Conjunctivae clear, anicteric sclerae.  Pupils are equal   Nose:               Nares normal. No drainage or sinus tenderness.   Throat:             Lips, mucosa, and tongue normal.  No Thrush   Neck:               Supple, symmetrical,  no adenopathy, thyroid: non tender                           no carotid bruit and no JVD.   Lungs:             Clear to auscultation bilaterally.  No Wheezing or Rhonchi. No rales.   Chest wall:      No tenderness or deformity. No Accessory muscle use.   Heart:              Regular rate and rhythm,  no murmur, rub or gallop.   Abdomen:        Soft, non-tender. Not distended.  Bowel sounds normal. No masses     Current Medications:        Current Facility-Administered Medications:    ?  dextrose (D50) infusion 5-25 g, 10-50 mL, IntraVENous, PRN, Holland Falling, MD   ?  glucagon (GLUCAGEN) injection 1 mg, 1 mg, IntraMUSCular, PRN, Holland Falling, MD   ?  insulin glargine (LANTUS) injection 1-100 Units, 1-100 Units, SubCUTAneous, QHS, Holland Falling, MD   ?  insulin lispro (HUMALOG) injection 1-100 Units, 1-100 Units, SubCUTAneous, AC&HS, Holland Falling, MD   ?  insulin lispro (HUMALOG) injection 1-100 Units, 1-100 Units, SubCUTAneous, PRN, Holland Falling, MD   ?  sodium chloride (NS) flush 10-20 mL, 10-20 mL, IntraVENous, PRN, Charlaine Dalton, MD, 10 mL at 05/16/19 0945   ?  flecainide (TAMBOCOR) tablet 100 mg, 100 mg, Oral, Q12H, Macatangay-Geronilla, Constancia, MD, 100 mg at 05/17/19 0920   ?  losartan (COZAAR) tablet 12.5 mg, 12.5 mg, Oral, DAILY, Macatangay-Geronilla, Constancia, MD, 12.5 mg at 05/17/19 0920   ?  metoprolol tartrate (LOPRESSOR) tablet 25 mg, 25 mg, Oral, TID, Macatangay-Geronilla, Constancia, MD, 25 mg at 05/17/19 0919   ?  rivaroxaban (XARELTO) tablet 20 mg, 20 mg, Oral, DAILY WITH DINNER, Charlaine Dalton, MD, 20 mg at 05/16/19 1849   ?  zolpidem (AMBIEN) tablet 5 mg, 5 mg, Oral, QHS PRN, Charlaine Dalton, MD   ?  sodium chloride (NS) flush 5-10 mL, 5-10 mL, IntraVENous, Q8H, Charlaine Dalton, MD, 10 mL at 05/17/19 5409   ?  sodium chloride (NS) flush 5-10 mL, 5-10 mL, IntraVENous, PRN, Charlaine Dalton, MD   ?  naloxone [facility]) injection 0.1 mg, 0.1 mg, IntraVENous, PRN, Charlaine Dalton, MD   ?  acetaminophen (TYLENOL) tablet 650 mg, 650 mg, Oral, Q6H PRN, Charlaine Dalton, MD   ?  HYDROcodone-acetaminophen (NORCO) 5-325 mg per tablet 1 Tab, 1 Tab, Oral, Q4H PRN, Charlaine Dalton, MD   ?  morphine injection 1 mg, 1 mg, IntraVENous, Q4H PRN,  Charlaine Dalton, MD   ?  alum-mag hydroxide-simeth [facility]) oral suspension 30 mL, 30 mL, Oral, Q4H PRN, Charlaine Dalton, MD   ?  simvastatin (ZOCOR) tablet 20 mg, 20 mg, Oral, QHS, Charlaine Dalton, MD, 20 mg at 05/16/19 2215     Labs:          Recent Results (from the past 24 hour(s))     GLUCOSE, POC          Collection Time: 05/16/19  6:07 PM         Result  Value  Ref Range            Glucose (POC)  103  65 - 105 mg/dL       TROPONIN I          Collection Time: 05/16/19  7:10 PM         Result  Value  Ref Range            Troponin-I  <0.015  0.000 - 0.045 ng/ml       CBC W/O DIFF          Collection Time: 05/17/19  7:00 AM         Result  Value  Ref Range            WBC  8.0  4.0 - 11.0 1000/mm3       RBC  3.82  3.80 - 5.70 M/uL       HGB  11.8 (L)  12.4 - 17.2 gm/dl       HCT  35.3 (L)  37.0 -  50.0 %       MCV  92.4  80.0 - 98.0 fL       MCH  30.9  23.0 - 34.6 pg       MCHC  33.4  30.0 - 36.0 gm/dl       PLATELET  173  140 - 450 1000/mm3       MPV  10.4 (H)  6.0 - 10.0 fL       RDW-SD  43.3  35.1 - 16.0         METABOLIC PANEL, BASIC          Collection Time: 05/17/19  7:00 AM         Result  Value  Ref Range            Sodium  139  136 - 145 mEq/L       Potassium  4.7  3.5 - 5.1 mEq/L       Chloride  109 (H)  98 - 107 mEq/L       CO2  29  21 - 32 mEq/L       Glucose  121 (H)  74 - 106 mg/dl       BUN  13  7 - 25 mg/dl       Creatinine  0.8  0.6 - 1.3 mg/dl       GFR est AA  >60          GFR est non-AA  >60          Calcium  8.8  8.5 - 10.1 mg/dl       Anion gap  1 (L)  5 - 15 mmol/L       GLUCOSE, POC          Collection Time: 05/17/19  7:47 AM         Result  Value  Ref  Range            Glucose (POC)  130 (H)  65 - 105 mg/dL       GLUCOSE, POC          Collection Time: 05/17/19 12:24 PM         Result  Value  Ref Range            Glucose (POC)  168 (H)  65 - 105 mg/dL          Radiology     No results found.    2D Echo:      Left Ventricle   The left ventricle is normal in size. There is no thrombus. There is normal   left ventricular wall thickness. Left ventricular systolic function is    normal.   The estimated left ventricular ejection fraction is 55%. Indeterminate   diastolic function. No regional wall motion abnormalities noted.   ??   Right Ventricle   The right ventricle is normal size. There is normal right ventricular wall   thickness. Right ventricular systolic function is normal: TAPSE: 2.6cm.   ??   Atria   The left atrial size is normal. Left atrial volume index: 43ml/m2. Right   atrial size is normal. Right atrial volume index 68ml/m2. Mild left-to-right   flow, with a gradient measured at 44mmhg, seen crossing the interatrial    septum   at the level of the fossa ovalis consistent with the presence of a small   patent foramen ovalve (PFO) vs ASD.   ??   ??   Mitral Valve   Structurally  normal mitral valve. Mitral valve prolapse cannot be excluded.   There is no mitral valve stenosis. There is no mitral regurgitation noted.   ??   Tricuspid Valve   The tricuspid valve is normal. There is no tricuspid valve prolapse. There is   no tricuspid stenosis. There is trace tricuspid regurgitation. Based on the   peak tricuspid regurgitation velocity the estimated right ventricular    systolic   pressure is 29 mmHg.   ??   Aortic Valve   The aortic valve is normal in structure and function. There is no aortic   valvular vegetation. No aortic stenosis. No aortic regurgitation is present.   ??   Pulmonic Valve   The pulmonic valve leaflets are thin and pliable; valve motion is normal.   There is no pulmonic valvular stenosis. There is no pulmonic valvular   regurgitation.   ??   Great Vessels   The aortic root is normal size.   ??   ??   Pericardium/Pleural   There is no pericardial effusion.   ??      Portions of this electronic record were dictated using Dragon voice recognition software. Unintended errors in translation may occur.         Holland Falling, MD   [facility]   May 17, 2019   12:20 PM

## 2019-05-17 NOTE — Other (Signed)
Received awake watching tv, no c/o  No request voiced when asked, call bel in reach

## 2019-05-17 NOTE — Progress Notes (Addendum)
INTERNAL MEDICINE PROGRESS NOTE    Date of note:      May 17, 2019  Patient:               [name], 73 y.o., male  Admit Date:        05/15/2019  Length of Stay:  0 day(s)    Overnight/24-hour Events:    Reviewed.  Subjective:     No acute events overnight.  Seen laying comfortably in bed.  While laying denies any lightheadedness or dizziness, chest pain, palpitations or shortness of breath but worried that if he moves he may develop symptoms  Assessment:           Pre-syncope -orthostatic versus cardiac  Paroxysmal Atrial fibrillation/flutter  Uncontrolled type 2 diabetes  Hyperlipidemia  Hypertension  History of BPH   Plan:           ? Plans for DCCV on Monday 8???10 as per cardiology.  Will keep patient n.p.o. after midnight  ?  Echocardiogram completed and and complete report can be found below: EF 60%, indeterminate diastolic function, no hemodynamically significant valvular pathology, LA/RA normal in size, small PFO versus ASD suspected  ? Resumed paroxysmal AF/flutter RC meds: On flecainide and Toprol-XL which has now been resumed.  Continue on Xarelto for [facility]  ? Repeat EKG showing atrial flutter with variable AV block, incomplete RBBB, prolonged QT  ? Continue on beta-blockers for hypertension.  Will restart losartan once BP stable (running 100-120s SBP)  ? Check orthostatic vitals  ? Glucomander protocol started as metformin has been held and patient is concerned about glucose levels.  Of note glucose ranging around 180    Diet: Cardiac  DVT PPX: On Xarelto    Total clinical care time is 40 minutes, including review of chart including all labs, radiology, past medical history, and discussion with patient and family. Greater than 50% of my time was spent in coordination of care and counseling     Disposition:               Home when medically stable  Objective:   BP 97/59 (BP 1 Location: Left arm, BP Patient Position: Supine)    Pulse  84    Temp 98.6 ??F (37 ??C)    Resp 17    Ht 5\' 8"  (1.727 m)    Wt 90.4 kg (199 lb 4.7 oz)    SpO2 96%    BMI 30.30 kg/m??     General:          Alert, cooperative, no distress, appears stated age.     Head:               Normocephalic, without obvious abnormality, atraumatic.  Eyes:               Conjunctivae clear, anicteric sclerae.  Pupils are equal  Nose:               Nares normal. No drainage or sinus tenderness.  Throat:             Lips, mucosa, and tongue normal.  No Thrush  Neck:               Supple, symmetrical,  no adenopathy, thyroid: non tender                          no carotid bruit and no JVD.  Lungs:  Clear to auscultation bilaterally.  No Wheezing or Rhonchi. No rales.  Chest wall:      No tenderness or deformity. No Accessory muscle use.  Heart:              Regular rate and rhythm,  no murmur, rub or gallop.  Abdomen:        Soft, non-tender. Not distended.  Bowel sounds normal. No masses  Current Medications:     Current Facility-Administered Medications:   ???  dextrose (D50) infusion 5-25 g, 10-50 mL, IntraVENous, PRN, Holland Falling, MD  ???  glucagon (GLUCAGEN) injection 1 mg, 1 mg, IntraMUSCular, PRN, Holland Falling, MD  ???  insulin glargine (LANTUS) injection 1-100 Units, 1-100 Units, SubCUTAneous, QHS, Holland Falling, MD  ???  insulin lispro (HUMALOG) injection 1-100 Units, 1-100 Units, SubCUTAneous, AC&HS, Holland Falling, MD  ???  insulin lispro (HUMALOG) injection 1-100 Units, 1-100 Units, SubCUTAneous, PRN, Holland Falling, MD  ???  sodium chloride (NS) flush 10-20 mL, 10-20 mL, IntraVENous, PRN, Charlaine Dalton, MD, 10 mL at 05/16/19 0945  ???  flecainide (TAMBOCOR) tablet 100 mg, 100 mg, Oral, Q12H, Macatangay-Geronilla, Constancia, MD, 100 mg at 05/17/19 0920  ???  losartan (COZAAR) tablet 12.5 mg, 12.5 mg, Oral, DAILY, Macatangay-Geronilla, Constancia, MD, 12.5 mg at 05/17/19 0920  ???  metoprolol tartrate (LOPRESSOR) tablet 25 mg, 25 mg, Oral, TID,  Macatangay-Geronilla, Constancia, MD, 25 mg at 05/17/19 0919  ???  rivaroxaban (XARELTO) tablet 20 mg, 20 mg, Oral, DAILY WITH DINNER, Charlaine Dalton, MD, 20 mg at 05/16/19 1849  ???  zolpidem (AMBIEN) tablet 5 mg, 5 mg, Oral, QHS PRN, Charlaine Dalton, MD  ???  sodium chloride (NS) flush 5-10 mL, 5-10 mL, IntraVENous, Q8H, Charlaine Dalton, MD, 10 mL at 05/17/19 0511  ???  sodium chloride (NS) flush 5-10 mL, 5-10 mL, IntraVENous, PRN, Charlaine Dalton, MD  ???  naloxone (NARCAN) injection 0.1 mg, 0.1 mg, IntraVENous, PRN, Charlaine Dalton, MD  ???  acetaminophen (TYLENOL) tablet 650 mg, 650 mg, Oral, Q6H PRN, Charlaine Dalton, MD  ???  HYDROcodone-acetaminophen (NORCO) 5-325 mg per tablet 1 Tab, 1 Tab, Oral, Q4H PRN, Charlaine Dalton, MD  ???  morphine injection 1 mg, 1 mg, IntraVENous, Q4H PRN, Charlaine Dalton, MD  ???  alum-mag hydroxide-simeth (MYLANTA) oral suspension 30 mL, 30 mL, Oral, Q4H PRN, Charlaine Dalton, MD  ???  simvastatin (ZOCOR) tablet 20 mg, 20 mg, Oral, QHS, Charlaine Dalton, MD, 20 mg at 05/16/19 2215  Labs:     Recent Results (from the past 24 hour(s))   GLUCOSE, POC    Collection Time: 05/16/19  6:07 PM   Result Value Ref Range    Glucose (POC) 103 65 - 105 mg/dL   TROPONIN I    Collection Time: 05/16/19  7:10 PM   Result Value Ref Range    Troponin-I <0.015 0.000 - 0.045 ng/ml   CBC W/O DIFF    Collection Time: 05/17/19  7:00 AM   Result Value Ref Range    WBC 8.0 4.0 - 11.0 1000/mm3    RBC 3.82 3.80 - 5.70 M/uL    HGB 11.8 (L) 12.4 - 17.2 gm/dl    HCT 35.3 (L) 37.0 - 50.0 %    MCV 92.4 80.0 - 98.0 fL    MCH 30.9 23.0 - 34.6 pg    MCHC 33.4 30.0 - 36.0 gm/dl    PLATELET 173 140 - 450 1000/mm3    MPV 10.4 (H) 6.0 - 10.0 fL    RDW-SD 43.3  35.1 - 13.0     METABOLIC PANEL, BASIC    Collection Time: 05/17/19  7:00 AM   Result Value Ref Range    Sodium 139 136 - 145 mEq/L    Potassium 4.7 3.5 - 5.1 mEq/L    Chloride 109 (H) 98 - 107 mEq/L    CO2 29 21 - 32 mEq/L    Glucose 121 (H) 74 - 106 mg/dl    BUN 13 7 - 25 mg/dl     Creatinine 0.8 0.6 - 1.3 mg/dl    GFR est AA >60      GFR est non-AA >60      Calcium 8.8 8.5 - 10.1 mg/dl    Anion gap 1 (L) 5 - 15 mmol/L   GLUCOSE, POC    Collection Time: 05/17/19  7:47 AM   Result Value Ref Range    Glucose (POC) 130 (H) 65 - 105 mg/dL   GLUCOSE, POC    Collection Time: 05/17/19 12:24 PM   Result Value Ref Range    Glucose (POC) 168 (H) 65 - 105 mg/dL     Radiology   No results found.   2D Echo:    Left Ventricle  The left ventricle is normal in size. There is no thrombus. There is normal  left ventricular wall thickness. Left ventricular systolic function is   normal.  The estimated left ventricular ejection fraction is 55%. Indeterminate  diastolic function. No regional wall motion abnormalities noted.  ??  Right Ventricle  The right ventricle is normal size. There is normal right ventricular wall  thickness. Right ventricular systolic function is normal: TAPSE: 2.6cm.  ??  Atria  The left atrial size is normal. Left atrial volume index: 35ml/m2. Right  atrial size is normal. Right atrial volume index 34ml/m2. Mild left-to-right  flow, with a gradient measured at 1mmhg, seen crossing the interatrial   septum  at the level of the fossa ovalis consistent with the presence of a small  patent foramen ovalve (PFO) vs ASD.  ??  ??  Mitral Valve  Structurally normal mitral valve. Mitral valve prolapse cannot be excluded.  There is no mitral valve stenosis. There is no mitral regurgitation noted.  ??  Tricuspid Valve  The tricuspid valve is normal. There is no tricuspid valve prolapse. There is  no tricuspid stenosis. There is trace tricuspid regurgitation. Based on the  peak tricuspid regurgitation velocity the estimated right ventricular   systolic  pressure is 29 mmHg.  ??  Aortic Valve  The aortic valve is normal in structure and function. There is no aortic  valvular vegetation. No aortic stenosis. No aortic regurgitation is present.  ??  Pulmonic Valve   The pulmonic valve leaflets are thin and pliable; valve motion is normal.  There is no pulmonic valvular stenosis. There is no pulmonic valvular  regurgitation.  ??  Great Vessels  The aortic root is normal size.  ??  ??  Pericardium/Pleural  There is no pericardial effusion.  ??    Portions of this electronic record were dictated using Dragon voice recognition software. Unintended errors in translation may occur.      Holland Falling, MD  [facility]  May 17, 2019  12:20 PM

## 2019-05-17 NOTE — Progress Notes (Signed)
CARDIOLOGY PROGRESS NOTE    RECS:  ?? Continue flecainide 100 mg BID and metoprolol 25 mg TID  ?? Plan for DC cardioversion on Monday.  NPO after midnight tomorrow except for sips of water for medications.  ?? Hypertension is well controlled with the low dose losartan and TID dosing of metoprolol  ?? Continue xarelto for stroke risk reduction    ASSESSMENT:  [name] is a 73 y.o. male  With atrial fibrillation. Remains in afib today with better HR's in the 90-100's.    Paroxysmal AF/AFL              H/o DCCV (June 2020) in [place], [place]               H/o [facility] (06/2016) in [place], [place]              On Flecainide & Toprol XL              NST (Oct 2018): EF 68% with No Ischemia/Infarct   PFO              Echo (Aug 2020): EF 55% with No WMA.  NL R Heart.  No signif. Valve Dz.  RVSP 29.  Mild L > R flow (gradient 10) crossing IAS c/w  Small PFO Vs. 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      SUBJECTIVE:  No CP or SOB. Breathing is much better today. He still feels a fluttering in his chest. He denies lightheadedness and dizziness.     VS:   Visit Vitals  BP 108/77 (BP 1 Location: Left arm, BP Patient Position: Supine)   Pulse 80   Temp 98.2 ??F (36.8 ??C)   Resp 18   Ht 5\' 8"  (1.727 m)   Wt 90.4 kg (199 lb 4.7 oz)   SpO2 97%   BMI 30.30 kg/m??     Last 3 Recorded Weights in this Encounter    05/15/19 1902 05/17/19 0431   Weight: 90.7 kg (200 lb) 90.4 kg (199 lb 4.7 oz)      Body mass index is 30.3 kg/m??.     Intake/Output Summary (Last 24 hours) at 05/17/2019 1114  Last data filed at 05/17/2019 0931  Gross per 24 hour   Intake 240 ml   Output 500 ml   Net -260 ml       TELE personally reviewed by me: afib at 108 bpm    EXAM:  General:  No acute distress  Eyes: EOMI, No icterus  Neck: JVD is absent, No carotid bruits  Lungs: clear, no rales, no rhonchi, no wheezes. Good inspiratory effort.   Cardiac:  Irreguarly irregular, No murmur, normal s1 and s2, no s4. No gallops.  Abdomen: soft, non-tender, active bowel sounds  Ext: Edema is absent, pulses 2+   Skin:  Warm and dry  Neuro: Alert and oriented x3  Psych: Normal mood and affect    Labs:  Basic Metabolic Profile   Lab Results   Component Value Date/Time    NA 139 05/17/2019 07:00 AM    NA 139 05/16/2019 03:35 AM    NA 139 05/15/2019 07:25 PM    K 4.7 05/17/2019 07:00 AM    K 4.1 05/16/2019 03:35 AM    K 4.5 05/15/2019  07:25 PM    CL 109 (H) 05/17/2019 07:00 AM    CL 110 (H) 05/16/2019 03:35 AM    CL 108 (H) 05/15/2019 07:25 PM    CO2 29 05/17/2019 07:00 AM    CO2 27 05/16/2019 03:35 AM    CO2 28 05/15/2019 07:25 PM    BUN 13 05/17/2019 07:00 AM    BUN 15 05/16/2019 03:35 AM    BUN 17 05/15/2019 07:25 PM    CREA 0.8 05/17/2019 07:00 AM    CREA 0.7 05/16/2019 03:35 AM    CREA 0.8 05/15/2019 07:25 PM    GLU 121 (H) 05/17/2019 07:00 AM    GLU 102 05/16/2019 03:35 AM    GLU 108 (H) 05/15/2019 07:25 PM    CA 8.8 05/17/2019 07:00 AM    CA 8.6 05/16/2019 03:35 AM    CA 9.1 05/15/2019 07:25 PM    MG 1.8 02/05/2017 11:55 AM    MG 1.6 (L) 07/14/2014 05:14 PM    PHOS 2.5 02/05/2017 11:55 AM        CBC w/Diff    Lab Results   Component Value Date/Time    WBC 8.0 05/17/2019 07:00 AM    WBC 8.8 05/16/2019 03:35 AM    WBC 7.8 05/15/2019 07:25 PM    RBC 3.82 05/17/2019 07:00 AM    RBC 3.69 (L) 05/16/2019 03:35 AM    RBC 4.22 05/15/2019 07:25 PM    HGB 11.8 (L) 05/17/2019 07:00 AM    HGB 11.4 (L) 05/16/2019 03:35 AM    HGB 12.9 05/15/2019 07:25 PM    HCT 35.3 (L) 05/17/2019 07:00 AM    HCT 33.9 (L) 05/16/2019 03:35 AM    HCT 39.3 05/15/2019 07:25 PM    MCV 92.4 05/17/2019 07:00 AM    MCV 91.9 05/16/2019 03:35 AM    MCV 93.1 05/15/2019 07:25 PM    MCH 30.9 05/17/2019 07:00 AM    MCH 30.9 05/16/2019 03:35 AM    MCH 30.6 05/15/2019 07:25 PM    MCHC 33.4 05/17/2019 07:00 AM    MCHC 33.6 05/16/2019 03:35 AM    MCHC 32.8 05/15/2019 07:25 PM    PLT 173 05/17/2019 07:00 AM     PLT 167 05/16/2019 03:35 AM    PLT 195 05/15/2019 07:25 PM    Lab Results   Component Value Date/Time    GRANS 44.7 05/16/2019 03:35 AM    GRANS 48.9 05/15/2019 07:25 PM    GRANS 47.8 07/05/2017 03:50 AM    LYMPH 42.5 05/16/2019 03:35 AM    LYMPH 37.1 05/15/2019 07:25 PM    LYMPH 37.4 07/05/2017 03:50 AM    MONOS 7.0 05/16/2019 03:35 AM    MONOS 7.2 05/15/2019 07:25 PM    MONOS 9.3 07/05/2017 03:50 AM    EOS 4.9 05/16/2019 03:35 AM    EOS 5.7 (H) 05/15/2019 07:25 PM    EOS 4.5 07/05/2017 03:50 AM    BASOS 0.6 05/16/2019 03:35 AM    BASOS 0.8 05/15/2019 07:25 PM    BASOS 0.6 07/05/2017 03:50 AM        Cardiac Enzymes   Lab Results   Component Value Date/Time    CPK 58 07/16/2014 12:36 PM    CPK 54 07/16/2014 04:03 AM    CPK 56 07/15/2014 04:14 PM    CKMB 1.3 07/16/2014 12:36 PM    CKMB 0.9 07/16/2014 04:03 AM    CKMB 0.9 07/15/2014 04:14 PM        Coagulation  No results found for: PTP, INR, APTT, INREXT     Medications:    Current Facility-Administered Medications   Medication Dose Route Frequency   ??? insulin glargine (LANTUS) injection 1-100 Units  1-100 Units SubCUTAneous QHS   ??? insulin lispro (HUMALOG) injection 1-100 Units  1-100 Units SubCUTAneous AC&HS   ??? flecainide (TAMBOCOR) tablet 100 mg  100 mg Oral Q12H   ??? losartan (COZAAR) tablet 12.5 mg  12.5 mg Oral DAILY   ??? metoprolol tartrate (LOPRESSOR) tablet 25 mg  25 mg Oral TID   ??? rivaroxaban (XARELTO) tablet 20 mg  20 mg Oral DAILY WITH DINNER   ??? sodium chloride (NS) flush 5-10 mL  5-10 mL IntraVENous Q8H   ??? simvastatin (ZOCOR) tablet 20 mg  20 mg Oral QHS          Sheran Luz, MD  [facility]      May 17, 2019  11:14 AM

## 2019-05-18 LAB — POCT GLUCOSE
POC Glucose: 153 mg/dL — ABNORMAL HIGH (ref 65–105)
POC Glucose: 142 mg/dL — ABNORMAL HIGH (ref 65–105)
POC Glucose: 135 mg/dL — ABNORMAL HIGH (ref 65–105)
POC Glucose: 132 mg/dL — ABNORMAL HIGH (ref 65–105)

## 2019-05-18 LAB — EKG 12-LEAD
Atrial Rate: 312 {beats}/min
Q-T Interval: 392 ms
QRS Duration: 104 ms
QTc Calculation (Bazett): 482 ms
R Axis: 78 degrees
T Axis: 89 degrees
Ventricular Rate: 91 {beats}/min

## 2019-05-18 LAB — BASIC METABOLIC PANEL
Anion Gap: 2 mmol/L — ABNORMAL LOW (ref 5–15)
BUN: 19 mg/dl (ref 7–25)
CO2: 28 mEq/L (ref 21–32)
Calcium: 8.8 mg/dl (ref 8.5–10.1)
Chloride: 111 mEq/L — ABNORMAL HIGH (ref 98–107)
Creatinine: 0.8 mg/dl (ref 0.6–1.3)
EGFR IF NonAfrican American: >60
GFR African American: >60
Glucose: 116 mg/dl — ABNORMAL HIGH (ref 74–106)
Potassium: 4.4 mEq/L (ref 3.5–5.1)
Sodium: 141 mEq/L (ref 136–145)

## 2019-05-18 LAB — CBC
Hematocrit: 36.3 % — ABNORMAL LOW (ref 37.0–50.0)
Hemoglobin: 12 gm/dl — ABNORMAL LOW (ref 12.4–17.2)
MCH: 30.7 pg (ref 23.0–34.6)
MCHC: 33.1 gm/dl (ref 30.0–36.0)
MCV: 92.8 fL (ref 80.0–98.0)
MPV: 10.3 fL — ABNORMAL HIGH (ref 6.0–10.0)
Platelets: 180 10*3/uL (ref 140–450)
RBC: 3.91 M/uL (ref 3.80–5.70)
RDW-SD: 43.1 (ref 35.1–43.9)
WBC: 7.6 10*3/uL (ref 4.0–11.0)

## 2019-05-18 LAB — METABOLIC PANEL, BASIC
Anion gap: 2 mmol/L — ABNORMAL LOW (ref 5–15)
BUN: 19 mg/dl (ref 7–25)
CO2: 28 mEq/L (ref 21–32)
Calcium: 8.8 mg/dl (ref 8.5–10.1)
Chloride: 111 mEq/L — ABNORMAL HIGH (ref 98–107)
Creatinine: 0.8 mg/dl (ref 0.6–1.3)
GFR est AA: >60
GFR est non-AA: >60
Glucose: 116 mg/dl — ABNORMAL HIGH (ref 74–106)
Potassium: 4.4 mEq/L (ref 3.5–5.1)
Sodium: 141 mEq/L (ref 136–145)

## 2019-05-18 LAB — EKG, 12 LEAD, INITIAL
Atrial Rate: 312 {beats}/min
Calculated R Axis: 78 degrees
Calculated T Axis: 89 degrees
Q-T Interval: 392 ms
QRS Duration: 104 ms
QTC Calculation (Bezet): 482 ms
Ventricular Rate: 91 {beats}/min

## 2019-05-18 LAB — GLUCOSE, POC
Glucose (POC): 153 mg/dL — ABNORMAL HIGH (ref 65–105)
Glucose (POC): 142 mg/dL — ABNORMAL HIGH (ref 65–105)
Glucose (POC): 135 mg/dL — ABNORMAL HIGH (ref 65–105)
Glucose (POC): 132 mg/dL — ABNORMAL HIGH (ref 65–105)

## 2019-05-18 LAB — CBC W/O DIFF
HCT: 36.3 % — ABNORMAL LOW (ref 37.0–50.0)
HGB: 12 gm/dl — ABNORMAL LOW (ref 12.4–17.2)
MCH: 30.7 pg (ref 23.0–34.6)
MCHC: 33.1 gm/dl (ref 30.0–36.0)
MCV: 92.8 fL (ref 80.0–98.0)
MPV: 10.3 fL — ABNORMAL HIGH (ref 6.0–10.0)
PLATELET: 180 10*3/uL (ref 140–450)
RBC: 3.91 M/uL (ref 3.80–5.70)
RDW-SD: 43.1 (ref 35.1–43.9)
WBC: 7.6 10*3/uL (ref 4.0–11.0)

## 2019-05-18 MED FILL — METOPROLOL TARTRATE 25 MG TAB: 25 mg | ORAL | Qty: 1

## 2019-05-18 MED FILL — FLECAINIDE 50 MG TAB: 50 mg | ORAL | Qty: 2

## 2019-05-18 MED FILL — LOSARTAN 25 MG TAB: 25 mg | ORAL | Qty: 1

## 2019-05-18 MED FILL — SIMVASTATIN 20 MG TAB: 20 mg | ORAL | Qty: 1

## 2019-05-18 MED FILL — XARELTO 20 MG TABLET: 20 mg | ORAL | Qty: 1

## 2019-05-18 NOTE — Progress Notes (Signed)
Progress Notes by Sheran Luz, MD at 05/18/19 1053                Author: Sheran Luz, MD  Service: Cardiology  Author Type: Physician       Filed: 05/18/19 1058  Date of Service: 05/18/19 1053  Status: Signed          Editor: Sheran Luz, MD (Physician)                       CARDIOLOGY PROGRESS NOTE      RECS:   ??  Continue flecainide 100 mg BID and metoprolol 25 mg TID   ??  Plan for DC cardioversion on tomorrow.  NPO after midnight tomorrow except for sips of water for medications.   ??  Hypertension is well controlled with the low dose losartan and TID dosing of metoprolol   ??  Continue xarelto for stroke risk reduction   ??  zio at outpt for presyncope evaluation to assure no tachy-brady events or ventricular arrhythmias.      ASSESSMENT:   [name] is a 73 y.o.  male  With afib   Remains in afib today with HR's in the 90-100's.   ??   Paroxysmal AF/AFL   ????????????????????????H/o DCCV (June 2020) in [place], [place]??   ????????????????????????H/o DCCV (06/2016) in [place], [place]   ????????????????????????On Flecainide & Toprol XL   ????????????????????????NST (Oct 2018): EF 68% with No Ischemia/Infarct    PFO   ????????????????????????Echo (Aug 2020): EF 55% with No WMA. ??NL R Heart. ??No signif. Valve Dz. ??RVSP 29. 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   Hyperlipidemia- on zocor   Prostate CA   ????????????????????????S/p Prostatectomy (July 2017)   DM, type 2   BPH   Former Smoker         Active Problems:     Near syncope (05/15/2019)            SUBJECTIVE:   No CP or SOB. Breathing is much good. He is able to lay supine in bed.  He still feels a fluttering in his chest. He denies lightheadedness and dizziness.       VS:    Visit Vitals      BP  113/72 (BP 1 Location: Left arm, BP Patient Position: Supine)        Pulse  77     Temp  98.4 ??F (36.9 ??C)     Resp  16     Ht  5\' 8"  (1.727 m)     Wt  91.8 kg (202 lb 6.1 oz)     SpO2  98%        BMI  30.77 kg/m??           Last 3 Recorded Weights in this Encounter            05/15/19 1902  05/17/19 0431  05/18/19 0706          Weight:  90.7 kg (200 lb)  90.4 kg (199 lb 4.7 oz)  91.8 kg (202 lb 6.1 oz)         Body mass index is 30.77 kg/m??.    No intake or output data in the 24 hours ending 05/18/19 1053      TELE personally reviewed by me: afib at 90 bpm  EXAM:   General:  No acute distress   Eyes: EOMI, No icterus   Neck: JVD is absent, No carotid bruits   Lungs: clear, no rales, no rhonchi, no wheezes. Good inspiratory effort.   Cardiac:  Irreguarly irregular, No murmur, normal s1 and s2, no s4. No gallops.   Abdomen: soft, non-tender, active bowel sounds   Ext: Edema is absent, pulses 2+    Skin:  Warm and dry   Neuro: Alert and oriented x3   Psych: Normal mood and affect       Labs:     Basic Metabolic Profile     Lab Results      Component  Value  Date/Time        NA  141  05/18/2019 05:22 AM        NA  139  05/17/2019 07:00 AM        NA  139  05/16/2019 03:35 AM        K  4.4  05/18/2019 05:22 AM        K  4.7  05/17/2019 07:00 AM        K  4.1  05/16/2019 03:35 AM        CL  111 (H)  05/18/2019 05:22 AM        CL  109 (H)  05/17/2019 07:00 AM        CL  110 (H)  05/16/2019 03:35 AM        CO2  28  05/18/2019 05:22 AM        CO2  29  05/17/2019 07:00 AM        CO2  27  05/16/2019 03:35 AM        BUN  19  05/18/2019 05:22 AM        BUN  13  05/17/2019 07:00 AM        BUN  15  05/16/2019 03:35 AM        CREA  0.8  05/18/2019 05:22 AM        CREA  0.8  05/17/2019 07:00 AM        CREA  0.7  05/16/2019 03:35 AM        GLU  116 (H)  05/18/2019 05:22 AM        GLU  121 (H)  05/17/2019 07:00 AM        GLU  102  05/16/2019 03:35 AM        CA  8.8  05/18/2019 05:22 AM        CA  8.8  05/17/2019 07:00 AM        CA  8.6  05/16/2019 03:35 AM        MG  1.8  02/05/2017 11:55 AM        MG  1.6 (L)  07/14/2014 05:14 PM        PHOS  2.5  02/05/2017 11:55 AM                   CBC w/Diff       Lab Results      Component  Value   Date/Time        WBC  7.6  05/18/2019 05:22 AM        WBC  8.0  05/17/2019 07:00 AM        WBC  8.8  05/16/2019 03:35 AM        RBC  3.91  05/18/2019 05:22 AM  RBC  3.82  05/17/2019 07:00 AM        RBC  3.69 (L)  05/16/2019 03:35 AM        HGB  12.0 (L)  05/18/2019 05:22 AM        HGB  11.8 (L)  05/17/2019 07:00 AM        HGB  11.4 (L)  05/16/2019 03:35 AM        HCT  36.3 (L)  05/18/2019 05:22 AM        HCT  35.3 (L)  05/17/2019 07:00 AM        HCT  33.9 (L)  05/16/2019 03:35 AM        MCV  92.8  05/18/2019 05:22 AM        MCV  92.4  05/17/2019 07:00 AM        MCV  91.9  05/16/2019 03:35 AM        MCH  30.7  05/18/2019 05:22 AM        MCH  30.9  05/17/2019 07:00 AM        MCH  30.9  05/16/2019 03:35 AM        MCHC  33.1  05/18/2019 05:22 AM        MCHC  33.4  05/17/2019 07:00 AM        MCHC  33.6  05/16/2019 03:35 AM        PLT  180  05/18/2019 05:22 AM        PLT  173  05/17/2019 07:00 AM        PLT  167  05/16/2019 03:35 AM           Lab Results      Component  Value  Date/Time        GRANS  44.7  05/16/2019 03:35 AM        GRANS  48.9  05/15/2019 07:25 PM        GRANS  47.8  07/05/2017 03:50 AM        LYMPH  42.5  05/16/2019 03:35 AM        LYMPH  37.1  05/15/2019 07:25 PM        LYMPH  37.4  07/05/2017 03:50 AM        MONOS  7.0  05/16/2019 03:35 AM        MONOS  7.2  05/15/2019 07:25 PM        MONOS  9.3  07/05/2017 03:50 AM        EOS  4.9  05/16/2019 03:35 AM        EOS  5.7 (H)  05/15/2019 07:25 PM        EOS  4.5  07/05/2017 03:50 AM        BASOS  0.6  05/16/2019 03:35 AM        BASOS  0.8  05/15/2019 07:25 PM        BASOS  0.6  07/05/2017 03:50 AM                   Cardiac Enzymes     Lab Results      Component  Value  Date/Time        CPK  58  07/16/2014 12:36 PM        CPK  54  07/16/2014 04:03 AM        CPK  56  07/15/2014 04:14 PM        CKMB  1.3  07/16/2014 12:36 PM        CKMB  0.9  07/16/2014 04:03 AM        CKMB  0.9  07/15/2014 04:14 PM                  Coagulation       No results found  for: PTP, INR, APTT, INREXT        Medications:        Current Facility-Administered Medications          Medication  Dose  Route  Frequency           ?  insulin glargine (LANTUS) injection 1-100 Units   1-100 Units  SubCUTAneous  QHS     ?  insulin lispro (HUMALOG) injection 1-100 Units   1-100 Units  SubCUTAneous  AC&HS     ?  flecainide (TAMBOCOR) tablet 100 mg   100 mg  Oral  Q12H     ?  losartan (COZAAR) tablet 12.5 mg   12.5 mg  Oral  DAILY     ?  metoprolol tartrate (LOPRESSOR) tablet 25 mg   25 mg  Oral  TID     ?  rivaroxaban (XARELTO) tablet 20 mg   20 mg  Oral  DAILY WITH DINNER     ?  sodium chloride (NS) flush 5-10 mL   5-10 mL  IntraVENous  Q8H           ?  simvastatin (ZOCOR) tablet 20 mg   20 mg  Oral  QHS               Sheran Luz, MD   [facility]   [facility]         May 18, 2019   10:53 AM

## 2019-05-18 NOTE — Progress Notes (Signed)
Progress  Notes by Holland Falling, MD at 05/18/19 1443                Author: Holland Falling, MD  Service: Hospitalist  Author Type: Physician       Filed: 05/18/19 1447  Date of Service: 05/18/19 1443  Status: Signed          Editor: Holland Falling, MD (Physician)                       INTERNAL MEDICINE PROGRESS NOTE      Date of note:      May 18, 2019   Patient:               [name] , 73 y.o., male   Admit Date:        05/15/2019   Length of Stay:  1 day(s)        Overnight/24-hour Events:      Reviewed.     Subjective:       No acute events overnight.  Seen laying comfortably in bed.  Will be n.p.o. after midnight     Assessment:             Pre-syncope -most likely from paroxysmal A. fib with RVR consequent hypotension/orthostasis   Paroxysmal Atrial fibrillation/flutter   Uncontrolled type 2 diabetes   Hyperlipidemia   Hypertension   History of BPH      Plan:             ??  Plans for DCCV on Monday 8-10 as per cardiology.  Will keep patient n.p.o. after midnight   ??  Resumed paroxysmal AF/flutter RC meds: On flecainide and Toprol-XL which has now been resumed.  Continue on Xarelto for [facility]   ??  Repeat EKG showing atrial flutter with variable AV block, incomplete RBBB, prolonged QT   ??  Continue on beta-blockers for hypertension.  Losartan reduced by 50% as per cards. BP stable    ??  Glucomander protocol started as metformin has been held and patient is concerned about glucose levels.        Diet: Cardiac / NPO after midnight   DVT PPX: On Xarelto        Total clinical care time is 35 minutes, including review of chart including all labs, radiology, past medical history, and discussion with patient and  family. Greater than 50% of my time was spent in coordination of care and counseling          Disposition:                 Home when medically stable     Objective:     BP 111/64 (BP 1 Location: Left arm, BP Patient Position: Sitting)    Pulse 98    Temp 98.4 ??F (36.9 ??C)    Resp 20    Ht 5\' 8"  (1.727 m)     Wt 91.8 kg (202  lb 6.1 oz)    SpO2 96%    BMI 30.77 kg/m??       General:          Alert, cooperative, no distress, appears stated age.      Head:               Normocephalic, without obvious abnormality, atraumatic.   Eyes:               Conjunctivae clear, anicteric sclerae.  Pupils are equal  Nose:               Nares normal. No drainage or sinus tenderness.   Throat:             Lips, mucosa, and tongue normal.  No Thrush   Neck:               Supple, symmetrical,  no adenopathy, thyroid: non tender                           no carotid bruit and no JVD.   Lungs:             Clear to auscultation bilaterally.  No Wheezing or Rhonchi. No rales.   Chest wall:      No tenderness or deformity. No Accessory muscle use.   Heart:              Regular rate and rhythm,  no murmur, rub or gallop.   Abdomen:        Soft, non-tender. Not distended.  Bowel sounds normal. No masses     Current Medications:        Current Facility-Administered Medications:    ?  dextrose (D50) infusion 5-25 g, 10-50 mL, IntraVENous, PRN, Holland Falling, MD   ?  glucagon (GLUCAGEN) injection 1 mg, 1 mg, IntraMUSCular, PRN, Holland Falling, MD   ?  insulin glargine (LANTUS) injection 1-100 Units, 1-100 Units, SubCUTAneous, QHS, Holland Falling, MD, 23 Units at 05/17/19 2243   ?  insulin lispro (HUMALOG) injection 1-100 Units, 1-100 Units, SubCUTAneous, AC&HS, Holland Falling, MD, 8 Units at 05/18/19 1248   ?  insulin lispro (HUMALOG) injection 1-100 Units, 1-100 Units, SubCUTAneous, PRN, Holland Falling, MD   ?  sodium chloride (NS) flush 10-20 mL, 10-20 mL, IntraVENous, PRN, Charlaine Dalton, MD, 10 mL at 05/16/19 0945   ?  flecainide (TAMBOCOR) tablet 100 mg, 100 mg, Oral, Q12H, Macatangay-Geronilla, Constancia, MD, 100 mg at 05/18/19 0920   ?  losartan (COZAAR) tablet 12.5 mg, 12.5 mg, Oral, DAILY, Macatangay-Geronilla, Constancia, MD, 12.5 mg at 05/18/19 0920   ?  metoprolol tartrate (LOPRESSOR) tablet 25 mg, 25 mg, Oral, TID, Macatangay-Geronilla,  Constancia, MD, 25 mg at 05/18/19 0920   ?  rivaroxaban (XARELTO) tablet 20 mg, 20 mg, Oral, DAILY WITH DINNER, Charlaine Dalton, MD, 20 mg at 05/17/19 1700   ?  zolpidem (AMBIEN) tablet 5 mg, 5 mg, Oral, QHS PRN, Charlaine Dalton, MD   ?  sodium chloride (NS) flush 5-10 mL, 5-10 mL, IntraVENous, Q8H, Charlaine Dalton, MD, 10 mL at 05/18/19 3220   ?  sodium chloride (NS) flush 5-10 mL, 5-10 mL, IntraVENous, PRN, Charlaine Dalton, MD   ?  naloxone [facility]) injection 0.1 mg, 0.1 mg, IntraVENous, PRN, Charlaine Dalton, MD   ?  acetaminophen (TYLENOL) tablet 650 mg, 650 mg, Oral, Q6H PRN, Charlaine Dalton, MD   ?  HYDROcodone-acetaminophen (NORCO) 5-325 mg per tablet 1 Tab, 1 Tab, Oral, Q4H PRN, Charlaine Dalton, MD   ?  morphine injection 1 mg, 1 mg, IntraVENous, Q4H PRN, Charlaine Dalton, MD   ?  alum-mag hydroxide-simeth (MYLANTA) oral suspension 30 mL, 30 mL, Oral, Q4H PRN, Charlaine Dalton, MD   ?  simvastatin (ZOCOR) tablet 20 mg, 20 mg, Oral, QHS, Charlaine Dalton, MD, 20 mg at 05/17/19 2239     Labs:          Recent Results (from the past 24 hour(s))  GLUCOSE, POC          Collection Time: 05/17/19  4:00 PM         Result  Value  Ref Range            Glucose (POC)  160 (H)  65 - 105 mg/dL       GLUCOSE, POC          Collection Time: 05/17/19 10:10 PM         Result  Value  Ref Range            Glucose (POC)  153 (H)  65 - 105 mg/dL       CBC W/O DIFF          Collection Time: 05/18/19  5:22 AM         Result  Value  Ref Range            WBC  7.6  4.0 - 11.0 1000/mm3            RBC  3.91  3.80 - 5.70 M/uL       HGB  12.0 (L)  12.4 - 17.2 gm/dl       HCT  36.3 (L)  37.0 - 50.0 %       MCV  92.8  80.0 - 98.0 fL       MCH  30.7  23.0 - 34.6 pg       MCHC  33.1  30.0 - 36.0 gm/dl       PLATELET  180  140 - 450 1000/mm3       MPV  10.3 (H)  6.0 - 10.0 fL       RDW-SD  43.1  35.1 - 06.3         METABOLIC PANEL, BASIC          Collection Time: 05/18/19  5:22 AM         Result  Value  Ref Range            Sodium  141  136 - 145 mEq/L        Potassium  4.4  3.5 - 5.1 mEq/L       Chloride  111 (H)  98 - 107 mEq/L       CO2  28  21 - 32 mEq/L       Glucose  116 (H)  74 - 106 mg/dl       BUN  19  7 - 25 mg/dl       Creatinine  0.8  0.6 - 1.3 mg/dl       GFR est AA  >60          GFR est non-AA  >60          Calcium  8.8  8.5 - 10.1 mg/dl       Anion gap  2 (L)  5 - 15 mmol/L       GLUCOSE, POC          Collection Time: 05/18/19  8:10 AM         Result  Value  Ref Range            Glucose (POC)  142 (H)  65 - 105 mg/dL       GLUCOSE, POC          Collection Time: 05/18/19 11:51 AM         Result  Value  Ref Range  Glucose (POC)  135 (H)  65 - 105 mg/dL          Radiology     No results found.    2D Echo:      Left Ventricle   The left ventricle is normal in size. There is no thrombus. There is normal   left ventricular wall thickness. Left ventricular systolic function is    normal.   The estimated left ventricular ejection fraction is 55%. Indeterminate   diastolic function. No regional wall motion abnormalities noted.   ??   Right Ventricle   The right ventricle is normal size. There is normal right ventricular wall   thickness. Right ventricular systolic function is normal: TAPSE: 2.6cm.   ??   Atria   The left atrial size is normal. Left atrial volume index: 51ml/m2. Right   atrial size is normal. Right atrial volume index 16ml/m2. Mild left-to-right   flow, with a gradient measured at 88mmhg, seen crossing the interatrial    septum   at the level of the fossa ovalis consistent with the presence of a small   patent foramen ovalve (PFO) vs ASD.   ??   ??   Mitral Valve   Structurally normal mitral valve. Mitral valve prolapse cannot be excluded.   There is no mitral valve stenosis. There is no mitral regurgitation noted.   ??   Tricuspid Valve   The tricuspid valve is normal. There is no tricuspid valve prolapse. There is   no tricuspid stenosis. There is trace tricuspid regurgitation. Based on the   peak tricuspid regurgitation velocity the  estimated right ventricular    systolic   pressure is 29 mmHg.   ??   Aortic Valve   The aortic valve is normal in structure and function. There is no aortic   valvular vegetation. No aortic stenosis. No aortic regurgitation is present.   ??   Pulmonic Valve   The pulmonic valve leaflets are thin and pliable; valve motion is normal.   There is no pulmonic valvular stenosis. There is no pulmonic valvular   regurgitation.   ??   Great Vessels   The aortic root is normal size.   ??   ??   Pericardium/Pleural   There is no pericardial effusion.   ??      Portions of this electronic record were dictated using Dragon voice recognition software. Unintended errors in translation may occur.         Holland Falling, MD   [facility]   May 18, 2019   12:20 PM

## 2019-05-18 NOTE — Progress Notes (Signed)
INTERNAL MEDICINE PROGRESS NOTE    Date of note:      May 18, 2019  Patient:               [name], 73 y.o., male  Admit Date:        05/15/2019  Length of Stay:  1 day(s)    Overnight/24-hour Events:    Reviewed.  Subjective:     No acute events overnight.  Seen laying comfortably in bed.  Will be n.p.o. after midnight  Assessment:           Pre-syncope -most likely from paroxysmal A. fib with RVR consequent hypotension/orthostasis  Paroxysmal Atrial fibrillation/flutter  Uncontrolled type 2 diabetes  Hyperlipidemia  Hypertension  History of BPH   Plan:           ? Plans for DCCV on Monday 8???10 as per cardiology.  Will keep patient n.p.o. after midnight  ? Resumed paroxysmal AF/flutter RC meds: On flecainide and Toprol-XL which has now been resumed.  Continue on Xarelto for [facility]  ? Repeat EKG showing atrial flutter with variable AV block, incomplete RBBB, prolonged QT  ? Continue on beta-blockers for hypertension.  Losartan reduced by 50% as per cards. BP stable   ? Glucomander protocol started as metformin has been held and patient is concerned about glucose levels.      Diet: Cardiac / NPO after midnight  DVT PPX: On Xarelto    Total clinical care time is 35 minutes, including review of chart including all labs, radiology, past medical history, and discussion with patient and family. Greater than 50% of my time was spent in coordination of care and counseling     Disposition:               Home when medically stable  Objective:   BP 111/64 (BP 1 Location: Left arm, BP Patient Position: Sitting)    Pulse 98    Temp 98.4 ??F (36.9 ??C)    Resp 20    Ht 5\' 8"  (1.727 m)    Wt 91.8 kg (202 lb 6.1 oz)    SpO2 96%    BMI 30.77 kg/m??     General:          Alert, cooperative, no distress, appears stated age.     Head:               Normocephalic, without obvious abnormality, atraumatic.  Eyes:               Conjunctivae clear, anicteric sclerae.  Pupils are equal   Nose:               Nares normal. No drainage or sinus tenderness.  Throat:             Lips, mucosa, and tongue normal.  No Thrush  Neck:               Supple, symmetrical,  no adenopathy, thyroid: non tender                          no carotid bruit and no JVD.  Lungs:             Clear to auscultation bilaterally.  No Wheezing or Rhonchi. No rales.  Chest wall:      No tenderness or deformity. No Accessory muscle use.  Heart:              Regular  rate and rhythm,  no murmur, rub or gallop.  Abdomen:        Soft, non-tender. Not distended.  Bowel sounds normal. No masses  Current Medications:     Current Facility-Administered Medications:   ???  dextrose (D50) infusion 5-25 g, 10-50 mL, IntraVENous, PRN, Holland Falling, MD  ???  glucagon (GLUCAGEN) injection 1 mg, 1 mg, IntraMUSCular, PRN, Holland Falling, MD  ???  insulin glargine (LANTUS) injection 1-100 Units, 1-100 Units, SubCUTAneous, QHS, Holland Falling, MD, 23 Units at 05/17/19 2243  ???  insulin lispro (HUMALOG) injection 1-100 Units, 1-100 Units, SubCUTAneous, AC&HS, Holland Falling, MD, 8 Units at 05/18/19 1248  ???  insulin lispro (HUMALOG) injection 1-100 Units, 1-100 Units, SubCUTAneous, PRN, Holland Falling, MD  ???  sodium chloride (NS) flush 10-20 mL, 10-20 mL, IntraVENous, PRN, Charlaine Dalton, MD, 10 mL at 05/16/19 0945  ???  flecainide (TAMBOCOR) tablet 100 mg, 100 mg, Oral, Q12H, Macatangay-Geronilla, Constancia, MD, 100 mg at 05/18/19 0920  ???  losartan (COZAAR) tablet 12.5 mg, 12.5 mg, Oral, DAILY, Macatangay-Geronilla, Constancia, MD, 12.5 mg at 05/18/19 0920  ???  metoprolol tartrate (LOPRESSOR) tablet 25 mg, 25 mg, Oral, TID, Macatangay-Geronilla, Constancia, MD, 25 mg at 05/18/19 0920  ???  rivaroxaban (XARELTO) tablet 20 mg, 20 mg, Oral, DAILY WITH DINNER, Charlaine Dalton, MD, 20 mg at 05/17/19 1700  ???  zolpidem (AMBIEN) tablet 5 mg, 5 mg, Oral, QHS PRN, Charlaine Dalton, MD  ???  sodium chloride (NS) flush 5-10 mL, 5-10 mL, IntraVENous, Q8H, Charlaine Dalton, MD, 10 mL at 05/18/19 0610  ???  sodium chloride (NS) flush 5-10 mL, 5-10 mL, IntraVENous, PRN, Charlaine Dalton, MD  ???  naloxone (NARCAN) injection 0.1 mg, 0.1 mg, IntraVENous, PRN, Charlaine Dalton, MD  ???  acetaminophen (TYLENOL) tablet 650 mg, 650 mg, Oral, Q6H PRN, Charlaine Dalton, MD  ???  HYDROcodone-acetaminophen (NORCO) 5-325 mg per tablet 1 Tab, 1 Tab, Oral, Q4H PRN, Charlaine Dalton, MD  ???  morphine injection 1 mg, 1 mg, IntraVENous, Q4H PRN, Charlaine Dalton, MD  ???  alum-mag hydroxide-simeth (MYLANTA) oral suspension 30 mL, 30 mL, Oral, Q4H PRN, Charlaine Dalton, MD  ???  simvastatin (ZOCOR) tablet 20 mg, 20 mg, Oral, QHS, Charlaine Dalton, MD, 20 mg at 05/17/19 2239  Labs:     Recent Results (from the past 24 hour(s))   GLUCOSE, POC    Collection Time: 05/17/19  4:00 PM   Result Value Ref Range    Glucose (POC) 160 (H) 65 - 105 mg/dL   GLUCOSE, POC    Collection Time: 05/17/19 10:10 PM   Result Value Ref Range    Glucose (POC) 153 (H) 65 - 105 mg/dL   CBC W/O DIFF    Collection Time: 05/18/19  5:22 AM   Result Value Ref Range    WBC 7.6 4.0 - 11.0 1000/mm3    RBC 3.91 3.80 - 5.70 M/uL    HGB 12.0 (L) 12.4 - 17.2 gm/dl    HCT 36.3 (L) 37.0 - 50.0 %    MCV 92.8 80.0 - 98.0 fL    MCH 30.7 23.0 - 34.6 pg    MCHC 33.1 30.0 - 36.0 gm/dl    PLATELET 180 140 - 450 1000/mm3    MPV 10.3 (H) 6.0 - 10.0 fL    RDW-SD 43.1 35.1 - 84.6     METABOLIC PANEL, BASIC    Collection Time: 05/18/19  5:22 AM   Result Value Ref Range    Sodium 141 136 -  145 mEq/L    Potassium 4.4 3.5 - 5.1 mEq/L    Chloride 111 (H) 98 - 107 mEq/L    CO2 28 21 - 32 mEq/L    Glucose 116 (H) 74 - 106 mg/dl    BUN 19 7 - 25 mg/dl    Creatinine 0.8 0.6 - 1.3 mg/dl    GFR est AA >60      GFR est non-AA >60      Calcium 8.8 8.5 - 10.1 mg/dl    Anion gap 2 (L) 5 - 15 mmol/L   GLUCOSE, POC    Collection Time: 05/18/19  8:10 AM   Result Value Ref Range    Glucose (POC) 142 (H) 65 - 105 mg/dL   GLUCOSE, POC    Collection Time: 05/18/19 11:51 AM    Result Value Ref Range    Glucose (POC) 135 (H) 65 - 105 mg/dL     Radiology   No results found.   2D Echo:    Left Ventricle  The left ventricle is normal in size. There is no thrombus. There is normal  left ventricular wall thickness. Left ventricular systolic function is   normal.  The estimated left ventricular ejection fraction is 55%. Indeterminate  diastolic function. No regional wall motion abnormalities noted.  ??  Right Ventricle  The right ventricle is normal size. There is normal right ventricular wall  thickness. Right ventricular systolic function is normal: TAPSE: 2.6cm.  ??  Atria  The left atrial size is normal. Left atrial volume index: 41ml/m2. Right  atrial size is normal. Right atrial volume index 45ml/m2. Mild left-to-right  flow, with a gradient measured at 14mmhg, seen crossing the interatrial   septum  at the level of the fossa ovalis consistent with the presence of a small  patent foramen ovalve (PFO) vs ASD.  ??  ??  Mitral Valve  Structurally normal mitral valve. Mitral valve prolapse cannot be excluded.  There is no mitral valve stenosis. There is no mitral regurgitation noted.  ??  Tricuspid Valve  The tricuspid valve is normal. There is no tricuspid valve prolapse. There is  no tricuspid stenosis. There is trace tricuspid regurgitation. Based on the  peak tricuspid regurgitation velocity the estimated right ventricular   systolic  pressure is 29 mmHg.  ??  Aortic Valve  The aortic valve is normal in structure and function. There is no aortic  valvular vegetation. No aortic stenosis. No aortic regurgitation is present.  ??  Pulmonic Valve  The pulmonic valve leaflets are thin and pliable; valve motion is normal.  There is no pulmonic valvular stenosis. There is no pulmonic valvular  regurgitation.  ??  Great Vessels  The aortic root is normal size.  ??  ??  Pericardium/Pleural  There is no pericardial effusion.  ??    Portions of this electronic record were dictated using Dragon voice  recognition software. Unintended errors in translation may occur.      Holland Falling, MD  [facility]  May 18, 2019  12:20 PM

## 2019-05-18 NOTE — Other (Signed)
Received aaox4 without c/o. Coffee offered and accepted. Call bell in reach

## 2019-05-18 NOTE — Progress Notes (Signed)
CARDIOLOGY PROGRESS NOTE    RECS:  ?? Continue flecainide 100 mg BID and metoprolol 25 mg TID  ?? Plan for DC cardioversion on tomorrow.  NPO after midnight tomorrow except for sips of water for medications.  ?? Hypertension is well controlled with the low dose losartan and TID dosing of metoprolol  ?? Continue xarelto for stroke risk reduction  ?? zio at outpt for presyncope evaluation to assure no tachy-brady events or ventricular arrhythmias.    ASSESSMENT:  [name] is a 73 y.o. male  With afib  Remains in afib today with HR's in the 90-100's.  ??  Paroxysmal AF/AFL  ????????????????????????H/o DCCV (June 2020) in [place], [place]??  ????????????????????????H/o DCCV (06/2016) in [place], [place]  ????????????????????????On Flecainide & Toprol XL  ????????????????????????NST (Oct 2018): EF 68% with No Ischemia/Infarct   PFO  ????????????????????????Echo (Aug 2020): EF 55% with No WMA. ??NL R Heart. ??No signif. Valve Dz. ??RVSP 29. 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  Hyperlipidemia- on zocor  Prostate CA  ????????????????????????S/p Prostatectomy (July 2017)  DM, type 2  BPH  Former Smoker      Active Problems:    Near syncope (05/15/2019)        SUBJECTIVE:  No CP or SOB. Breathing is much good. He is able to lay supine in bed.  He still feels a fluttering in his chest. He denies lightheadedness and dizziness.     VS:   Visit Vitals  BP 113/72 (BP 1 Location: Left arm, BP Patient Position: Supine)   Pulse 77   Temp 98.4 ??F (36.9 ??C)   Resp 16   Ht 5\' 8"  (1.727 m)   Wt 91.8 kg (202 lb 6.1 oz)   SpO2 98%   BMI 30.77 kg/m??     Last 3 Recorded Weights in this Encounter    05/15/19 1902 05/17/19 0431 05/18/19 0706   Weight: 90.7 kg (200 lb) 90.4 kg (199 lb 4.7 oz) 91.8 kg (202 lb 6.1 oz)      Body mass index is 30.77 kg/m??.   No intake or output data in the 24 hours ending 05/18/19 1053    TELE personally reviewed by me: afib at 90 bpm    EXAM:  General:  No acute distress   Eyes: EOMI, No icterus  Neck: JVD is absent, No carotid bruits  Lungs: clear, no rales, no rhonchi, no wheezes. Good inspiratory effort.  Cardiac:  Irreguarly irregular, No murmur, normal s1 and s2, no s4. No gallops.  Abdomen: soft, non-tender, active bowel sounds  Ext: Edema is absent, pulses 2+   Skin:  Warm and dry  Neuro: Alert and oriented x3  Psych: Normal mood and affect     Labs:  Basic Metabolic Profile   Lab Results   Component Value Date/Time    NA 141 05/18/2019 05:22 AM    NA 139 05/17/2019 07:00 AM    NA 139 05/16/2019 03:35 AM    K 4.4 05/18/2019 05:22 AM    K 4.7 05/17/2019 07:00 AM    K 4.1 05/16/2019 03:35 AM    CL 111 (H) 05/18/2019 05:22 AM    CL 109 (H) 05/17/2019 07:00 AM    CL 110 (H) 05/16/2019 03:35 AM    CO2 28 05/18/2019 05:22 AM    CO2 29 05/17/2019 07:00 AM    CO2 27 05/16/2019 03:35 AM  BUN 19 05/18/2019 05:22 AM    BUN 13 05/17/2019 07:00 AM    BUN 15 05/16/2019 03:35 AM    CREA 0.8 05/18/2019 05:22 AM    CREA 0.8 05/17/2019 07:00 AM    CREA 0.7 05/16/2019 03:35 AM    GLU 116 (H) 05/18/2019 05:22 AM    GLU 121 (H) 05/17/2019 07:00 AM    GLU 102 05/16/2019 03:35 AM    CA 8.8 05/18/2019 05:22 AM    CA 8.8 05/17/2019 07:00 AM    CA 8.6 05/16/2019 03:35 AM    MG 1.8 02/05/2017 11:55 AM    MG 1.6 (L) 07/14/2014 05:14 PM    PHOS 2.5 02/05/2017 11:55 AM        CBC w/Diff    Lab Results   Component Value Date/Time    WBC 7.6 05/18/2019 05:22 AM    WBC 8.0 05/17/2019 07:00 AM    WBC 8.8 05/16/2019 03:35 AM    RBC 3.91 05/18/2019 05:22 AM    RBC 3.82 05/17/2019 07:00 AM    RBC 3.69 (L) 05/16/2019 03:35 AM    HGB 12.0 (L) 05/18/2019 05:22 AM    HGB 11.8 (L) 05/17/2019 07:00 AM    HGB 11.4 (L) 05/16/2019 03:35 AM    HCT 36.3 (L) 05/18/2019 05:22 AM    HCT 35.3 (L) 05/17/2019 07:00 AM    HCT 33.9 (L) 05/16/2019 03:35 AM    MCV 92.8 05/18/2019 05:22 AM    MCV 92.4 05/17/2019 07:00 AM    MCV 91.9 05/16/2019 03:35 AM    MCH 30.7 05/18/2019 05:22 AM    MCH 30.9 05/17/2019 07:00 AM     MCH 30.9 05/16/2019 03:35 AM    MCHC 33.1 05/18/2019 05:22 AM    MCHC 33.4 05/17/2019 07:00 AM    MCHC 33.6 05/16/2019 03:35 AM    PLT 180 05/18/2019 05:22 AM    PLT 173 05/17/2019 07:00 AM    PLT 167 05/16/2019 03:35 AM    Lab Results   Component Value Date/Time    GRANS 44.7 05/16/2019 03:35 AM    GRANS 48.9 05/15/2019 07:25 PM    GRANS 47.8 07/05/2017 03:50 AM    LYMPH 42.5 05/16/2019 03:35 AM    LYMPH 37.1 05/15/2019 07:25 PM    LYMPH 37.4 07/05/2017 03:50 AM    MONOS 7.0 05/16/2019 03:35 AM    MONOS 7.2 05/15/2019 07:25 PM    MONOS 9.3 07/05/2017 03:50 AM    EOS 4.9 05/16/2019 03:35 AM    EOS 5.7 (H) 05/15/2019 07:25 PM    EOS 4.5 07/05/2017 03:50 AM    BASOS 0.6 05/16/2019 03:35 AM    BASOS 0.8 05/15/2019 07:25 PM    BASOS 0.6 07/05/2017 03:50 AM        Cardiac Enzymes   Lab Results   Component Value Date/Time    CPK 58 07/16/2014 12:36 PM    CPK 54 07/16/2014 04:03 AM    CPK 56 07/15/2014 04:14 PM    CKMB 1.3 07/16/2014 12:36 PM    CKMB 0.9 07/16/2014 04:03 AM    CKMB 0.9 07/15/2014 04:14 PM        Coagulation   No results found for: PTP, INR, APTT, INREXT     Medications:    Current Facility-Administered Medications   Medication Dose Route Frequency   ??? insulin glargine (LANTUS) injection 1-100 Units  1-100 Units SubCUTAneous QHS   ??? insulin lispro (HUMALOG) injection 1-100 Units  1-100 Units SubCUTAneous AC&HS   ???  flecainide (TAMBOCOR) tablet 100 mg  100 mg Oral Q12H   ??? losartan (COZAAR) tablet 12.5 mg  12.5 mg Oral DAILY   ??? metoprolol tartrate (LOPRESSOR) tablet 25 mg  25 mg Oral TID   ??? rivaroxaban (XARELTO) tablet 20 mg  20 mg Oral DAILY WITH DINNER   ??? sodium chloride (NS) flush 5-10 mL  5-10 mL IntraVENous Q8H   ??? simvastatin (ZOCOR) tablet 20 mg  20 mg Oral QHS          Sheran Luz, MD  [facility]  [facility]      May 18, 2019  10:53 AM

## 2019-05-18 NOTE — Other (Signed)
Bedside shift change report given to Temitope A. Marcello Moores, RN (Soil scientist) by Matt Holmes Engineer, drilling). Report included the following information SBAR, Kardex and Cardiac Rhythm afib.

## 2019-05-19 ENCOUNTER — Inpatient Hospital Stay: Admit: 2019-05-20 | Payer: MEDICARE | Attending: Anesthesiology | Primary: Internal Medicine

## 2019-05-19 LAB — EKG 12-LEAD
Atrial Rate: 70 {beats}/min
Diagnosis: NORMAL
P Axis: 57 degrees
P-R Interval: 196 ms
Q-T Interval: 430 ms
QRS Duration: 98 ms
QTc Calculation (Bazett): 464 ms
R Axis: 61 degrees
T Axis: 84 degrees
Ventricular Rate: 70 {beats}/min

## 2019-05-19 LAB — CBC
Hematocrit: 36.5 % — ABNORMAL LOW (ref 37.0–50.0)
Hemoglobin: 12.3 gm/dl — ABNORMAL LOW (ref 12.4–17.2)
MCH: 31.3 pg (ref 23.0–34.6)
MCHC: 33.7 gm/dl (ref 30.0–36.0)
MCV: 92.9 fL (ref 80.0–98.0)
MPV: 10.4 fL — ABNORMAL HIGH (ref 6.0–10.0)
Platelets: 191 10*3/uL (ref 140–450)
RBC: 3.93 M/uL (ref 3.80–5.70)
RDW-SD: 43.1 (ref 35.1–43.9)
WBC: 8.5 10*3/uL (ref 4.0–11.0)

## 2019-05-19 LAB — BASIC METABOLIC PANEL
Anion Gap: 2 mmol/L — ABNORMAL LOW (ref 5–15)
BUN: 17 mg/dl (ref 7–25)
CO2: 27 mEq/L (ref 21–32)
Calcium: 8.8 mg/dl (ref 8.5–10.1)
Chloride: 111 mEq/L — ABNORMAL HIGH (ref 98–107)
Creatinine: 0.9 mg/dl (ref 0.6–1.3)
EGFR IF NonAfrican American: >60
GFR African American: >60
Glucose: 126 mg/dl — ABNORMAL HIGH (ref 74–106)
Potassium: 4.2 mEq/L (ref 3.5–5.1)
Sodium: 140 mEq/L (ref 136–145)

## 2019-05-19 LAB — POCT GLUCOSE
POC Glucose: 144 mg/dL — ABNORMAL HIGH (ref 65–105)
POC Glucose: 135 mg/dL — ABNORMAL HIGH (ref 65–105)
POC Glucose: 201 mg/dL — ABNORMAL HIGH (ref 65–105)
POC Glucose: 206 mg/dL — ABNORMAL HIGH (ref 65–105)

## 2019-05-19 LAB — METABOLIC PANEL, BASIC
Anion gap: 2 mmol/L — ABNORMAL LOW (ref 5–15)
BUN: 17 mg/dl (ref 7–25)
CO2: 27 mEq/L (ref 21–32)
Calcium: 8.8 mg/dl (ref 8.5–10.1)
Chloride: 111 mEq/L — ABNORMAL HIGH (ref 98–107)
Creatinine: 0.9 mg/dl (ref 0.6–1.3)
GFR est AA: >60
GFR est non-AA: >60
Glucose: 126 mg/dl — ABNORMAL HIGH (ref 74–106)
Potassium: 4.2 mEq/L (ref 3.5–5.1)
Sodium: 140 mEq/L (ref 136–145)

## 2019-05-19 LAB — CBC W/O DIFF
HCT: 36.5 % — ABNORMAL LOW (ref 37.0–50.0)
HGB: 12.3 gm/dl — ABNORMAL LOW (ref 12.4–17.2)
MCH: 31.3 pg (ref 23.0–34.6)
MCHC: 33.7 gm/dl (ref 30.0–36.0)
MCV: 92.9 fL (ref 80.0–98.0)
MPV: 10.4 fL — ABNORMAL HIGH (ref 6.0–10.0)
PLATELET: 191 10*3/uL (ref 140–450)
RBC: 3.93 M/uL (ref 3.80–5.70)
RDW-SD: 43.1 (ref 35.1–43.9)
WBC: 8.5 10*3/uL (ref 4.0–11.0)

## 2019-05-19 LAB — EKG, 12 LEAD, INITIAL
Atrial Rate: 70 {beats}/min
Calculated P Axis: 57 degrees
Calculated R Axis: 61 degrees
Calculated T Axis: 84 degrees
Diagnosis: NORMAL
P-R Interval: 196 ms
Q-T Interval: 430 ms
QRS Duration: 98 ms
QTC Calculation (Bezet): 464 ms
Ventricular Rate: 70 {beats}/min

## 2019-05-19 LAB — GLUCOSE, POC
Glucose (POC): 144 mg/dL — ABNORMAL HIGH (ref 65–105)
Glucose (POC): 135 mg/dL — ABNORMAL HIGH (ref 65–105)
Glucose (POC): 201 mg/dL — ABNORMAL HIGH (ref 65–105)
Glucose (POC): 206 mg/dL — ABNORMAL HIGH (ref 65–105)

## 2019-05-19 MED ORDER — SODIUM CHLORIDE 0.9 % IV
INTRAVENOUS | Status: DC | PRN
Start: 2019-05-19 — End: 2019-05-19
  Administered 2019-05-19: 14:00:00 via INTRAVENOUS

## 2019-05-19 MED ORDER — METOPROLOL TARTRATE 25 MG TAB
25 mg | ORAL_TABLET | Freq: Three times a day (TID) | ORAL | 2 refills | Status: AC
Start: 2019-05-19 — End: 2019-06-18

## 2019-05-19 MED ORDER — PROPOFOL 10 MG/ML IV EMUL
10 mg/mL | INTRAVENOUS | Status: DC | PRN
Start: 2019-05-19 — End: 2019-05-19
  Administered 2019-05-19: 15:00:00 via INTRAVENOUS

## 2019-05-19 MED ORDER — LOSARTAN 25 MG TAB
25 mg | ORAL_TABLET | Freq: Every day | ORAL | 2 refills | Status: AC
Start: 2019-05-19 — End: 2019-06-19

## 2019-05-19 MED ORDER — FLECAINIDE 100 MG TAB
100 mg | ORAL_TABLET | Freq: Two times a day (BID) | ORAL | 2 refills | Status: AC
Start: 2019-05-19 — End: 2019-06-18

## 2019-05-19 MED FILL — BD POSIFLUSH NORMAL SALINE 0.9 % INJECTION SYRINGE: INTRAMUSCULAR | Qty: 10

## 2019-05-19 MED FILL — XARELTO 20 MG TABLET: 20 mg | ORAL | Qty: 1

## 2019-05-19 MED FILL — INSULIN GLARGINE 100 UNIT/ML INJECTION: 100 unit/mL | SUBCUTANEOUS | Qty: 1

## 2019-05-19 MED FILL — SIMVASTATIN 20 MG TAB: 20 mg | ORAL | Qty: 1

## 2019-05-19 MED FILL — METOPROLOL TARTRATE 25 MG TAB: 25 mg | ORAL | Qty: 1

## 2019-05-19 MED FILL — FLECAINIDE 50 MG TAB: 50 mg | ORAL | Qty: 2

## 2019-05-19 MED FILL — INSULIN LISPRO 100 UNIT/ML INJECTION: 100 unit/mL | SUBCUTANEOUS | Qty: 1

## 2019-05-19 MED FILL — LOSARTAN 25 MG TAB: 25 mg | ORAL | Qty: 1

## 2019-05-19 NOTE — Anesthesia Post-Procedure Evaluation (Signed)
*   No procedures listed *.    general, total IV anesthesia    Anesthesia Post Evaluation      Multimodal analgesia: multimodal analgesia used between 6 hours prior to anesthesia start to PACU discharge  Patient location during evaluation: PACU  Patient participation: complete - patient participated  Level of consciousness: awake and alert  Pain management: adequate  Airway patency: patent  Anesthetic complications: no  Cardiovascular status: acceptable  Respiratory status: acceptable  Hydration status: acceptable  Post anesthesia nausea and vomiting:  none  Final Post Anesthesia Temperature Assessment:  Normothermia (36.0-37.5 degrees C)      INITIAL Post-op Vital signs:   Vitals Value Taken Time   BP 106/82 05/19/2019 10:49 AM   Temp     Pulse 65 05/19/2019 10:49 AM   Resp 16 05/19/2019 10:49 AM   SpO2 96 % 05/19/2019 10:49 AM

## 2019-05-19 NOTE — Progress Notes (Signed)
Progress Notes by [name], MD at 05/19/19 0825                Author: [name], MD  Service: Cardiology  Author Type: Physician       Filed: 05/19/19 0930  Date of Service: 05/19/19 0825  Status: Signed          Editor: [name], MD (Physician)                       [facility] CARDIOLOGY PROGRESS NOTE          Patient: [name]  Age: 73 y.o.  Sex: male          Date of Birth: 07/18/1946  Admit Date: 05/15/2019  PCP: Maylon Peppers, MD     MRN: [id number]   CSN: [id number]              RECS:   ??  Continue flecainide 100 mg BID and metoprolol 25 mg TID   ??  Keep NPO for DCCV today.   ??  Hypertension is well controlled with the low dose losartan and TID dosing of metoprolol   ??  Continue xarelto for stroke risk reduction   ??  zio at outpt for presyncope evaluation to assure no tachy-brady events or ventricular arrhythmias.   ??   ASSESSMENT:   GERTRUDE TARBET is a 73 y.o. male  With afib   Remains in afib today with HR's in the 90-100's.   ??   Paroxysmal AF/AFL   ????????????????????????H/o DCCV (June 2020) in [place], [place]??   ????????????????????????H/o DCCV (06/2016) in [place], [place]   ????????????????????????On Flecainide & Toprol XL   ????????????????????????NST (Oct 2018): EF 68% with No Ischemia/Infarct    PFO   ????????????????????????Echo (Aug 2020): EF 55% with No WMA. ??NL R Heart. ??No signif. Valve Dz. ??RVSP 29. 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   Hyperlipidemia- on zocor   Prostate CA   ????????????????????????S/p Prostatectomy (July 2017)   DM, type 2   BPH   Former Smoker   ??            [name], MD   [facility])   Pager [phone]      May 19, 2019   8:25 AM            Active Problems:     Near syncope (05/15/2019)            SUBJECTIVE:   No acute events overnight. The patient denies any chest pain or  dyspnea.      VS:    Visit Vitals      BP  114/81 (BP 1 Location: Left arm, BP Patient Position: Supine)     Pulse  83     Temp  98.4 ??F (36.9 ??C)     Resp  18     Ht  5\' 8"  (1.727 m)     Wt  91.5 kg (201 lb 11.5 oz)     SpO2  97%        BMI  30.67 kg/m??          Last 3 Recorded Weights in this Encounter            05/17/19 0431  05/18/19 0706  05/19/19 0522  Weight:  90.4 kg (199 lb 4.7 oz)  91.8 kg (202 lb 6.1 oz)  91.5 kg (201 lb 11.5 oz)         Body mass index is 30.67 kg/m??.       Intake/Output Summary (Last 24 hours) at 05/19/2019 0825   Last data filed at 05/18/2019 1331     Gross per 24 hour        Intake  240 ml        Output  --        Net  240 ml           TELEMETRY personally reviewed by me: Atrial fibrillation with 90's bpm   EXAM:   General:  Awake, alert, not in distress   Neck: JVD is absent   Lungs: clear, no rales, no rhonchi    Cardiac:  Irregular Rhythm, No murmur, Gallops or Rubs   Abdomen: soft, nl bowel sounds   Extremities: Edema is absent. Warm extremities. No cyanosis.   Neuro: Alert and oriented; Nonfocal      LABORATORY TESTS:     Basic Metabolic Profile     Lab Results      Component  Value  Date/Time        NA  140  05/19/2019 05:51 AM        NA  141  05/18/2019 05:22 AM        NA  139  05/17/2019 07:00 AM        K  4.2  05/19/2019 05:51 AM        K  4.4  05/18/2019 05:22 AM        K  4.7  05/17/2019 07:00 AM        CL  111 (H)  05/19/2019 05:51 AM        CL  111 (H)  05/18/2019 05:22 AM        CL  109 (H)  05/17/2019 07:00 AM        CO2  27  05/19/2019 05:51 AM        CO2  28  05/18/2019 05:22 AM        CO2  29  05/17/2019 07:00 AM        BUN  17  05/19/2019 05:51 AM        BUN  19  05/18/2019 05:22 AM        BUN  13  05/17/2019 07:00 AM        CREA  0.9  05/19/2019 05:51 AM        CREA  0.8  05/18/2019 05:22 AM        CREA  0.8  05/17/2019 07:00 AM        GLU  126 (H)  05/19/2019 05:51 AM        GLU  116 (H)  05/18/2019 05:22 AM        GLU  121 (H)  05/17/2019 07:00 AM        CA   8.8  05/19/2019 05:51 AM        CA  8.8  05/18/2019 05:22 AM        CA  8.8  05/17/2019 07:00 AM        MG  1.8  02/05/2017 11:55 AM        MG  1.6 (L)  07/14/2014 05:14 PM        PHOS  2.5  02/05/2017 11:55 AM  CBC w/Diff       Lab Results      Component  Value  Date/Time        WBC  8.5  05/19/2019 05:51 AM        WBC  7.6  05/18/2019 05:22 AM        WBC  8.0  05/17/2019 07:00 AM        RBC  3.93  05/19/2019 05:51 AM        RBC  3.91  05/18/2019 05:22 AM        RBC  3.82  05/17/2019 07:00 AM        HGB  12.3 (L)  05/19/2019 05:51 AM        HGB  12.0 (L)  05/18/2019 05:22 AM        HGB  11.8 (L)  05/17/2019 07:00 AM        HCT  36.5 (L)  05/19/2019 05:51 AM        HCT  36.3 (L)  05/18/2019 05:22 AM        HCT  35.3 (L)  05/17/2019 07:00 AM        MCV  92.9  05/19/2019 05:51 AM        MCV  92.8  05/18/2019 05:22 AM        MCV  92.4  05/17/2019 07:00 AM        MCH  31.3  05/19/2019 05:51 AM        MCH  30.7  05/18/2019 05:22 AM        MCH  30.9  05/17/2019 07:00 AM        MCHC  33.7  05/19/2019 05:51 AM        MCHC  33.1  05/18/2019 05:22 AM        MCHC  33.4  05/17/2019 07:00 AM        PLT  191  05/19/2019 05:51 AM        PLT  180  05/18/2019 05:22 AM        PLT  173  05/17/2019 07:00 AM           Lab Results      Component  Value  Date/Time        GRANS  44.7  05/16/2019 03:35 AM        GRANS  48.9  05/15/2019 07:25 PM        GRANS  47.8  07/05/2017 03:50 AM        LYMPH  42.5  05/16/2019 03:35 AM        LYMPH  37.1  05/15/2019 07:25 PM        LYMPH  37.4  07/05/2017 03:50 AM        MONOS  7.0  05/16/2019 03:35 AM        MONOS  7.2  05/15/2019 07:25 PM        MONOS  9.3  07/05/2017 03:50 AM        EOS  4.9  05/16/2019 03:35 AM        EOS  5.7 (H)  05/15/2019 07:25 PM        EOS  4.5  07/05/2017 03:50 AM        BASOS  0.6  05/16/2019 03:35 AM        BASOS  0.8  05/15/2019 07:25 PM        BASOS  0.6  07/05/2017 03:50 AM  Cardiac Enzymes     Lab Results      Component  Value   Date/Time        CPK  58  07/16/2014 12:36 PM        CPK  54  07/16/2014 04:03 AM        CPK  56  07/15/2014 04:14 PM        CKMB  1.3  07/16/2014 12:36 PM        CKMB  0.9  07/16/2014 04:03 AM        CKMB  0.9  07/15/2014 04:14 PM                  Coagulation       No results found for: PTP, INR, APTT, INREXT        MEDICATIONS:        Current Facility-Administered Medications          Medication  Dose  Route  Frequency           ?  insulin glargine (LANTUS) injection 1-100 Units   1-100 Units  SubCUTAneous  QHS     ?  insulin lispro (HUMALOG) injection 1-100 Units   1-100 Units  SubCUTAneous  AC&HS     ?  flecainide (TAMBOCOR) tablet 100 mg   100 mg  Oral  Q12H     ?  losartan (COZAAR) tablet 12.5 mg   12.5 mg  Oral  DAILY     ?  metoprolol tartrate (LOPRESSOR) tablet 25 mg   25 mg  Oral  TID     ?  rivaroxaban (XARELTO) tablet 20 mg   20 mg  Oral  DAILY WITH DINNER     ?  sodium chloride (NS) flush 5-10 mL   5-10 mL  IntraVENous  Q8H           ?  simvastatin (ZOCOR) tablet 20 mg   20 mg  Oral  QHS

## 2019-05-19 NOTE — Procedures (Signed)
Direct Current Cardioversion Procedure Note    Procedure:   1) External cardioversion (32671)     Indication: Symptomatic atrial fibrillation    Informed consent was obtained after full discussion of all risks/benefits/complications and alternatives.  Please see full chart for further details.    Patient received anesthesia as per anesthesia records.    Initial rhythm: Atrial fibrillation  Patient received 1 synchronized biphasic shock(s) at 200 J.  Final Rhythm was Sinus rhythm    12 lead ECG: Sinus rhythm    Impression:  1. Successful DC cardioversion of atrial fibrillation to sinus rhythm    Recs:  1. Continue Flecainide 100 mg BID and Metoprolol.  2. Continue Xarelto 20  Mg daily.  3. Discharge later today.

## 2019-05-19 NOTE — Anesthesia Pre-Procedure Evaluation (Signed)
Relevant Problems   No relevant active problems       Anesthetic History   No history of anesthetic complications            Review of Systems / Medical History  Patient summary reviewed, nursing notes reviewed and pertinent labs reviewed    Pulmonary  Within defined limits                 Neuro/Psych         Neuromuscular disease (peripheral neuropathy)    Comments: Chronic fatigue Cardiovascular            Dysrhythmias : atrial fibrillation  Hyperlipidemia    Exercise tolerance: >4 METS  Comments: 8/7 echo:  1. Left ventricular systolic function is normal. No regional wall motion  abnormalities noted. The estimated left ventricular ejection fraction is 55%.  2. The left ventricle is normal in size with normal wall thickness.  3. Indeterminate diastolic function.  4. The right ventricle is normal size with normal function.  5. There is no hemodynamically significant valvular pathology.  6. Based on the peak tricuspid regurgitation velocity the estimated right  ventricular systolic pressure is 29 mmHg.  7. Normal size of the left and right atrium.  8. Mild left-to-right flow, with a gradient measured at 36mmhg, seen crossing  the interatrial septum at the level of the fossa ovalis consistent with the  presence of a small patent foramen ovale (PFO) vs ASD.   GI/Hepatic/Renal     GERD    Renal disease       Endo/Other    Diabetes    Cancer (prostate) and anemia     Other Findings            Physical Exam    Airway  Mallampati: II  TM Distance: 4 - 6 cm  Neck ROM: normal range of motion   Mouth opening: Normal     Cardiovascular  Regular rate and rhythm,  S1 and S2 normal,  no murmur, click, rub, or gallop             Dental  No notable dental hx       Pulmonary  Breath sounds clear to auscultation               Abdominal  GI exam deferred       Other Findings            Anesthetic Plan    ASA: 3  Anesthesia type: general            Anesthetic plan and risks discussed with: Patient

## 2019-05-19 NOTE — Discharge Summary (Signed)
Hospitalist Discharge Summary      Discharge Summary   Admit Date: 05/15/2019  Discharge Date:  05/19/19      Patient ID:  NIL [name]  73 y.o.  07/23/1946    Chief Complaint   Patient presents with   ??? Irregular Heart Beat   ??? Dizziness       Discharge Diagnoses    1.  Presyncope likely due to paroxysmal A. fib with rapid ventricular response and subsequent orthostasis  -Improved    2.  Paroxysmal atrial fibrillation/flutter  -Status post cardioversion with cardiology on 8/10  -Flecainide increased to 100 mg twice daily, metoprolol increased to 25 mg 3 times daily  -Losartan dose has been decreased to 12.5 mg daily  -Cardiology also recommends outpatient ZIO monitoring and if normal recommend loop monitoring.    3.  Type 2 diabetes  4.  Hyperlipidemia  5.  Hypertension      Current Discharge Medication List      CONTINUE these medications which have CHANGED    Details   flecainide (TAMBOCOR) 100 mg tablet Take 1 Tab by mouth every twelve (12) hours for 30 days. Indications: prevention of recurrent atrial fibrillation  Qty: 60 Tab, Refills: 2      losartan (COZAAR) 25 mg tablet Take 0.5 Tabs by mouth daily for 30 days. Indications: high blood pressure  Qty: 15 Tab, Refills: 2      metoprolol tartrate (LOPRESSOR) 25 mg tablet Take 1 Tab by mouth three (3) times daily for 30 days. Indications: ventricular rate control in atrial fibrillation  Qty: 90 Tab, Refills: 2         CONTINUE these medications which have NOT CHANGED    Details   simvastatin (ZOCOR) 20 mg tablet Take 1 Tab by mouth daily.      acetaminophen (TYLENOL) 325 mg tablet Take 650 mg by mouth as needed.      metFORMIN (GLUCOPHAGE) 1,000 mg tablet 1,000 mg two (2) times daily (with meals).      XARELTO 20 mg tab tablet Take 20 mg by mouth daily (with dinner).    Associated Diagnoses: Elevated PSA; Microscopic hematuria         STOP taking these medications       metoprolol succinate (TOPROL-XL) 25 mg XL tablet Comments:   Reason for Stopping:                  Initial presentation:  From HPI:  [name] is a 73 y.o. WHITE OR CAUC[place]IAN male who presents with dizziness, lightheadedness since 330 this afternoon  Patient this evening had a near syncope episode felt like he was going to pass out however denies losing consciousness  Denies any chest pain, shortness of breath palpitation nausea vomiting, has history of atrial fibrillation, is on flecainide the last 2 months  Patient has history of A. fib, recently moved back to this area after having gone to [place] [employer]  for a job, said few weeks ago had another episodes of A. fib, following which his flecainide was increased to twice a day  He said he intermittently gets this feeling of palpitations where he can tell his heart rate is going fast, along with some chest discomfort but never had any chest pain  He continues to take Xarelto, beta-blocker    Patient was admitted to telemetry with plans to monitor, continue beta-blocker, trend troponin, check ART echocardiogram, and cardiology consultation.    Hospital Course:   Cardiology evaluated on the seventh  noting symptomatic paroxysmal atrial fibrillation intermittent atypical atrial flutter and RVR with recurrence on low-dose flecainide.  Plan increase flecainide to 100 mg twice daily and if patient has continued rapid response to plan for direct-current cardioversion on Monday.  Also plan to increase metoprolol and decrease losartan to avoid hypotension.  Echocardiogram was performed with findings as described below.  Patient was continued on Xarelto.  Patient remained hospitalized over the weekend awaiting cardioversion on Monday.  Cardiology performed cardioversion on the 10th with successful cardioversion to sinus rhythm.  Recommend to continue flecainide and 100 mg twice daily with metoprolol 3 times daily and continue Xarelto.  The patient reports feeling improved since this time and is otherwise plan for discharge today.    Procedures: Direct-current  cardioversion on 8/10 with Dr. Jillene Bucks.    Procedure:   1) External cardioversion (59563)   ??  Indication: Symptomatic atrial fibrillation  ??  Informed consent was obtained after full discussion of all risks/benefits/complications and alternatives.  Please see full chart for further details.  ??  Patient received anesthesia as per anesthesia records.  ??  Initial rhythm: Atrial fibrillation  Patient received 1 synchronized biphasic shock(s) at 200 J.  Final Rhythm was Sinus rhythm  ??  12 lead ECG: Sinus rhythm  ??  Impression:  1. Successful DC cardioversion of atrial fibrillation to sinus rhythm  ??  Recs:  1. Continue Flecainide 100 mg BID and Metoprolol.  2. Continue Xarelto 20  Mg daily.  3. Discharge later today.      Consultants:   Dr. [name] and colleagues of Cardiology.    Imaging:  Xr Chest Sngl V    Result Date: 05/15/2019  EXAM:  Chest AP INDICATIONS: palpitations, lightheadedness, dizziness, irregular heart rate COMPARISON: Most recently 07/05/2017. FINDINGS: Chronic blunting of the left costophrenic angle, unchanged. No consolidation, pleural effusion, or pulmonary edema. No pneumothorax. Normal cardiomediastinal contours.     IMPRESSION: No acute cardiopulmonary process.         Duplex Carotid Bilateral    Result Date: 05/16/2019                                                                Study ID: [facility]                                           [id number] [place],                                         [place]                              Carotid Duplex Report Name: [name], Darius Date: 05/16/2019 08:28 AM MRN: [id number]                       Patient Location: BH^AL93^[id number]^IOXB DOB: 26-Feb-1946                  Age: 34 yrs Gender: Male                     Account #: [id number] Reason For Study: Syncope. Ordering Physician: Charlaine Dalton Referring Physician: Charlaine Dalton Performed By: Patsey Berthold, RVT Interpretation Summary No evidence of stenosis noted in the internal carotid artery bilaterally. Antegrade flow noted in both vertebral arteries. Normal flow noted in both subclavian arteries. _____________________________________________________________________________ __ QUALITY/PROCEDURE Extracranial Carotid Duplex (35329). [id number]. RIGHT VELOCITIES The velocities in the right common, internal carotid artery, and external carotid arteries are all within normal limits. RIGHT EXTRACRANIAL There is no significant atherosclerotic plaque noted in the right common carotid artery. There is no significant atherosclerotic plaque noted in the right bifurcation. There is no significant atherosclerotic plaque noted in the right internal carotid artery. No significant stenosis noted in the internal carotid artery. Antegrade flow is noted in the right vertebral artery. Normal flow noted in the subclavian artery. LEFT VELOCITIES The velocities in the left common, internal carotid artery, and external carotid arteries are all within normal limits. LEFT EXTRACRANIAL There is no significant atherosclerotic plaque noted in the left common carotid artery. There is no significant atherosclerotic plaque noted in the left bifurcation. There is no significant atherosclerotic plaque noted in the left internal carotid artery. No significant stenosis noted in the internal carotid artery. Antegrade flow is noted in the left vertebral artery. Normal flow noted in the subclavian artery. Measurements and Calculations                               Right         No         Left    Unit  Laterality Prox CCA PSV                  -78.6                     85.0    cm/sec Prox CCA EDV                  -25.2                    28.1    cm/sec Dist CCA PSV                  -75.0                    -78.0   cm/sec Dist CCA EDV                  -29.9                    -21.1   cm/sec CCA ratio vel                 -78.0                    84.4    cm/sec Prox ECA PSV                  -66.8                    -59.2   cm/sec Prox ECA EDV                  -18.8                    -13.8   cm/sec Bulb PSV                       70.9                    59.8    cm/sec Bulb EDV                       22.3                    21.1    cm/sec Prox ICA PSV                  -82.1                    -71.5   cm/sec Prox ICA EDV                  -34.0                    -28.1   cm/sec Mid ICA PSV                   -65.7                    -59.8   cm/sec Mid ICA EDV                   -34.3                    -28.7   cm/sec Dist ICA PSV                  -  73.9                    -62.5   cm/sec Dist ICA EDV                  -30.0                    -26.6   cm/sec ICA ratio vel                 -81.5                    -70.9   cm/sec Vertebral A PSV                38.1                    46.5    cm/sec Vertebral A EDV                16.4                    19.4    cm/sec ICA/CCA ratio                  1.0                     -0.84 Prox SCLA PSV                 -82.7                    145.4   cm/sec Electronically signed byDr. Judithe Modest, M.D   05/16/2019 03:34 PM        Adult Echocardiogram Report  05/16/19  ??  Name: KAORU, REZENDES Date: 05/16/2019 09:29 AM  MRN: [id number]                 Patient Location: GN^FA21^HY86^VHQI  DOB: 1946-03-22             Age: 65 yrs  Height: 68 in               Weight: 200 lb                                                                     BSA: 2.0   m2  BP: 118/77 mmHg             HR: 97  Gender: Male                Account #: [id number]  Reason For Study: Near syncope  History: Atrial fibrillation; DM; Hypercholestremia  Ordering  Physician: Charlaine Dalton  Performed By: Dutch Quint, RDCS  ??  Interpretation Summary  Technically difficult study with fair images; Definity echocontrast used to  enhance quality of images.  ??  1. Left ventricular systolic function is normal. No regional wall motion  abnormalities noted. The estimated left ventricular ejection fraction is 55%.  2. The left ventricle is normal in size with normal wall thickness.  3. Indeterminate diastolic function.  4. The right ventricle is normal size with normal function.  5. There is no hemodynamically significant valvular  pathology.  6. Based on the peak tricuspid regurgitation velocity the estimated right  ventricular systolic pressure is 29 mmHg.  7. Normal size of the left and right atrium.  8. 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  ??  Other findings noted below. Previous report dated 07/05/17 noted: EF of 61%.  ??  Left Ventricle  The left ventricle is normal in size. There is no thrombus. There is normal  left ventricular wall thickness. Left ventricular systolic function is   normal.  The estimated left ventricular ejection fraction is 55%. Indeterminate  diastolic function. No regional wall motion abnormalities noted.  ??  Right Ventricle  The right ventricle is normal size. There is normal right ventricular wall  thickness. Right ventricular systolic function is normal: TAPSE: 2.6cm.  ??  Atria  The left atrial size is normal. Left atrial volume index: 30ml/m2. Right  atrial size is normal. Right atrial volume index 11ml/m2. Mild left-to-right  flow, with a gradient measured at 7mmhg, seen crossing the interatrial   septum  at the level of the fossa ovalis consistent with the presence of a small  patent foramen ovalve (PFO) vs ASD.  ??  ??  Mitral Valve  Structurally normal mitral valve. Mitral valve  prolapse cannot be excluded.  There is no mitral valve stenosis. There is no mitral regurgitation noted.  ??  Tricuspid Valve  The tricuspid valve is normal. There is no tricuspid valve prolapse. There is  no tricuspid stenosis. There is trace tricuspid regurgitation. Based on the  peak tricuspid regurgitation velocity the estimated right ventricular   systolic  pressure is 29 mmHg.  ??  Aortic Valve  The aortic valve is normal in structure and function. There is no aortic  valvular vegetation. No aortic stenosis. No aortic regurgitation is present.  ??  Pulmonic Valve  The pulmonic valve leaflets are thin and pliable; valve motion is normal.  There is no pulmonic valvular stenosis. There is no pulmonic valvular  regurgitation.  ??  Great Vessels  The aortic root is normal size.  ??  ??  Pericardium/Pleural  There is no pericardial effusion.      Physical Exam on Discharge:  Visit Vitals  BP 96/64 (BP 1 Location: Left arm, BP Patient Position: Sitting)   Pulse 68   Temp 98.3 ??F (36.8 ??C)   Resp 18   Ht 5\' 8"  (1.727 m)   Wt 91.5 kg (201 lb 11.5 oz)   SpO2 98%   BMI 30.67 kg/m??       General: Alert, CM, appears reported age, pleasant to conversation, in no acute distress  CV: Regular rate and rhythm, no murmurs, rubs, gallops  Pulm: Lungs clear to auscultation bilaterally, no focal wheezes, crackles, rhonchi  GI: Soft, nontender, nondistended, normal bowel sounds  Extremity: No clubbing, cyanosis, no pitting lower leg edema, capillary refill time intact to distal fingertips  Skin: Warm, dry    Most Recent BMP and CBC:    Lab Results   Component Value Date/Time    Sodium 140 05/19/2019 05:51 AM    Potassium 4.2 05/19/2019 05:51 AM    Chloride 111 (H) 05/19/2019 05:51 AM    CO2 27 05/19/2019 05:51 AM    Anion gap 2 (L) 05/19/2019 05:51 AM    Glucose 126 (H) 05/19/2019 05:51 AM  BUN 17 05/19/2019 05:51 AM    Creatinine 0.9 05/19/2019 05:51 AM    GFR est AA >60 05/19/2019 05:51 AM    GFR est non-AA >60 05/19/2019 05:51 AM     Calcium 8.8 05/19/2019 05:51 AM      Lab Results   Component Value Date/Time    WBC 8.5 05/19/2019 05:51 AM    HGB 12.3 (L) 05/19/2019 05:51 AM    HCT 36.5 (L) 05/19/2019 05:51 AM    PLATELET 191 05/19/2019 05:51 AM    MCV 92.9 05/19/2019 05:51 AM          Condition at discharge: Stable.     Disposition:  Home      PCP:  Maylon Peppers, MD, F/up in one week    F/up with [facility], Dr. Jillene Bucks or colleague per their instructions.      Total time for DC was 28 minutes.      Marzella Schlein, MD  May 19, 2019  17:03

## 2019-05-19 NOTE — Progress Notes (Signed)
 Current discharge plan is home with no needs.  Patient will choose a home health agency at discharge if needed.  Continue to monitor.      Patient admitted on 05/15/2019 from home with   Chief Complaint   Patient presents with   . Irregular Heart Beat   . Dizziness          The patient is being treated for    PMH:   Past Medical History:   Diagnosis Date   . A-fib (HCC)    . Benign prostatic hyperplasia with lower urinary tract symptoms    . BPH (benign prostatic hypertrophy) with urinary obstruction    . Diabetes mellitus (HCC)    . Elevated prostate specific antigen (PSA)    . Enlarged prostate with lower urinary tract symptoms (LUTS)    . History of needle biopsy of prostate with negative result    . History of urinary retention    . Hypercholesteremia    . Hypertrophy of prostate with urinary obstruction and other lower urinary tract symptoms (LUTS)    . 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 Urinary retention    . UTI (lower urinary tract infection)         Treatment Team: Treatment Team: Attending Provider: Daneen Pac, MD; Consulting Provider: Lonnie Passy, MD; Consulting Provider: Tobie Mac SAUNDERS, MD; Consulting Provider: Sposato, Joseph J, DO; Hospitalist: Daneen Pac, MD      The patient has been admitted to the hospital 0 times in the past 12 months.    Previous 4 Admission Dates Admission and Discharge Diagnosis Interventions Barriers Disposition                                 Patient and Family/Caregivers Goals of Care:   Home independent    Caregivers Participating in Plan of Care/Discharge Plan with the patient:   Yes - daughter - Foster Dross    Tentative dc plan:   Home with no needs    Anticipated DME needs for discharge:   None    PRESCREENING COMPLETED FOR SNF:  N/A    Does the patient have appropriate clothing available to be worn at discharge?   yes    The  patient and care participants are willing to travel  N/A area for discharge facility.  The patient and plan of care participants have been provided with a list of all available Rehab Facilities or Home Health agencies as applicable. CM will follow up with a list of facilities or agencies that are offer acceptance.    CM has disclosed any financial interest that [facility] may have with any facility or agency.    Anticipated Discharge Date:   05/20/2019    The plan of care and discharge plan has been discussed with Daneen Pac, MD and all other appropriate providers and adjusted per interdisciplinary team recommendations and in discussion with the patient and the patient designated Care Plan Participants.    Barriers to Healthcare Success/ Readmission Risk Factors:   none    Consults:  Palliative Care Consult Recommended:   no  Transitional Care Clinic Referral:   no  Transitional Nurse Navigator Referral:   no  Oncology Navigator Referral:   no  SW consulted:   no  [facility]  Consulted:   N/A  Outside Hospital/Community Resources Referrals and Collaboration:   none    Food/Nutrition Needs:   None                   Dietician Consulted:   N/A    RRAT Score: Low Risk            9       Total Score        5 Pt. Coverage (Medicare=5 , Medicaid, or Self-Pay=4)    4 Charlson Comorbidity Score (Age + Comorbid Conditions)        Criteria that do not apply:    Has Seen PCP in Last 6 Months (Yes=3, No=0)    Married. Living with Significant Other. Assisted Living. LTAC. SNF. or   Rehab    Patient Length of Stay (>5 days = 3)    IP Visits Last 12 Months (1-3=4, 4=9, >4=11)           PCP: Brien Cherene CROME, MD . How do you get to your doctor appointments?    Specialists:   Cardiology    Dialysis Unit:   N/A    Pharmacy:   [facility] - [place], [place]. Are there any medications that you have trouble paying for? Any difficulty getting your medications?  None    DME available at Home:  None    Home O2 L Flow:   None            Home O2 Provider:   N/A    Home Environment and Prior Level of Function: Lives at [address]  [facility] [place] [address] @HOMEPHONE @. Lives with adult daughter and her family in a multistory home.  There are 4 steps outside the home.    Responsibilities at home include   ADLs    Prior to admission open services:   None    Home Health Agency:  N/A  Personal Care Agency:  N/A    Extended Emergency Contact Information  Primary Emergency Contact: Hancock,Krissy E  Address: [address]           [place], [place] [address] [place]  Home Phone: [phone]  Mobile Phone: [phone]  Relation: Daughter  Secondary Emergency Contact: Foster Dross  Home Phone: [phone]  Relation: Child     Transportation:   family will transport home    Therapy Recommendations:    OT =   N/A    PT =   N/A    SLP =  N/A     RT Home O2 Evaluation =  N/A    Wound Care =  N/A    Case Management Assessment    ABUSE/NEGLECT SCREENING   Physical Abuse/Neglect: Denies   Sexual Abuse: Denies   Sexual Abuse: Denies   Other Abuse/Issues: Denies          PRIMARY DECISION MAKER                                   CARE MANAGEMENT INTERVENTIONS   Readmission Interview Completed: Not Applicable   PCP Verified by CM: Yes(Dr. Debby Seip)   Last Visit to PCP: 05/22/19(patient has not seen M.D. yet)       Mode of Transport at Discharge: Other (see comment)(family can pick up patient for discharge)       Transition of Care Consult (CM Consult): Other(patient will choose a HH agency at  discharge if needed)       Reason Outside Comfort Care Chosen: Out of service area       Discharge Durable Medical Equipment: No               Current Support Network: Other(lives with adult daughter and her family)   Reason for Referral: DCP Rounds   History Provided By: Patient   Patient Orientation: Alert and Oriented, Person, Place, Situation, Self   Cognition: Alert   Support System Response: Unavailable   Previous Living Arrangement: Lives with Family  Independent   Home Accessibility: Multi Level Home, Steps(4 steps outside - full bath on the first floor)   Prior Functional Level: Independent in ADLs/IADLs   Current Functional Level: Independent in ADLs/IADLs       Can patient return to prior living arrangement: Yes   Ability to make needs known:: Good   Family able to assist with home care needs:: Yes   Pets: Dog   Needs help with pets while hospitalized: No               Confirm Follow Up Transport: Self   Confirm Transport and Arrange: No              DISCHARGE LOCATION   Discharge Placement: Home

## 2019-05-19 NOTE — Progress Notes (Signed)
Current discharge plan is home with no needs.  Patient will choose a home health agency at discharge if needed.  Continue to monitor.      Patient admitted on 05/15/2019 from home with   Chief Complaint   Patient presents with   ??? Irregular Heart Beat   ??? Dizziness          The patient is being treated for    PMH:   Past Medical History:   Diagnosis Date   ??? A-fib ([place])    ??? Benign prostatic hyperplasia with lower urinary tract symptoms    ??? BPH (benign prostatic hypertrophy) with urinary obstruction    ??? Diabetes mellitus ([place])    ??? Elevated prostate specific antigen (PSA)    ??? Enlarged prostate with lower urinary tract symptoms (LUTS)    ??? History of needle biopsy of prostate with negative result    ??? History of urinary retention    ??? Hypercholesteremia    ??? Hypertrophy of prostate with urinary obstruction and other lower urinary tract symptoms (LUTS)    ??? 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 Urinary retention    ??? UTI (lower urinary tract infection)         Treatment Team: Treatment Team: Attending Provider: Marzella Schlein, MD; Consulting Provider: Charlaine Dalton, MD; Consulting Provider: Oneita Hurt, MD; Consulting Provider: Martie Lee, DO; Hospitalist: Marzella Schlein, MD      The patient has been admitted to the hospital 0 times in the past 12 months.    Previous 4 Admission Dates Admission and Discharge Diagnosis Interventions Barriers Disposition                                 Patient and Family/Caregivers Goals of Care:   Home independent    Caregivers Participating in Plan of Care/Discharge Plan with the patient:   Yes - daughter - Benedetto Coons    Tentative dc plan:   Home with no needs    Anticipated DME needs for discharge:   None    PRESCREENING COMPLETED FOR SNF:  N/A    Does the patient have appropriate clothing available to be worn at discharge?   yes     The patient and care participants are willing to travel  N/A area for discharge facility.  The patient and plan of care participants have been provided with a list of all available Rehab Facilities or [place] agencies as applicable. CM will follow up with a list of facilities or agencies that are offer acceptance.    CM has disclosed any financial interest that [facility] may have with any facility or agency.    Anticipated Discharge Date:   05/20/2019    The plan of care and discharge plan has been discussed with Marzella Schlein, MD and all other appropriate providers and adjusted per interdisciplinary team recommendations and in discussion with the patient and the patient designated Care Plan Participants.    Barriers to Healthcare Success/ Readmission Risk Factors:   none    Consults:  Palliative Care Consult Recommended:   no  Transitional Care Clinic Referral:   no  Transitional Nurse Navigator Referral:   no  Oncology Navigator Referral:   no  SW consulted:   no  [facility]  Consulted:   N/A  Outside Hospital/Community Resources Referrals and Collaboration:   none    Food/Nutrition Needs:   None                   Dietician Consulted:   N/A    RRAT Score: Low Risk            9       Total Score        5 Pt. Coverage (Medicare=5 , Medicaid, or Self-Pay=4)    4 Charlson Comorbidity Score (Age + Comorbid Conditions)        Criteria that do not apply:    Has Seen PCP in Last 6 Months (Yes=3, No=0)    Married. Living with Significant Other. Assisted Living. LTAC. SNF. or   Rehab    Patient Length of Stay (>5 days = 3)    IP Visits Last 12 Months (1-3=4, 4=9, >4=11)           PCP: Maylon Peppers, MD . How do you get to your doctor appointments?    Specialists:   Cardiology    Dialysis Unit:   N/A    Pharmacy:   [facility] - [place], [place]. Are there any medications that you have trouble paying for? Any difficulty getting your medications?  None    DME available at Home:  None     Home O2 L Flow:  None            Home O2 Provider:   N/A    Home Environment and Prior Level of Function: Lives at [place] [address] @HOMEPHONE @. Lives with adult daughter and her family in a multistory home.  There are 4 steps outside the home.    Responsibilities at home include   ADLs    Prior to admission open services:   None    Home Health Agency:  N/A  Personal Care Agency:  N/A    Extended Emergency Contact Information  Primary Emergency Contact: Hancock,Krissy E  Address: [place] [address] [place] Phone: [phone]  Mobile Phone: [phone]  Relation: Daughter  Secondary Emergency Contact: [place] Phone: [phone]  Relation: Child     Transportation:   family will transport home    Therapy Recommendations:    OT =   N/A    PT =   N/A    SLP =  N/A     RT Home O2 Evaluation =  N/A    Wound Care =  N/A    Case Management Assessment    ABUSE/NEGLECT SCREENING   Physical Abuse/Neglect: Denies   Sexual Abuse: Denies   Sexual Abuse: Denies   Other Abuse/Issues: Denies          PRIMARY DECISION MAKER                                   CARE MANAGEMENT INTERVENTIONS   Readmission Interview Completed: Not Applicable   PCP Verified by CM: Yes(Dr. Alben Deeds)   Last Visit to PCP: 05/22/19(patient has not seen M.D. yet)       Mode of Transport at Discharge: Other (see comment)(family can pick up patient for discharge)       Transition of Care Consult (CM Consult): Other(patient will choose a [place] agency at  discharge if needed)       Reason Outside [place]: Out of service area       Discharge Durable Medical Equipment: No               Current Support Network: Other(lives with adult daughter and her family)   Reason for Referral: DCP Rounds   History Provided By: Patient   Patient Orientation: Alert and Oriented, Person, Place, Situation, Self   Cognition: Alert   Support System Response: Unavailable    Previous Living Arrangement: Lives with Family Independent   Home Accessibility: Multi Level Home, Steps(4 steps outside - full bath on the first floor)   Prior Functional Level: Independent in ADLs/IADLs   Current Functional Level: Independent in ADLs/IADLs       Can patient return to prior living arrangement: Yes   Ability to make needs known:: Good   Family able to assist with home care needs:: Yes   Pets: Dog   Needs help with pets while hospitalized: No               Confirm Follow Up Transport: Self   Confirm Transport and Arrange: No              DISCHARGE LOCATION   Discharge Placement: Home

## 2019-05-19 NOTE — Discharge Summary (Signed)
Hospitalist Discharge Summary      Discharge Summary   Admit Date: 05/15/2019  Discharge Date:  05/19/19      Patient ID:  [name]  73 y.o.  1946/01/09    Chief Complaint   Patient presents with   ??? Irregular Heart Beat   ??? Dizziness       Discharge Diagnoses    1.  Presyncope likely due to paroxysmal A. fib with rapid ventricular response and subsequent orthostasis  -Improved    2.  Paroxysmal atrial fibrillation/flutter  -Status post cardioversion with cardiology on 8/10  -Flecainide increased to 100 mg twice daily, metoprolol increased to 25 mg 3 times daily  -Losartan dose has been decreased to 12.5 mg daily  -Cardiology also recommends outpatient ZIO monitoring and if normal recommend loop monitoring.    3.  Type 2 diabetes  4.  Hyperlipidemia  5.  Hypertension      Current Discharge Medication List      CONTINUE these medications which have CHANGED    Details   flecainide (TAMBOCOR) 100 mg tablet Take 1 Tab by mouth every twelve (12) hours for 30 days. Indications: prevention of recurrent atrial fibrillation  Qty: 60 Tab, Refills: 2      losartan (COZAAR) 25 mg tablet Take 0.5 Tabs by mouth daily for 30 days. Indications: high blood pressure  Qty: 15 Tab, Refills: 2      metoprolol tartrate (LOPRESSOR) 25 mg tablet Take 1 Tab by mouth three (3) times daily for 30 days. Indications: ventricular rate control in atrial fibrillation  Qty: 90 Tab, Refills: 2         CONTINUE these medications which have NOT CHANGED    Details   simvastatin (ZOCOR) 20 mg tablet Take 1 Tab by mouth daily.      acetaminophen (TYLENOL) 325 mg tablet Take 650 mg by mouth as needed.      metFORMIN (GLUCOPHAGE) 1,000 mg tablet 1,000 mg two (2) times daily (with meals).      XARELTO 20 mg tab tablet Take 20 mg by mouth daily (with dinner).    Associated Diagnoses: Elevated PSA; Microscopic hematuria         STOP taking these medications       metoprolol succinate (TOPROL-XL) 25 mg XL tablet Comments:   Reason for Stopping:                  Initial presentation:  From HPI:  [name] is a 73 y.o. WHITE OR CAUC[place]IAN male who presents with dizziness, lightheadedness since 330 this afternoon  Patient this evening had a near syncope episode felt like he was going to pass out however denies losing consciousness  Denies any chest pain, shortness of breath palpitation nausea vomiting, has history of atrial fibrillation, is on flecainide the last 2 months  Patient has history of A. fib, recently moved back to this area after having gone to [place]  for a job, said few weeks ago had another episodes of A. fib, following which his flecainide was increased to twice a day  He said he intermittently gets this feeling of palpitations where he can tell his heart rate is going fast, along with some chest discomfort but never had any chest pain  He continues to take Xarelto, beta-blocker    Patient was admitted to telemetry with plans to monitor, continue beta-blocker, trend troponin, check ART echocardiogram, and cardiology consultation.    Hospital Course:   Cardiology evaluated on the seventh  noting symptomatic paroxysmal atrial fibrillation intermittent atypical atrial flutter and RVR with recurrence on low-dose flecainide.  Plan increase flecainide to 100 mg twice daily and if patient has continued rapid response to plan for direct-current cardioversion on Monday.  Also plan to increase metoprolol and decrease losartan to avoid hypotension.  Echocardiogram was performed with findings as described below.  Patient was continued on Xarelto.  Patient remained hospitalized over the weekend awaiting cardioversion on Monday.  Cardiology performed cardioversion on the 10th with successful cardioversion to sinus rhythm.  Recommend to continue flecainide and 100 mg twice daily with metoprolol 3 times daily and continue Xarelto.  The patient reports feeling improved since this time and is otherwise plan for discharge today.     Procedures: Direct-current cardioversion on 8/10 with Dr. Jillene Bucks.    Procedure:   1) External cardioversion (09323)   ??  Indication: Symptomatic atrial fibrillation  ??  Informed consent was obtained after full discussion of all risks/benefits/complications and alternatives.  Please see full chart for further details.  ??  Patient received anesthesia as per anesthesia records.  ??  Initial rhythm: Atrial fibrillation  Patient received 1 synchronized biphasic shock(s) at 200 J.  Final Rhythm was Sinus rhythm  ??  12 lead ECG: Sinus rhythm  ??  Impression:  1. Successful DC cardioversion of atrial fibrillation to sinus rhythm  ??  Recs:  1. Continue Flecainide 100 mg BID and Metoprolol.  2. Continue Xarelto 20  Mg daily.  3. Discharge later today.      Consultants:   Dr. [name] and colleagues of Cardiology.    Imaging:  Xr Chest Sngl V    Result Date: 05/15/2019  EXAM:  Chest AP INDICATIONS: palpitations, lightheadedness, dizziness, irregular heart rate COMPARISON: Most recently 07/05/2017. FINDINGS: Chronic blunting of the left costophrenic angle, unchanged. No consolidation, pleural effusion, or pulmonary edema. No pneumothorax. Normal cardiomediastinal contours.     IMPRESSION: No acute cardiopulmonary process.         Duplex Carotid Bilateral    Result Date: 05/16/2019                                                                Study ID: [facility]                                           [id number] [place],                                         [place]                              Carotid Duplex Report Name: Darius, [name] Date: 05/16/2019 08:28 AM MRN: [id number]                      Patient Location: JY^NW29^FA21^HYQM DOB: 1945-11-27                  Age: 43 yrs Gender: Male                     Account #: [id number] Reason For Study: Syncope. Ordering Physician: Charlaine Dalton Referring Physician: Charlaine Dalton Performed By: Patsey Berthold, RVT Interpretation Summary No evidence of stenosis noted in the internal carotid artery bilaterally. Antegrade flow noted in both vertebral arteries. Normal flow noted in both subclavian arteries. _____________________________________________________________________________ __ QUALITY/PROCEDURE Extracranial Carotid Duplex (57846). [id number].[id number]. RIGHT VELOCITIES The velocities in the right common, internal carotid artery, and external carotid arteries are all within normal limits. RIGHT EXTRACRANIAL There is no significant atherosclerotic plaque noted in the right common carotid artery. There is no significant atherosclerotic plaque noted in the right bifurcation. There is no significant atherosclerotic plaque noted in the right internal carotid artery. No significant stenosis noted in the internal carotid artery. Antegrade flow is noted in the right vertebral artery. Normal flow noted in the subclavian artery. LEFT VELOCITIES The velocities in the left common, internal carotid artery, and external carotid arteries are all within normal limits. LEFT EXTRACRANIAL There is no significant atherosclerotic plaque noted in the left common carotid artery. There is no significant atherosclerotic plaque noted in the left bifurcation. There is no significant atherosclerotic plaque noted in the left internal carotid artery. No significant stenosis noted in the internal carotid artery. Antegrade flow is noted in the left vertebral artery. Normal flow noted in the subclavian artery. Measurements and Calculations                                Right         No         Left    Unit  Laterality Prox CCA PSV                  -78.6                    85.0    cm/sec Prox CCA EDV                  -25.2                    28.1    cm/sec Dist CCA PSV                  -75.0                    -78.0   cm/sec Dist CCA EDV                  -29.9                    -21.1   cm/sec CCA ratio vel                 -78.0                    84.4    cm/sec Prox ECA PSV                  -66.8                    -59.2   cm/sec Prox ECA EDV                  -18.8                    -13.8   cm/sec Bulb PSV                       70.9                    59.8    cm/sec Bulb EDV                       22.3                    21.1    cm/sec Prox ICA PSV                  -82.1                    -71.5   cm/sec Prox ICA EDV                  -34.0                    -28.1   cm/sec Mid ICA PSV                   -65.7                    -59.8   cm/sec Mid ICA EDV                   -34.3                    -28.7   cm/sec Dist ICA PSV                  -  73.9                    -62.5   cm/sec Dist ICA EDV                  -30.0                    -26.6   cm/sec ICA ratio vel                 -81.5                    -70.9   cm/sec Vertebral A PSV                38.1                    46.5    cm/sec Vertebral A EDV                16.4                    19.4    cm/sec ICA/CCA ratio                  1.0                     -0.84 Prox SCLA PSV                 -82.7                    145.4   cm/sec Electronically signed byDr. Judithe Modest, M.D   05/16/2019 03:34 PM        Adult Echocardiogram Report  05/16/19  ??  Name: SAHAND, GOSCH Date: 05/16/2019 09:29 AM  MRN: [id number]                 Patient Location: AO^ZH08^MV78^IONG  DOB: 03/23/1946             Age: 17 yrs  Height: 68 in               Weight: 200 lb                                                                     BSA: 2.0   m2  BP: 118/77 mmHg             HR: 97   Gender: Male                Account #: [id number]  Reason For Study: Near syncope  History: Atrial fibrillation; DM; Hypercholestremia  Ordering Physician: Charlaine Dalton  Performed By: Dutch Quint, RDCS  ??  Interpretation Summary  Technically difficult study with fair images; Definity echocontrast used to  enhance quality of images.  ??  1. Left ventricular systolic function is normal. No regional wall motion  abnormalities noted. The estimated left ventricular ejection fraction is 55%.  2. The left ventricle is normal in size with normal wall thickness.  3. Indeterminate diastolic function.  4. The right ventricle is normal size with normal function.  5. There is no hemodynamically significant valvular  pathology.  6. Based on the peak tricuspid regurgitation velocity the estimated right  ventricular systolic pressure is 29 mmHg.  7. Normal size of the left and right atrium.  8. 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  ??  Other findings noted below. Previous report dated 07/05/17 noted: EF of 61%.  ??  Left Ventricle  The left ventricle is normal in size. There is no thrombus. There is normal  left ventricular wall thickness. Left ventricular systolic function is   normal.  The estimated left ventricular ejection fraction is 55%. Indeterminate  diastolic function. No regional wall motion abnormalities noted.  ??  Right Ventricle  The right ventricle is normal size. There is normal right ventricular wall  thickness. Right ventricular systolic function is normal: TAPSE: 2.6cm.  ??  Atria  The left atrial size is normal. Left atrial volume index: 80ml/m2. Right  atrial size is normal. Right atrial volume index 72ml/m2. Mild left-to-right  flow, with a gradient measured at 70mmhg, seen crossing the interatrial   septum   at the level of the fossa ovalis consistent with the presence of a small  patent foramen ovalve (PFO) vs ASD.  ??  ??  Mitral Valve  Structurally normal mitral valve. Mitral valve prolapse cannot be excluded.  There is no mitral valve stenosis. There is no mitral regurgitation noted.  ??  Tricuspid Valve  The tricuspid valve is normal. There is no tricuspid valve prolapse. There is  no tricuspid stenosis. There is trace tricuspid regurgitation. Based on the  peak tricuspid regurgitation velocity the estimated right ventricular   systolic  pressure is 29 mmHg.  ??  Aortic Valve  The aortic valve is normal in structure and function. There is no aortic  valvular vegetation. No aortic stenosis. No aortic regurgitation is present.  ??  Pulmonic Valve  The pulmonic valve leaflets are thin and pliable; valve motion is normal.  There is no pulmonic valvular stenosis. There is no pulmonic valvular  regurgitation.  ??  Great Vessels  The aortic root is normal size.  ??  ??  Pericardium/Pleural  There is no pericardial effusion.      Physical Exam on Discharge:  Visit Vitals  BP 96/64 (BP 1 Location: Left arm, BP Patient Position: Sitting)   Pulse 68   Temp 98.3 ??F (36.8 ??C)   Resp 18   Ht 5\' 8"  (1.727 m)   Wt 91.5 kg (201 lb 11.5 oz)   SpO2 98%   BMI 30.67 kg/m??       General: Alert, CM, appears reported age, pleasant to conversation, in no acute distress  CV: Regular rate and rhythm, no murmurs, rubs, gallops  Pulm: Lungs clear to auscultation bilaterally, no focal wheezes, crackles, rhonchi  GI: Soft, nontender, nondistended, normal bowel sounds  Extremity: No clubbing, cyanosis, no pitting lower leg edema, capillary refill time intact to distal fingertips  Skin: Warm, dry    Most Recent BMP and CBC:    Lab Results   Component Value Date/Time    Sodium 140 05/19/2019 05:51 AM    Potassium 4.2 05/19/2019 05:51 AM    Chloride 111 (H) 05/19/2019 05:51 AM    CO2 27 05/19/2019 05:51 AM    Anion gap 2 (L) 05/19/2019 05:51 AM     Glucose 126 (H) 05/19/2019 05:51 AM  BUN 17 05/19/2019 05:51 AM    Creatinine 0.9 05/19/2019 05:51 AM    GFR est AA >60 05/19/2019 05:51 AM    GFR est non-AA >60 05/19/2019 05:51 AM    Calcium 8.8 05/19/2019 05:51 AM      Lab Results   Component Value Date/Time    WBC 8.5 05/19/2019 05:51 AM    HGB 12.3 (L) 05/19/2019 05:51 AM    HCT 36.5 (L) 05/19/2019 05:51 AM    PLATELET 191 05/19/2019 05:51 AM    MCV 92.9 05/19/2019 05:51 AM          Condition at discharge: Stable.     Disposition:  Home      PCP:  Maylon Peppers, MD, F/up in one week    F/up with [facility], Dr. Jillene Bucks or colleague per their instructions.      Total time for DC was 28 minutes.      Marzella Schlein, MD  May 19, 2019  17:03

## 2019-05-19 NOTE — Procedures (Signed)
Direct Current Cardioversion Procedure Note    Procedure:   1) External cardioversion (83094)     Indication: Symptomatic atrial fibrillation    Informed consent was obtained after full discussion of all risks/benefits/complications and alternatives.  Please see full chart for further details.    Patient received anesthesia as per anesthesia records.    Initial rhythm: Atrial fibrillation  Patient received 1 synchronized biphasic shock(s) at 200 J.  Final Rhythm was Sinus rhythm    12 lead ECG: Sinus rhythm    Impression:  1. Successful DC cardioversion of atrial fibrillation to sinus rhythm    Recs:  1. Continue Flecainide 100 mg BID and Metoprolol.  2. Continue Xarelto 20  Mg daily.  3. Discharge later today.

## 2019-05-19 NOTE — Anesthesia Pre-Procedure Evaluation (Addendum)
Relevant Problems   No relevant active problems       Anesthetic History   No history of anesthetic complications            Review of Systems / Medical History  Patient summary reviewed, nursing notes reviewed and pertinent labs reviewed    Pulmonary  Within defined limits                 Neuro/Psych         Neuromuscular disease (peripheral neuropathy)    Comments: Chronic fatigue Cardiovascular            Dysrhythmias : atrial fibrillation  Hyperlipidemia    Exercise tolerance: >4 METS  Comments: 8/7 echo:  1. Left ventricular systolic function is normal. No regional wall motion  abnormalities noted. The estimated left ventricular ejection fraction is 55%.  2. The left ventricle is normal in size with normal wall thickness.  3. Indeterminate diastolic function.  4. The right ventricle is normal size with normal function.  5. There is no hemodynamically significant valvular pathology.  6. Based on the peak tricuspid regurgitation velocity the estimated right  ventricular systolic pressure is 29 mmHg.  7. Normal size of the left and right atrium.  8. Mild left-to-right flow, with a gradient measured at 25mmhg, seen crossing  the interatrial septum at the level of the fossa ovalis consistent with the  presence of a small patent foramen ovale (PFO) vs ASD.   GI/Hepatic/Renal     GERD    Renal disease       Endo/Other    Diabetes    Cancer (prostate) and anemia     Other Findings            Physical Exam    Airway  Mallampati: II  TM Distance: 4 - 6 cm  Neck ROM: normal range of motion   Mouth opening: Normal     Cardiovascular  Regular rate and rhythm,  S1 and S2 normal,  no murmur, click, rub, or gallop             Dental  No notable dental hx       Pulmonary  Breath sounds clear to auscultation               Abdominal  GI exam deferred       Other Findings            Anesthetic Plan    ASA: 3  Anesthesia type: general            Anesthetic plan and risks discussed with: Patient

## 2019-05-19 NOTE — Other (Addendum)
----------  DocumentID: [id number]------------------------------------------------              [facility]                       Patient Education Report         Name: AVEION, NGUYEN                  Date: 05/16/2019    MRN: [id number]                    Time: 6:18:57 PM         Patient ordered video: 'Patient Safety: Stay Safe While you are in the Hospital'    from [id number]_[email or web address]_[id number] via phone number: [phone] at 6:18:57 PM    Description: This program outlines some of the precautions patients can take to ensure a speedy recovery without extra complications. The video emphasizes the importance of communicating with the healthcare team.    ----------DocumentID: [id number]------------------------------------------------                       [facility]          Patient Education Report - Discharge Summary        Date: 05/19/2019   Time: 6:14:43 PM   Name: CORDAI, RODRIGUE   MRN: [id number]      Account Number: [id number]      Education History:        Patient ordered video: 'Patient Safety: Stay Safe While you are in the Hospital' from [id number]_2227_1 on 05/16/2019 06:18:57 PM

## 2019-05-19 NOTE — Other (Signed)
TRANSFER - OUT REPORT:    Verbal report given to [place] on [name]  being transferred to [address] [place] for routine progression of care       Report consisted of patient???s Situation, Background, Assessment and   Recommendations(SBAR).     Information from the following report(s) Procedure Summary was reviewed with the receiving nurse.    Opportunity for questions and clarification was provided.      Patient transported with:   Registered Nurse

## 2019-05-19 NOTE — Progress Notes (Signed)
[facility] CARDIOLOGY PROGRESS NOTE    Patient: [name] Age: 73 y.o. Sex: male    Date of Birth: Jul 07, 1946 Admit Date: 05/15/2019 PCP: Maylon Peppers, MD   MRN: [id number]  CSN: [id number]         RECS:  ?? Continue flecainide 100 mg BID and metoprolol 25 mg TID  ?? Keep NPO for DCCV today.  ?? Hypertension is well controlled with the low dose losartan and TID dosing of metoprolol  ?? Continue xarelto for stroke risk reduction  ?? zio at outpt for presyncope evaluation to assure no tachy-brady events or ventricular arrhythmias.  ??  ASSESSMENT:  [name] is a 73 y.o. male  With afib  Remains in afib today with HR's in the 90-100's.  ??  Paroxysmal AF/AFL  ????????????????????????H/o DCCV (June 2020) in [place], [place]??  ????????????????????????H/o DCCV (06/2016) in [place], [place]  ????????????????????????On Flecainide & Toprol XL  ????????????????????????NST (Oct 2018): EF 68% with No Ischemia/Infarct   PFO  ????????????????????????Echo (Aug 2020): EF 55% with No WMA. ??NL R Heart. ??No signif. Valve Dz. ??RVSP 29. ??Mild L >??R flow (gradient 10) crossing IAS c/w ??????????Small PFO Vs. ASD  Chronic A/C (Xarelto)  Hypertension- stable and well controlled.  Hyperlipidemia- on zocor  Prostate CA  ????????????????????????S/p Prostatectomy (July 2017)  DM, type 2  BPH  Former Smoker  ??        [name], MD  [facility])  Pager [phone]    May 19, 2019  8:25 AM        Active Problems:    Near syncope (05/15/2019)        SUBJECTIVE:  No acute events overnight. The patient denies any chest pain or dyspnea.    VS:   Visit Vitals  BP 114/81 (BP 1 Location: Left arm, BP Patient Position: Supine)   Pulse 83   Temp 98.4 ??F (36.9 ??C)   Resp 18   Ht 5\' 8"  (1.727 m)   Wt 91.5 kg (201 lb 11.5 oz)   SpO2 97%   BMI 30.67 kg/m??     Last 3 Recorded Weights in this Encounter    05/17/19 0431 05/18/19 0706 05/19/19 0522   Weight: 90.4 kg (199 lb 4.7 oz) 91.8 kg (202 lb 6.1 oz) 91.5 kg (201 lb 11.5 oz)       Body mass index is 30.67 kg/m??.     Intake/Output Summary (Last 24 hours) at 05/19/2019 0825  Last data filed at 05/18/2019 1331  Gross per 24 hour   Intake 240 ml   Output ???   Net 240 ml       TELEMETRY personally reviewed by me: Atrial fibrillation with 90's bpm  EXAM:  General:  Awake, alert, not in distress  Neck: JVD is absent  Lungs: clear, no rales, no rhonchi   Cardiac:  Irregular Rhythm, No murmur, Gallops or Rubs  Abdomen: soft, nl bowel sounds  Extremities: Edema is absent. Warm extremities. No cyanosis.  Neuro: Alert and oriented; Nonfocal    LABORATORY TESTS:  Basic Metabolic Profile   Lab Results   Component Value Date/Time    NA 140 05/19/2019 05:51 AM    NA 141 05/18/2019 05:22 AM    NA 139 05/17/2019 07:00 AM    K 4.2 05/19/2019 05:51 AM    K 4.4 05/18/2019 05:22 AM    K 4.7 05/17/2019 07:00 AM    CL 111 (H) 05/19/2019 05:51 AM    CL  111 (H) 05/18/2019 05:22 AM    CL 109 (H) 05/17/2019 07:00 AM    CO2 27 05/19/2019 05:51 AM    CO2 28 05/18/2019 05:22 AM    CO2 29 05/17/2019 07:00 AM    BUN 17 05/19/2019 05:51 AM    BUN 19 05/18/2019 05:22 AM    BUN 13 05/17/2019 07:00 AM    CREA 0.9 05/19/2019 05:51 AM    CREA 0.8 05/18/2019 05:22 AM    CREA 0.8 05/17/2019 07:00 AM    GLU 126 (H) 05/19/2019 05:51 AM    GLU 116 (H) 05/18/2019 05:22 AM    GLU 121 (H) 05/17/2019 07:00 AM    CA 8.8 05/19/2019 05:51 AM    CA 8.8 05/18/2019 05:22 AM    CA 8.8 05/17/2019 07:00 AM    MG 1.8 02/05/2017 11:55 AM    MG 1.6 (L) 07/14/2014 05:14 PM    PHOS 2.5 02/05/2017 11:55 AM        CBC w/Diff    Lab Results   Component Value Date/Time    WBC 8.5 05/19/2019 05:51 AM    WBC 7.6 05/18/2019 05:22 AM    WBC 8.0 05/17/2019 07:00 AM    RBC 3.93 05/19/2019 05:51 AM    RBC 3.91 05/18/2019 05:22 AM    RBC 3.82 05/17/2019 07:00 AM    HGB 12.3 (L) 05/19/2019 05:51 AM    HGB 12.0 (L) 05/18/2019 05:22 AM    HGB 11.8 (L) 05/17/2019 07:00 AM    HCT 36.5 (L) 05/19/2019 05:51 AM    HCT 36.3 (L) 05/18/2019 05:22 AM     HCT 35.3 (L) 05/17/2019 07:00 AM    MCV 92.9 05/19/2019 05:51 AM    MCV 92.8 05/18/2019 05:22 AM    MCV 92.4 05/17/2019 07:00 AM    MCH 31.3 05/19/2019 05:51 AM    MCH 30.7 05/18/2019 05:22 AM    MCH 30.9 05/17/2019 07:00 AM    MCHC 33.7 05/19/2019 05:51 AM    MCHC 33.1 05/18/2019 05:22 AM    MCHC 33.4 05/17/2019 07:00 AM    PLT 191 05/19/2019 05:51 AM    PLT 180 05/18/2019 05:22 AM    PLT 173 05/17/2019 07:00 AM    Lab Results   Component Value Date/Time    GRANS 44.7 05/16/2019 03:35 AM    GRANS 48.9 05/15/2019 07:25 PM    GRANS 47.8 07/05/2017 03:50 AM    LYMPH 42.5 05/16/2019 03:35 AM    LYMPH 37.1 05/15/2019 07:25 PM    LYMPH 37.4 07/05/2017 03:50 AM    MONOS 7.0 05/16/2019 03:35 AM    MONOS 7.2 05/15/2019 07:25 PM    MONOS 9.3 07/05/2017 03:50 AM    EOS 4.9 05/16/2019 03:35 AM    EOS 5.7 (H) 05/15/2019 07:25 PM    EOS 4.5 07/05/2017 03:50 AM    BASOS 0.6 05/16/2019 03:35 AM    BASOS 0.8 05/15/2019 07:25 PM    BASOS 0.6 07/05/2017 03:50 AM        Cardiac Enzymes   Lab Results   Component Value Date/Time    CPK 58 07/16/2014 12:36 PM    CPK 54 07/16/2014 04:03 AM    CPK 56 07/15/2014 04:14 PM    CKMB 1.3 07/16/2014 12:36 PM    CKMB 0.9 07/16/2014 04:03 AM    CKMB 0.9 07/15/2014 04:14 PM        Coagulation   No results found for: PTP, INR, APTT, INREXT     MEDICATIONS:  Current Facility-Administered Medications   Medication Dose Route Frequency   ??? insulin glargine (LANTUS) injection 1-100 Units  1-100 Units SubCUTAneous QHS   ??? insulin lispro (HUMALOG) injection 1-100 Units  1-100 Units SubCUTAneous AC&HS   ??? flecainide (TAMBOCOR) tablet 100 mg  100 mg Oral Q12H   ??? losartan (COZAAR) tablet 12.5 mg  12.5 mg Oral DAILY   ??? metoprolol tartrate (LOPRESSOR) tablet 25 mg  25 mg Oral TID   ??? rivaroxaban (XARELTO) tablet 20 mg  20 mg Oral DAILY WITH DINNER   ??? sodium chloride (NS) flush 5-10 mL  5-10 mL IntraVENous Q8H   ??? simvastatin (ZOCOR) tablet 20 mg  20 mg Oral QHS

## 2019-07-23 ENCOUNTER — Observation Stay: Admit: 2019-07-23 | Payer: MEDICARE | Primary: Internal Medicine

## 2019-07-23 ENCOUNTER — Inpatient Hospital Stay
Admit: 2019-07-23 | Discharge: 2019-07-23 | Disposition: A | Discharge status: Home / Self Care | Payer: MEDICARE | Attending: Emergency Medicine

## 2019-07-23 DIAGNOSIS — I480 Paroxysmal atrial fibrillation: Secondary | ICD-10-CM

## 2019-07-23 LAB — CBC WITH AUTO DIFFERENTIAL
Basophils %: 0.8 % (ref 0–3)
Eosinophils %: 3.9 % (ref 0–5)
Hematocrit: 38 % (ref 37.0–50.0)
Hemoglobin: 12.5 gm/dl (ref 12.4–17.2)
Immature Granulocytes: 0.4 % (ref 0.0–3.0)
Lymphocytes %: 33.6 % (ref 28–48)
MCH: 29.9 pg (ref 23.0–34.6)
MCHC: 32.9 gm/dl (ref 30.0–36.0)
MCV: 90.9 fL (ref 80.0–98.0)
MPV: 11 fL — ABNORMAL HIGH (ref 6.0–10.0)
Monocytes %: 7.6 % (ref 1–13)
Neutrophils %: 53.7 % (ref 34–64)
Nucleated RBCs: 0 (ref 0–0)
Platelets: 178 10*3/uL (ref 140–450)
RBC: 4.18 M/uL (ref 3.80–5.70)
RDW-SD: 41.4 (ref 35.1–43.9)
WBC: 7.3 10*3/uL (ref 4.0–11.0)

## 2019-07-23 LAB — EKG 12-LEAD
Atrial Rate: 258 {beats}/min
Q-T Interval: 382 ms
QRS Duration: 94 ms
QTc Calculation (Bazett): 464 ms
R Axis: 76 degrees
T Axis: 93 degrees
Ventricular Rate: 89 {beats}/min

## 2019-07-23 LAB — BASIC METABOLIC PANEL
Anion Gap: 5 mmol/L (ref 5–15)
BUN: 20 mg/dl (ref 7–25)
CO2: 30 mEq/L (ref 21–32)
Calcium: 9.1 mg/dl (ref 8.5–10.1)
Chloride: 106 mEq/L (ref 98–107)
Creatinine: 0.9 mg/dl (ref 0.6–1.3)
EGFR IF NonAfrican American: >60
GFR African American: >60
Glucose: 154 mg/dl — ABNORMAL HIGH (ref 74–106)
Potassium: 4.1 mEq/L (ref 3.5–5.1)
Sodium: 140 mEq/L (ref 136–145)

## 2019-07-23 LAB — TROPONIN
Troponin I: <0.015 ng/ml (ref 0.000–0.045)
Troponin I: <0.015 ng/ml (ref 0.000–0.045)

## 2019-07-23 LAB — MAGNESIUM
Magnesium: 1.7 mg/dL (ref 1.6–2.6)
Magnesium: 1.7 mg/dl (ref 1.6–2.6)

## 2019-07-23 LAB — POCT GLUCOSE: POC Glucose: 137 mg/dL — ABNORMAL HIGH (ref 65–105)

## 2019-07-23 LAB — PROBNP, N-TERMINAL: BNP: 2804 pg/ml — ABNORMAL HIGH (ref 0.0–125.0)

## 2019-07-23 LAB — METABOLIC PANEL, BASIC
Anion gap: 5 mmol/L (ref 5–15)
BUN: 20 mg/dl (ref 7–25)
CO2: 30 mEq/L (ref 21–32)
Calcium: 9.1 mg/dl (ref 8.5–10.1)
Chloride: 106 mEq/L (ref 98–107)
Creatinine: 0.9 mg/dl (ref 0.6–1.3)
GFR est AA: >60
GFR est non-AA: >60
Glucose: 154 mg/dl — ABNORMAL HIGH (ref 74–106)
Potassium: 4.1 mEq/L (ref 3.5–5.1)
Sodium: 140 mEq/L (ref 136–145)

## 2019-07-23 LAB — CBC WITH AUTOMATED DIFF
BASOPHILS: 0.8 % (ref 0–3)
EOSINOPHILS: 3.9 % (ref 0–5)
HCT: 38 % (ref 37.0–50.0)
HGB: 12.5 gm/dl (ref 12.4–17.2)
IMMATURE GRANULOCYTES: 0.4 % (ref 0.0–3.0)
LYMPHOCYTES: 33.6 % (ref 28–48)
MCH: 29.9 pg (ref 23.0–34.6)
MCHC: 32.9 gm/dl (ref 30.0–36.0)
MCV: 90.9 fL (ref 80.0–98.0)
MONOCYTES: 7.6 % (ref 1–13)
MPV: 11 fL — ABNORMAL HIGH (ref 6.0–10.0)
NEUTROPHILS: 53.7 % (ref 34–64)
NRBC: 0 (ref 0–0)
PLATELET: 178 10*3/uL (ref 140–450)
RBC: 4.18 M/uL (ref 3.80–5.70)
RDW-SD: 41.4 (ref 35.1–43.9)
WBC: 7.3 10*3/uL (ref 4.0–11.0)

## 2019-07-23 LAB — EKG, 12 LEAD, INITIAL
Atrial Rate: 258 {beats}/min
Calculated R Axis: 76 degrees
Calculated T Axis: 93 degrees
Q-T Interval: 382 ms
QRS Duration: 94 ms
QTC Calculation (Bezet): 464 ms
Ventricular Rate: 89 {beats}/min

## 2019-07-23 LAB — TROPONIN I
Troponin-I: <0.015 ng/ml (ref 0.000–0.045)
Troponin-I: <0.015 ng/ml (ref 0.000–0.045)

## 2019-07-23 LAB — GLUCOSE, POC: Glucose (POC): 137 mg/dL — ABNORMAL HIGH (ref 65–105)

## 2019-07-23 LAB — NT-PRO BNP: NT pro-BNP: 2804 pg/ml — ABNORMAL HIGH (ref 0.0–125.0)

## 2019-07-23 MED ORDER — NALOXONE 0.4 MG/ML INJECTION
0.4 mg/mL | INTRAMUSCULAR | Status: DC | PRN
Start: 2019-07-23 — End: 2019-07-24

## 2019-07-23 MED ORDER — LOSARTAN 50 MG TAB
50 mg | Freq: Every day | ORAL | Status: DC
Start: 2019-07-23 — End: 2019-07-23

## 2019-07-23 MED ORDER — SODIUM CHLORIDE 0.9 % IJ SYRG
INTRAMUSCULAR | Status: DC | PRN
Start: 2019-07-23 — End: 2019-07-24

## 2019-07-23 MED ORDER — SIMVASTATIN 20 MG TAB
20 mg | Freq: Every evening | ORAL | Status: DC
Start: 2019-07-23 — End: 2019-07-23

## 2019-07-23 MED ORDER — RIVAROXABAN 20 MG TAB
20 mg | Freq: Every day | ORAL | Status: DC
Start: 2019-07-23 — End: 2019-07-24
  Administered 2019-07-23: 22:00:00 via ORAL

## 2019-07-23 MED ORDER — FLECAINIDE 50 MG TAB
50 mg | Freq: Two times a day (BID) | ORAL | Status: DC
Start: 2019-07-23 — End: 2019-07-23

## 2019-07-23 MED ORDER — SODIUM CHLORIDE 0.9 % IJ SYRG
Freq: Three times a day (TID) | INTRAMUSCULAR | Status: DC
Start: 2019-07-23 — End: 2019-07-24
  Administered 2019-07-24 (×2): via INTRAVENOUS

## 2019-07-23 MED ORDER — ACETAMINOPHEN 325 MG TABLET
325 mg | Freq: Four times a day (QID) | ORAL | Status: DC | PRN
Start: 2019-07-23 — End: 2019-07-24

## 2019-07-23 MED ORDER — RIVAROXABAN 20 MG TAB
20 mg | Freq: Every day | ORAL | Status: DC
Start: 2019-07-23 — End: 2019-07-23
  Administered 2019-07-23: 22:00:00 via ORAL

## 2019-07-23 MED ORDER — HYDROCODONE-ACETAMINOPHEN 5 MG-325 MG TAB
5-325 mg | ORAL | Status: DC | PRN
Start: 2019-07-23 — End: 2019-07-24

## 2019-07-23 MED ORDER — METOPROLOL TARTRATE 25 MG TAB
25 mg | Freq: Three times a day (TID) | ORAL | Status: DC
Start: 2019-07-23 — End: 2019-07-23

## 2019-07-23 MED ORDER — METOPROLOL TARTRATE 25 MG TAB
25 mg | Freq: Three times a day (TID) | ORAL | Status: DC
Start: 2019-07-23 — End: 2019-07-24
  Administered 2019-07-23 – 2019-07-24 (×3): via ORAL

## 2019-07-23 MED ORDER — LOSARTAN 25 MG TAB
25 mg | Freq: Every day | ORAL | Status: DC
Start: 2019-07-23 — End: 2019-07-24
  Administered 2019-07-24: 13:00:00 via ORAL

## 2019-07-23 MED ORDER — FLECAINIDE 50 MG TAB
50 mg | Freq: Two times a day (BID) | ORAL | Status: DC
Start: 2019-07-23 — End: 2019-07-24
  Administered 2019-07-24 (×2): via ORAL

## 2019-07-23 MED ORDER — SIMVASTATIN 20 MG TAB
20 mg | Freq: Every evening | ORAL | Status: DC
Start: 2019-07-23 — End: 2019-07-24
  Administered 2019-07-24: 02:00:00 via ORAL

## 2019-07-23 MED ORDER — MORPHINE 2 MG/ML INJECTION
2 mg/mL | INTRAMUSCULAR | Status: DC | PRN
Start: 2019-07-23 — End: 2019-07-24

## 2019-07-23 MED FILL — METOPROLOL TARTRATE 25 MG TAB: 25 mg | ORAL | Qty: 1

## 2019-07-23 MED FILL — LOSARTAN 25 MG TAB: 25 mg | ORAL | Qty: 1

## 2019-07-23 MED FILL — XARELTO 20 MG TABLET: 20 mg | ORAL | Qty: 1

## 2019-07-23 NOTE — Progress Notes (Signed)
[facility] Pharmacy Services: Medication History    Medication History Completed Prior to Order Reconciliation?  NO  If "no" and discrepancies were noted please contact attending physician or pharmacist to follow-up: Pharmacist JOSH informed of discrepancies.    Information obtained from (list all that apply, 2 sources preferred): Patient and Other: [employer]    If a history was not reviewed directly with patient/caregiver please comment with the reason why: N/A     Antibiotic use in the last 90 days (3 months): NO      Missing Medication Identified  N/A  Number of medications: -  Indicate action taken: Updated Medication List -      Wrong Medication Identified YES   Number of medications: 1  Indicate action taken: Updated Medication List TYLENOL      Wrong Dose/Interval/Route Identified YES              Number of medications: 2   Indicate action taken: Updated Medication List METFORMIN and LOSARTAN        Is patient currently taking warfarin:  No        Medication Compliance Issues and/or Medication Concerns: N/A      Allergies: Patient has no known allergies.    Prior to Admission Medications:    Prior to Admission Medications   Prescriptions Last Dose Informant Patient Reported? Taking?   XARELTO 20 mg tab tablet 07/23/2019 at 5:39 PM  Yes Yes   Sig: Take 20 mg by mouth daily (with dinner).   clotrimazole-betamethasone (LOTRISONE) topical cream   Yes Yes   Sig: Apply  to affected area as needed. For skin tags    flecainide (TAMBOCOR) 100 mg tablet   Yes Yes   Sig: Take 100 mg by mouth two (2) times a day.   ibuprofen (MOTRIN) 400 mg tablet   Yes Yes   Sig: Take 400 mg by mouth every six (6) hours as needed for Pain.   losartan (COZAAR) 25 mg tablet   Yes Yes   Sig: Take 12.5 mg by mouth daily.   metFORMIN (GLUCOPHAGE) 1,000 mg tablet   Yes Yes   Sig: Take 2,000 mg by mouth two (2) times daily (with meals).   metoprolol tartrate (LOPRESSOR) 25 mg tablet   Yes Yes   Sig: Take 25 mg by mouth three (3) times daily.    simvastatin (ZOCOR) 20 mg tablet   Yes Yes   Sig: Take 20 mg by mouth daily.      Facility-Administered Medications: None         Alexis N. Roxan Hockey, CPHT   Contact: [phone]

## 2019-07-23 NOTE — H&P (Signed)
Hospitalist Admission History and Physical    NAME:  [name]   DOB:   January 20, 1946   MRN:   [id number]     PCP:  Darius Peppers, MD  Admission Date/Time:  07/23/2019 4:58 PM  Anticipated Date of Discharge: 07/26/2019  Anticipated Disposition (home, SNF) : HH         Assessment/Plan:      Active Problems:    * No active hospital problems. *     Chest pain rule out acute coronary syndrome  Atrial fibrillation with rapid ventricular rate  Hypertension  Hyperlipidemia  Prostate cancer status post prostatectomy  Uncontrolled type 2 diabetes mellitus with hyperglycemia  History of tobacco abuse    ___________________________________________________  PLAN:    Admit the patient to telemetry  Continue beta-blocker, flecainide, Xarelto  Cardiology consulted, trend troponin  N.p.o. past midnight for cardioversion in the morning  Continue losartan        Risk of deterioration:  [] Low    [x] Moderate  [] High    Prophylaxis:  [] Lovenox  [] Coumadin  [] Hep SQ  [] SCD???s  [] H2B/PPI[] Eliquis [x] Xarelto     Disposition:  [] Home w/ Family   [x] HH PT,OT,RN   [] SNF/LTC   [] SAH/Rehab           Subjective:   CHIEF COMPLAINT:    Chief Complaint   Patient presents with   ??? Irregular Heart Beat       HISTORY OF PRESENT ILLNESS:     [name] is a 73 y.o. WHITE OR CAUCASIAN male who presents with palpitations since last night  Patient was brought in by paramedics from PCP office, had an episode of heart fluttering last night lasting about an hour  Patient has had A. fib in the past status post multiple ablations, took extra dose of metoprolol  Patient has not seen a cardiologist since August 2020 since he was discharged  He is also noticed some chest tightness along with the palpitations, middle of the chest, nonradiating      Past Medical History:   Diagnosis Date   ??? A-fib ([place])    ??? Benign prostatic hyperplasia with lower urinary tract symptoms    ??? BPH (benign prostatic hypertrophy) with urinary obstruction    ??? Diabetes mellitus ([place])    ???  Elevated prostate specific antigen (PSA)    ??? Enlarged prostate with lower urinary tract symptoms (LUTS)    ??? History of needle biopsy of prostate with negative result    ??? History of urinary retention    ??? Hypercholesteremia    ??? Hypertrophy of prostate with urinary obstruction and other lower urinary tract symptoms (LUTS)    ??? Prostate cancer ([place])      stage T1c Gleason 4+3 (7) adenocarcinoma of the prostate in 7/19 cores involving 30-50%, s/p MRI fusion prostate biopsy 01/20/16   ??? Urinary retention    ??? UTI (lower urinary tract infection)         Past Surgical History:   Procedure Laterality Date   ??? HX CYST REMOVAL     ??? HX PROSTATECTOMY  05/04/2016    DVP Dr Given   ??? HX UROLOGICAL  01/20/2016    PNBx-TRUS Vol 70cc's, Gleason,4+3 occupying core and 50% of the Bx  material, 3+4 present in cores and occupying 30% of the Bx specimen, 4+3 present in cores and occupying 40% of the Bx specimen, 3+4 presen tin core and occupying 35% of the Bx material, Dr Wynona Luna  Social History     Tobacco Use   ??? Smoking status: Former Smoker   ??? Smokeless tobacco: Never Used   Substance Use Topics   ??? Alcohol use: No        Family History   Problem Relation Age of Onset   ??? Diabetes Mother    ??? Hypertension Mother    ??? Diabetes Sister    ??? Hypertension Sister    ??? Stroke Sister         No Known Allergies     Prior to Admission Medications   Prescriptions Last Dose Informant Patient Reported? Taking?   XARELTO 20 mg tab tablet   Yes Yes   Sig: Take 20 mg by mouth daily (with dinner).   acetaminophen (TYLENOL) 325 mg tablet   Yes No   Sig: Take 650 mg by mouth as needed.   clotrimazole-betamethasone (LOTRISONE) topical cream   Yes Yes   Sig: Apply  to affected area. For skin tags   flecainide (TAMBOCOR) 100 mg tablet   Yes Yes   Sig: Take 100 mg by mouth two (2) times a day.   losartan (COZAAR) 25 mg tablet   Yes Yes   Sig: Take 25 mg by mouth daily.   metFORMIN (GLUCOPHAGE) 1,000 mg tablet   Yes Yes   Sig: 1,000 mg two (2)  times daily (with meals).   metoprolol tartrate (LOPRESSOR) 25 mg tablet   Yes Yes   Sig: Take 25 mg by mouth three (3) times daily.   simvastatin (ZOCOR) 20 mg tablet   Yes Yes   Sig: Take 1 Tab by mouth daily.      Facility-Administered Medications: None           REVIEW OF SYSTEMS:     []  Unable to obtain  ROS due to  [] mental status change  [] sedated   [] intubated   [x] Total of 12 systems reviewed as follows:  Constitutional: negative fever, negative chills, negative weight loss  Eyes:   negative visual changes  ENT:   negative sore throat, tongue or lip swelling  Respiratory:  negative cough, negative dyspnea  Cards:  Positive for chest pain/pressure, palpitations  GI:   negative for nausea, vomiting, diarrhea, and abdominal pain  Genitourinary: negative for frequency, dysuria  Integument:  negative for rash and pruritus  Hematologic:  negative for easy bruising and gum/nose bleeding  Musculoskel: negative for myalgias,  back pain and muscle weakness  Neurological:  negative for headaches, dizziness, vertigo  Behavl/Psych: negative for feelings of anxiety, depression       Objective:   VITALS:    Visit Vitals  BP 126/74 (BP 1 Location: Left arm)   Pulse (!) 112   Temp 98.6 ??F (37 ??C)   Resp 18   Ht 5\' 8"  (1.727 m)   Wt 93 kg (205 lb)   SpO2 96%   BMI 31.17 kg/m??     Temp (24hrs), Avg:98.6 ??F (37 ??C), Min:98.6 ??F (37 ??C), Max:98.6 ??F (37 ??C)      PHYSICAL EXAM: (Seen with PPE , gloves , gown , mask n95 and goggles )  General:    Alert, cooperative, no distress, appears stated age.     Head:   Normocephalic, without obvious abnormality, atraumatic.  Eyes:   Conjunctivae clear, anicteric sclerae.  Pupils are equal  Nose:  Nares normal. No drainage or sinus tenderness.  Throat:    Lips, mucosa, and tongue normal.  No Thrush  Neck:  Supple,  symmetrical,  no adenopathy, thyroid: non tender    no carotid bruit and no JVD.  Back:    Symmetric,  No CVA tenderness.  Lungs:   Clear to auscultation bilaterally.  No  Wheezing or Rhonchi. No rales.  Chest wall:  No tenderness or deformity. No Accessory muscle use.  Heart:   Irregular rate and rhythm  Abdomen:   Soft, non-tender. Not distended.  Bowel sounds normal. No masses  Extremities: Extremities normal, atraumatic, No cyanosis.  No edema. No clubbing  Skin:     Texture, turgor normal. No rashes or lesions.  Not Jaundiced  Psych:  Anxious, not agitated  Neurologic: EOMs intact. No facial asymmetry. No aphasia or slurred speech. Normal  strength, Alert and oriented X 3.       LAB DATA REVIEWED:    Recent Results (from the past 12 hour(s))   EKG, 12 LEAD, INITIAL    Collection Time: 07/23/19  2:29 PM   Result Value Ref Range    Ventricular Rate 89 BPM    Atrial Rate 258 BPM    QRS Duration 94 ms    Q-T Interval 382 ms    QTC Calculation (Bezet) 464 ms    Calculated R Axis 76 degrees    Calculated T Axis 93 degrees    Diagnosis       Atrial fibrillation  Incomplete right bundle branch block  Nonspecific ST and T wave abnormality  Prolonged QT  Abnormal ECG  When compared with ECG of 19-May-2019 09:39,  Atrial fibrillation has replaced Sinus rhythm  Confirmed by Truman Hayward, M.D., [place] [phone]) on 07/23/2019 4:18:48 PM     CBC WITH AUTOMATED DIFF    Collection Time: 07/23/19  2:40 PM   Result Value Ref Range    WBC 7.3 4.0 - 11.0 1000/mm3    RBC 4.18 3.80 - 5.70 M/uL    HGB 12.5 12.4 - 17.2 gm/dl    HCT 38.0 37.0 - 50.0 %    MCV 90.9 80.0 - 98.0 fL    MCH 29.9 23.0 - 34.6 pg    MCHC 32.9 30.0 - 36.0 gm/dl    PLATELET 178 140 - 450 1000/mm3    MPV 11.0 (H) 6.0 - 10.0 fL    RDW-SD 41.4 35.1 - 43.9      NRBC 0 0 - 0      IMMATURE GRANULOCYTES 0.4 0.0 - 3.0 %    NEUTROPHILS 53.7 34 - 64 %    LYMPHOCYTES 33.6 28 - 48 %    MONOCYTES 7.6 1 - 13 %    EOSINOPHILS 3.9 0 - 5 %    BASOPHILS 0.8 0 - 3 %   METABOLIC PANEL, BASIC    Collection Time: 07/23/19  2:40 PM   Result Value Ref Range    Sodium 140 136 - 145 mEq/L    Potassium 4.1 3.5 - 5.1 mEq/L    Chloride 106 98 - 107 mEq/L    CO2 30 21 - 32  mEq/L    Glucose 154 (H) 74 - 106 mg/dl    BUN 20 7 - 25 mg/dl    Creatinine 0.9 0.6 - 1.3 mg/dl    GFR est AA >60      GFR est non-AA >60      Calcium 9.1 8.5 - 10.1 mg/dl    Anion gap 5 5 - 15 mmol/L   TROPONIN I    Collection Time: 07/23/19  2:40 PM   Result Value Ref Range  Troponin-I <0.015 0.000 - 0.045 ng/ml   MAGNESIUM    Collection Time: 07/23/19  2:40 PM   Result Value Ref Range    Magnesium 1.7 1.6 - 2.6 mg/dl   NT-PRO BNP    Collection Time: 07/23/19  2:40 PM   Result Value Ref Range    NT pro-BNP 2,804.0 (H) 0.0 - 125.0 pg/ml             Care Plan discussed with:     [x] Patient   [] Family    [] ED Care Manager  [] ED Doc   [] Specialist :    Total care time spent on reviewing the case/data/notes/EMR,examining the patient,documentation,coordinating care with nurses and consultants is 84minutes.       ___________________________________________________  Admitting Physician: Charlaine Dalton, MD     Dragon medical dictation software was used for portions of this report.  Unintended voice transcription errors may have occurred.

## 2019-07-23 NOTE — ED Provider Notes (Signed)
[place]  Emergency Department Treatment Report    Patient: [name] Age: 73 y.o. Sex: male    Date of Birth: May 04, 1946 Admit Date: 07/23/2019 PCP: Maylon Peppers, MD   MRN: [id number]  CSN: [id number]     Room: 2223/2223 Time Dictated: 2:48 PM      *I hereby certify this patient for admission based upon medical necessity as ??  noted below:    Chief Complaint   *Brought in by EMS from MD's office. Had an episode of heart fluttering last night x 1 hr. Took extra dose of metoprolol last night. Denies any chest pain or shortness of breath**  History of Present Illness   72 y.o. male *started last night with episodes of atrial fibrillation.  Had palpitations after eating supper last night around 2100 hrs. lasting for an hour.  Substernal chest pain with this which did not radiate.  No change with twisting turning breathing or moving.  He took an extra metoprolol and slept well.  This morning he felt fine but started having some chest pain again.  This occurred after breakfast.  He had gone to his family physician was noted to be in atrial fibrillation and sent to the emergency room.  He says his pain is 2 out of 10 right now in his chest.  Still nonradiating.  Still not change with twisting turning breathing or moving.  He said this is a new pain for him and not been noted in the past.**    Review of Systems   *Constitutional: No fever, chills, or weight loss  Eyes: No visual symptoms.  ENT: No sore throat, runny nose or ear pain.  Respiratory: *No dyspnea at rest.  No orthopnea.**  Cardiovascular: No chest pain, pressure, palpitations, tightness or heaviness prior to yesterday.  Gastrointestinal: No vomiting, diarrhea or abdominal pain.  Genitourinary: No dysuria, frequency, or urgency.  Musculoskeletal: No joint pain or swelling.  Integumentary: No rashes.  Neurological: No headaches, sensory or motor symptoms.  Denies complaints in all other systems.  **    Past Medical/Surgical History      Past Medical History:   Diagnosis Date   ??? A-fib ([place])    ??? Benign prostatic hyperplasia with lower urinary tract symptoms    ??? BPH (benign prostatic hypertrophy) with urinary obstruction    ??? Diabetes mellitus ([place])    ??? Elevated prostate specific antigen (PSA)    ??? Enlarged prostate with lower urinary tract symptoms (LUTS)    ??? History of needle biopsy of prostate with negative result    ??? History of urinary retention    ??? Hypercholesteremia    ??? Hypertrophy of prostate with urinary obstruction and other lower urinary tract symptoms (LUTS)    ??? 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 Urinary retention    ??? UTI (lower urinary tract infection)      Past Surgical History:   Procedure Laterality Date   ??? HX CYST REMOVAL     ??? HX PROSTATECTOMY  05/04/2016    DVP Dr Given   ??? HX UROLOGICAL  01/20/2016    PNBx-TRUS Vol 70cc's, Gleason,4+3 occupying core and 50% of the Bx  material, 3+4 present in cores and occupying 30% of the Bx specimen, 4+3 present in cores and occupying 40% of the Bx specimen, 3+4 presen tin core  and occupying 35% of the Bx material, Dr Wynona Luna      *   Discharge Summary   Admit Date: 05/15/2019  Discharge Date:  05/19/19  ??  ??  Patient ID:  [name]  73 y.o.  1946/02/15  ??      Chief Complaint   Patient presents with   ??? Irregular Heart Beat   ??? Dizziness   ??  ??  Discharge Diagnoses  ??  1.  Presyncope likely due to paroxysmal A. fib with rapid ventricular response and subsequent orthostasis  -Improved  ??  2.  Paroxysmal atrial fibrillation/flutter  -Status post cardioversion with cardiology on 8/10  -Flecainide increased to 100 mg twice daily, metoprolol increased to 25 mg 3 times daily  -Losartan dose has been decreased to 12.5 mg daily  -Cardiology also recommends outpatient ZIO monitoring and if normal recommend loop monitoring.  ??  3.  Type 2 diabetes  4.   Hyperlipidemia  5.  Hypertension  ??  **  Social History     Social History     Socioeconomic History   ??? Marital status: DIVORCED     Spouse name: Not on file   ??? Number of children: Not on file   ??? Years of education: Not on file   ??? Highest education level: Not on file   Tobacco Use   ??? Smoking status: Former Smoker   ??? Smokeless tobacco: Never Used   Substance and Sexual Activity   ??? Alcohol use: No   ??? Drug use: No     **  Family History     Family History   Problem Relation Age of Onset   ??? Diabetes Mother    ??? Hypertension Mother    ??? Diabetes Sister    ??? Hypertension Sister    ??? Stroke Sister        Current Medications     Prior to Admission Medications   Prescriptions Last Dose Informant Patient Reported? Taking?   XARELTO 20 mg tab tablet   Yes Yes   Sig: Take 20 mg by mouth daily (with dinner).   acetaminophen (TYLENOL) 325 mg tablet   Yes No   Sig: Take 650 mg by mouth as needed.   clotrimazole-betamethasone (LOTRISONE) topical cream   Yes Yes   Sig: Apply  to affected area. For skin tags   flecainide (TAMBOCOR) 100 mg tablet   Yes Yes   Sig: Take 100 mg by mouth two (2) times a day.   losartan (COZAAR) 25 mg tablet   Yes Yes   Sig: Take 25 mg by mouth daily.   metFORMIN (GLUCOPHAGE) 1,000 mg tablet   Yes Yes   Sig: 1,000 mg two (2) times daily (with meals).   metoprolol tartrate (LOPRESSOR) 25 mg tablet   Yes Yes   Sig: Take 25 mg by mouth three (3) times daily.   simvastatin (ZOCOR) 20 mg tablet   Yes Yes   Sig: Take 1 Tab by mouth daily.      Facility-Administered Medications: None       Allergies   No Known Allergies    Physical Exam     ED Triage Vitals [07/23/19 1433]   Enc Vitals Group      BP 126/74      Pulse (Heart Rate) (!) 112      Resp Rate 18      Temp 98.6 ??F (37 ??C)      Temp src  O2 Sat (%) 96 %      Weight 205 lb      Height 5\' 8"       Head Circumference       Peak Flow       Pain Score       Pain Loc       Pain Edu?       Excl. in [place]?      Constitutional: Patient appears well  developed and well nourished. Marland Kitchen Appearance and behavior are age and situation appropriate.  He appears comfortable.   HEENT: Conjunctiva clear.  PERRLA. Mucous membranes moist, non-erythematous. Surface of the pharynx, palate, and tongue are [name], moist and without lesions.  Neck: supple, non tender, symmetrical, no masses or JVD.   Respiratory: *lungs clear no wheezing  Cardiovascular: heart irregular increased rate and rhythm without murmur rubs or gallops.   Calves soft and non-tender. Distal pulses 2+ and equal bilaterally.  No peripheral edema    Gastrointestinal:  Abdomen soft, nontender without complaint of pain to palpation  Musculoskeletal: Nail beds [name] with prompt capillary refill  Integumentary: warm and dry without rashes or lesions  Neurologic: alert and oriented, Sensation intact, motor strength equal and symmetric.  No facial asymmetry or dysarthria.                  Impression and Management Plan   *Intermittent episodes atrial fibrillation, slow down with metoprolol.  Will consult cardiology.  Worrisome chest pain is occurred with these last few episodes to evaluate the patient for coronary disease    Diagnostic Studies   Lab:   Labs Reviewed   CBC WITH AUTOMATED DIFF - Abnormal; Notable for the following components:       Result Value    MPV 11.0 (*)     All other components within normal limits   METABOLIC PANEL, BASIC - Abnormal; Notable for the following components:    Glucose 154 (*)     All other components within normal limits   NT-PRO BNP - Abnormal; Notable for the following components:    NT pro-BNP 2,804.0 (*)     All other components within normal limits   GLUCOSE, POC - Abnormal; Notable for the following components:    Glucose (POC) 137 (*)     All other components within normal limits   TROPONIN I   MAGNESIUM   TROPONIN I       Imaging: X-ray pending to be evaluated by admitting physician  No results found.    EKG: *Atrial fibrillation rate of 89.  I do not see any acute ischemic  changes.**    ED Course   **Patient is to remain comfortably denies shortness of breath.  His monitor shows atrial fibrillation rate between 100 105 so far.  He denied any dyspnea on exertion.  Testing was reviewed.  I talked to cardiology Dr. Marolyn Haller will see him in the morning if he is not converted talked to the patient by another cardioversion.  They were also repeat troponins to see if he is developing myocardial injury.  His elevated proBNP also be worked up further.  Chest x-ray pending at this time the patient has been upstairs and will be followed by admitting physician.*    ER Medications Given:  Medications   flecainide (TAMBOCOR) tablet 100 mg (has no administration in time range)   rivaroxaban (XARELTO) tablet 20 mg (20 mg Oral Given 07/23/19 1739)   losartan (COZAAR) tablet 12.5 mg (has no  administration in time range)   metoprolol tartrate (LOPRESSOR) tablet 25 mg (25 mg Oral Given 07/23/19 1739)   simvastatin (ZOCOR) tablet 20 mg (has no administration in time range)   sodium chloride (NS) flush 5-10 mL (has no administration in time range)   sodium chloride (NS) flush 5-10 mL (has no administration in time range)   naloxone [facility]) injection 0.1 mg (has no administration in time range)   acetaminophen (TYLENOL) tablet 650 mg (has no administration in time range)   HYDROcodone-acetaminophen (NORCO) 5-325 mg per tablet 1 Tab (has no administration in time range)   morphine injection 1 mg (has no administration in time range)       Medical Decision Making   **Paroxysmal atrial fibrillation few times before but now has episodes with chest pain which are concerning for myocardial ischemia.  Last stress test was 5 years ago.  Cardiology go see the patient in follow also place troponins and his elevated proBNP.  Diuresis is not given because the patient is quite comfortable at this time.*  Final Diagnosis       ICD-10-CM ICD-9-CM   1. Atrial fibrillation with rapid ventricular response (HCC)  I48.91  427.31   2. Precordial chest pain  R07.2 786.51       Disposition   *tele**    Lennox Laity, MD  July 23, 2019    My signature above authenticates this document and my orders, the final ??  diagnosis (es), discharge prescription (s), and instructions in the Epic ??  record.  If you have any questions please contact [phone].  ??  Nursing notes have been reviewed by the physician/ advanced practice ??  Clinician.  Dragon medical dictation software was used for portions of this report. Unintended voice recognition errors may occur.

## 2019-07-23 NOTE — Consults (Signed)
Consults  by Tenna Delaine, [place] at 07/23/19 1607                Author: Tenna Delaine, PA  Service: Cardiology  Author Type: Physician Assistant       Filed: 07/23/19 1647  Date of Service: 07/23/19 1607  Status: Attested           Editor: Tenna Delaine, [place] (Physician Assistant)  Cosigner: Maurice Small, MD at 07/23/19 1652          Attestation signed by Maurice Small, MD at 07/23/19 [place]       Patient independently seen and examined. Relevant laboratory, radiology, EKG, and echocardiographic data reviewed; agree with findings and plan in attached PA note. Changes to clinical decision making and plan are detailed below.       Physical exam:    Pleasant, in NAD   Tachycardic, irregularly irregular, no audible m/r/g appreciated   CTAB, no crackles   No LE edema, 2+ distal pulses b/l       In brief, 73 year old male with history of PAF s/p DCCV back in 05/2019, who presented to the ER today with palpitations that started last night, found to be in AF with RVR with HR in the 110-120's. Pt says that he also had some chest pain with the palpitations  - he can still feel the palpitations but no longer having any CP. Pt is on flecainide 100mg  BID and metoprolol TID at home. Is compliant with his Xarelto, has not missed any doses. Initial troponin negative. EKG shows AF, non specific Tw changes.       Remains in AF, HR varying between 90-120's on monitor. Pt appears comfortable and is hemodynamically stable. Cont fleicainide, metoprolol, and Xarelto. Will make NPO at MN for cardioversion in AM with anesthesia. Pt has not missed any doses of Xarelto  in many months, no need for TEE tomorrow. Needs referral for outpatient sleep study - suspect that pt has untreated sleep apnea, which may be contributing to his AF recurrence. AF ablation can be discussed as outpatient, would favor having this conversation  after his sleep study and after he has been sufficiently treated for OSA.  Rest as detailed below.            Maurice Small, MD   [facility]   Pager [phone]   07/23/2019                                 [facility] Consultation Note         [name] 73 y.o.   DOB: 12/05/1945   MRN: [id number]   SSN: [id number]         PCP: Maylon Peppers, MD      DOA: 07/23/2019       Reason for consult: Paroxysmal AF   Requesting physician: ED Physician   Outpatient cardiologist: None at moment (used to see [facility]; last OV 2019)        Impression:     Paroxysmal AF/AFL               H/o DCCV (June 2020) in [place], [place]                H/o [place] (06/2016) in [place], [place]    S/p [place] (05/2019) at [facility]  On Flecainide & Lopressor                NST (Oct 2018): EF 68% with No Ischemia/Infarct    Chronic A/C (Xarelto)   PFO               Echo (Aug 2020): EF 55% with No WMA.  NL R Heart.  No signif. Valve Dz.  RVSP 29.  Mild L > R flow (gradient 10) crossing IAS c/w Small PFO Vs. ASD   HTN   HLD   Prostate CA               S/p Prostatectomy (July 2017)   DM, type 2   BPH   Former Smoker   Clinical suspicion for OSA   ??     Recommendations:     1. Paroxysmal AF/AFL.  H/o DCCV (most recent 05/2019).  Continue Flecainide 100 BID & Lopressor 25 mg TID.  Compliant with Xarelto = NPO after MN with DCCV tomorrow       2. Chronic A/C.  Continue Xarelto 20 daily      3. HLD.  Continue Zocor      4. HTN.  Aim for SBP goal < 130.  Resume BB & Losartan 12.5 mg PO daily       5. Clinical Suspicion for OSA.  Will refer to Pulmonary medicine for Sleep Study       Final Recommendations per Cardiologist       HPI:       [name] is a 73 y.o.  male who is being seen in cardiology consultation for PAF.  Presented to ED today with C/o palpitations since last night.  H/o PAF/AFL with recent admission in Aug 2020, at which time he was successfully  cardioverted back to NSR.  Upon arrival to see patient, he is A&O x 3.  Tele = AF, rate 100-120's.  Patient states he is complaint with  all his medications.  States he has not follow up with a Cardiologist since Aug 2020 hospital admission/Discharge.   States last night he began to c/o palpitations associated with Chest pressure.  Chest pressure located in middle of chest.  Described as pressure.  Non-radiating.  Not associated with other symptoms.  Took extra dose of Lopressor with symptoms improving.   Today, he was at PCP office & referred to ED for Afib.             Patient Active Problem List           Diagnosis  Date Noted         ?  Anxiety  10/26/2017     ?  GERD (gastroesophageal reflux disease)  10/26/2017     ?  Hypercholesterolemia  10/26/2017     ?  Atrial fibrillation with rapid ventricular response ([place])  07/05/2017     ?  Chest pain  07/05/2017     ?  Elevated blood pressure reading without diagnosis of hypertension  01/15/2017     ?  Neuropathy  01/15/2017     ?  Prostate cancer ([place])  09/12/2016     ?  Anemia of chronic disease  05/13/2014     ?  Chronic fatigue  05/13/2014     ?  Controlled type 2 diabetes mellitus without complication ([place])  [id number]     ?  Enlarged prostate with lower urinary tract symptoms (LUTS)  07/01/2012     ?  Urinary retention  07/01/2012         ?  UTI (lower urinary tract infection)  07/01/2012                   Past Medical History:        Diagnosis  Date         ?  A-fib ([place])       ?  Benign prostatic hyperplasia with lower urinary tract symptoms       ?  BPH (benign prostatic hypertrophy) with urinary obstruction       ?  Diabetes mellitus ([place])       ?  Elevated prostate specific antigen (PSA)       ?  Enlarged prostate with lower urinary tract symptoms (LUTS)       ?  History of needle biopsy of prostate with negative result       ?  History of urinary retention       ?  Hypercholesteremia       ?  Hypertrophy of prostate with urinary obstruction and other lower urinary tract symptoms (LUTS)       ?  Prostate cancer [facility])             stage T1c Gleason 4+3 (7) adenocarcinoma of the prostate in  7/19 cores involving 30-50%, s/p MRI fusion prostate biopsy 01/20/16         ?  Urinary retention           ?  UTI (lower urinary tract infection)            Past Surgical History:         Procedure  Laterality  Date          ?  HX CYST REMOVAL         ?  HX PROSTATECTOMY    05/04/2016          DVP Dr Given          ?  HX UROLOGICAL    01/20/2016          PNBx-TRUS Vol 70cc's, Gleason,4+3 occupying core and 50% of the Bx  material, 3+4 present in cores and occupying 30% of the Bx specimen, 4+3 present  in cores and occupying 40% of the Bx specimen, 3+4 presen tin core and occupying 35% of the Bx material, Dr Wynona Luna           Social History          Socioeconomic History         ?  Marital status:  DIVORCED              Spouse name:  Not on file         ?  Number of children:  Not on file     ?  Years of education:  Not on file     ?  Highest education level:  Not on file       Occupational History        ?  Not on file       Social Needs         ?  Financial resource strain:  Not on file        ?  Food insecurity              Worry:  Not on file         Inability:  Not on file        ?  Transportation needs              Medical:  Not on file         Non-medical:  Not on file       Tobacco Use         ?  Smoking status:  Former Smoker     ?  Smokeless tobacco:  Never Used       Substance and Sexual Activity         ?  Alcohol use:  No     ?  Drug use:  No     ?  Sexual activity:  Not on file       Lifestyle        ?  Physical activity              Days per week:  Not on file         Minutes per session:  Not on file         ?  Stress:  Not on file       Relationships        ?  Social Health visitor on phone:  Not on file         Gets together:  Not on file         Attends religious service:  Not on file         Active member of club or organization:  Not on file         Attends meetings of clubs or organizations:  Not on file         Relationship status:  Not on file        ?  Intimate partner violence               Fear of current or ex partner:  Not on file         Emotionally abused:  Not on file         Physically abused:  Not on file         Forced sexual activity:  Not on file        Other Topics  Concern        ?  Not on file       Social History Narrative        ?  Not on file          Family History         Problem  Relation  Age of Onset          ?  Diabetes  Mother       ?  Hypertension  Mother       ?  Diabetes  Sister       ?  Hypertension  Sister            ?  Stroke  Sister             No Known Allergies        Home Medications:          Prior to Admission medications             Medication  Sig  Start Date  End Date  Taking?  Authorizing Provider            flecainide (TAMBOCOR) 100 mg tablet  Take 100 mg by mouth two (2) times a day.  Yes  Other, Phys, MD     losartan (COZAAR) 25 mg tablet  Take 25 mg by mouth daily.      Yes  Other, Phys, MD     metoprolol tartrate (LOPRESSOR) 25 mg tablet  Take 25 mg by mouth three (3) times daily.      Yes  Other, Phys, MD     clotrimazole-betamethasone (LOTRISONE) topical cream  Apply  to affected area. For skin tags      Yes  Other, Phys, MD     simvastatin (ZOCOR) 20 mg tablet  Take 1 Tab by mouth daily.  03/28/19    Yes  Provider, Historical     metFORMIN (GLUCOPHAGE) 1,000 mg tablet  1,000 mg two (2) times daily (with meals).  07/11/16    Yes  Provider, Historical     XARELTO 20 mg tab tablet  Take 20 mg by mouth daily (with dinner).  07/17/14    Yes  Provider, Historical            acetaminophen (TYLENOL) 325 mg tablet  Take 650 mg by mouth as needed.        Provider, Historical             No current facility-administered medications for this encounter.                 Review of Systems:        Positives in bold      Constitutional: Fever, chills, weight loss, weight gain, weakness, fatigue    Eyes: Blurred vision, double vision   ENT: Sore throat, congestion, headache    Respiratory: Cough, shortness of breath, wheezing   Cardiovascular: Chest  pain/pressure , orthopnea, PND, edema, palpitations, syncope   Gastrointestinal: Abdominal pain, nausea, vomiting, diarrhea, constipation, melena, hematochezia, hematemesis   Genitourinary: Painful urination, frequency, urgency   Musculoskeletal: Joint pain, swelling, falls   Integumentary: Rashes, wounds   Endocrine: Heat or cold intolerance, polydipsia, polyuria   Neurological: Headache, dizziness, extremity weakness, paresthesias, presyncope, syncope    Psychiatric: Depression, anxiety, substance abuse            Physical Examination:        Visit Vitals      BP  126/74 (BP 1 Location: Left arm)     Pulse  (!) 112     Temp  98.6 ??F (37 ??C)     Resp  18     Ht  5\' 8"  (1.727 m)     Wt  93 kg (205 lb)     SpO2  96%        BMI  31.17 kg/m??              General: NAD   HEENT: oral mucosa well perfused; conjunctiva not injected, sclera anicteric   Neck: No JVD trachea midline   Resp: B/L Clear, decreased bases. Normal effort no accessory muscle use   Cardiovascular: Irregularly Irregular.   No murmurs or rubs. Palpable DP pulses   Abd: Positive Bowel Sounds, Soft, Nontender nondistended   Ext: No clubbing, cyanosis, Trace B/L LE edema.  Warm and well perfused   Neuro: Alert and oriented x 3, Nonfocal; moves all extremities   Skin: Warm, Dry, Intact      ECG: AF, rate 90's.        Labs:      GFR: Estimated Creatinine Clearance: 82.1 mL/min (by C-G formula based on SCr of 0.9 mg/dL).  Lab Results         Component  Value  Date/Time            WBC  7.3  07/23/2019 02:40 PM       RBC  4.18  07/23/2019 02:40 PM       HGB  12.5  07/23/2019 02:40 PM       HCT  38.0  07/23/2019 02:40 PM       MCV  90.9  07/23/2019 02:40 PM       MCH  29.9  07/23/2019 02:40 PM       MCHC  32.9  07/23/2019 02:40 PM            PLT  178  07/23/2019 02:40 PM             Lab Results         Component  Value  Date/Time            GRANS  53.7  07/23/2019 02:40 PM       LYMPH  33.6  07/23/2019 02:40 PM       MONOS  7.6  07/23/2019 02:40 PM        EOS  3.9  07/23/2019 02:40 PM            BASOS  0.8  07/23/2019 02:40 PM             Lab Results         Component  Value  Date            NA  140  05/19/2019       K  4.2  05/19/2019       CL  111 (H)  05/19/2019       CO2  27  05/19/2019       BUN  17  05/19/2019       CREA  0.9  05/19/2019       GLU  126 (H)  05/19/2019       CA  8.8  05/19/2019       MG  1.8  02/05/2017            PHOS  2.5  02/05/2017              Lab Results         Component  Value  Date/Time            CK  58  07/16/2014 12:36 PM       CK - MB  1.3  07/16/2014 12:36 PM       CK-MB Index  2.2  07/16/2014 12:36 PM            Troponin-I  <0.015  05/16/2019 07:10 PM              Lab Results         Component  Value  Date/Time            TSH  2.520  07/05/2017 09:23 AM            Free T4  0.84  07/05/2017 09:23 AM            No results found for: PTP, INR, APTT, INREXT        Lab Results         Component  Value  Date/Time            Hemoglobin A1c  6.9 (H)  11/20/2017 10:51 AM  Lab Results         Component  Value  Date/Time            Cholesterol, total  176  07/06/2017 02:19 AM       HDL Cholesterol  41  07/06/2017 02:19 AM       LDL, calculated  97  07/06/2017 02:19 AM       Triglyceride  188 (H)  07/06/2017 02:19 AM            CHOL/HDL Ratio  4.3  07/06/2017 02:19 AM            No results found for: ALB, TP, CBIL, TBILI, ALT, AP, AML, LPSE      No results found for: [place], [place], [place], [place], [place], BLDU, NITU, [place], [place], [place], [place], EPSU, [place], [place]   July 23, 2019, 4:07 PM   [phone]

## 2019-07-23 NOTE — Progress Notes (Signed)
Bedside shift change report given to Deneise Lever, RN and Forbes Cellar RN(oncoming nurse) by Harriette Bouillon (off going nurse). Report included the following: Kardex, SBAR, MAR, and cardiac rhythm of A-Fib with RVR

## 2019-07-23 NOTE — Progress Notes (Signed)
Pt refused bed alarm.  Pt education was give by the primary nurse on safety.  Pt still refused bed alarm.  Will continue to monitor.

## 2019-07-23 NOTE — ED Notes (Signed)
Brought in by EMS from MD's office. Had an episode of heart fluttering last night x 1 hr. Took extra dose of metoprolol last night. Denies any chest pain or shortness of breath

## 2019-07-23 NOTE — Progress Notes (Addendum)
Bedside shift change report given to Deneise Lever, RN and Forbes Cellar RN(oncoming nurse) by Harriette Bouillon (off going nurse). Report included the following: Kardex, SBAR, MAR, and cardiac rhythm of A-Fib with RVR

## 2019-07-23 NOTE — ED Provider Notes (Signed)
[facility]  Emergency Department Treatment Report    Patient: [name] Age: 73 y.o. Sex: male    Date of Birth: [birth year] Admit Date: 07/23/2019 PCP: Maylon Peppers, MD   MRN: [id number]  CSN: [id number]     Room: 2223/2223 Time Dictated: 2:48 PM      *I hereby certify this patient for admission based upon medical necessity as ??  noted below:    Chief Complaint   *Brought in by EMS from MD's office. Had an episode of heart fluttering last night x 1 hr. Took extra dose of metoprolol last night. Denies any chest pain or shortness of breath**  History of Present Illness   73 y.o. male *started last night with episodes of atrial fibrillation.  Had palpitations after eating supper last night around 2100 hrs. lasting for an hour.  Substernal chest pain with this which did not radiate.  No change with twisting turning breathing or moving.  He took an extra metoprolol and slept well.  This morning he felt fine but started having some chest pain again.  This occurred after breakfast.  He had gone to his family physician was noted to be in atrial fibrillation and sent to the emergency room.  He says his pain is 2 out of 10 right now in his chest.  Still nonradiating.  Still not change with twisting turning breathing or moving.  He said this is a new pain for him and not been noted in the past.**    Review of Systems   *Constitutional: No fever, chills, or weight loss  Eyes: No visual symptoms.  ENT: No sore throat, runny nose or ear pain.  Respiratory: *No dyspnea at rest.  No orthopnea.**  Cardiovascular: No chest pain, pressure, palpitations, tightness or heaviness prior to yesterday.  Gastrointestinal: No vomiting, diarrhea or abdominal pain.  Genitourinary: No dysuria, frequency, or urgency.  Musculoskeletal: No joint pain or swelling.  Integumentary: No rashes.  Neurological: No headaches, sensory or motor symptoms.  Denies complaints in all other systems.  **    Past Medical/Surgical History      Past Medical History:   Diagnosis Date   ??? A-fib ([place])    ??? Benign prostatic hyperplasia with lower urinary tract symptoms    ??? BPH (benign prostatic hypertrophy) with urinary obstruction    ??? Diabetes mellitus ([place])    ??? Elevated prostate specific antigen (PSA)    ??? Enlarged prostate with lower urinary tract symptoms (LUTS)    ??? History of needle biopsy of prostate with negative result    ??? History of urinary retention    ??? Hypercholesteremia    ??? Hypertrophy of prostate with urinary obstruction and other lower urinary tract symptoms (LUTS)    ??? 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 Urinary retention    ??? UTI (lower urinary tract infection)      Past Surgical History:   Procedure Laterality Date   ??? HX CYST REMOVAL     ??? HX PROSTATECTOMY  05/04/2016    [facility] Dr Given   ??? HX UROLOGICAL  01/20/2016    PNBx-TRUS Vol 70cc's, Gleason,4+3 occupying core and 50% of the Bx  material, 3+4 present in cores and occupying 30% of the Bx specimen, 4+3 present in cores and occupying 40% of the Bx specimen, 3+4 presen tin core  and occupying 35% of the Bx material, Dr Wynona Luna      *   Discharge Summary   Admit Date: 05/15/2019  Discharge Date:  05/19/19  ??  ??  Patient ID:  [name]  73 y.o.  May 29, 1946  ??      Chief Complaint   Patient presents with   ??? Irregular Heart Beat   ??? Dizziness   ??  ??  Discharge Diagnoses  ??  1.  Presyncope likely due to paroxysmal A. fib with rapid ventricular response and subsequent orthostasis  -Improved  ??  2.  Paroxysmal atrial fibrillation/flutter  -Status post cardioversion with cardiology on 8/10  -Flecainide increased to 100 mg twice daily, metoprolol increased to 25 mg 3 times daily  -Losartan dose has been decreased to 12.5 mg daily  -Cardiology also recommends outpatient ZIO monitoring and if normal recommend loop monitoring.  ??  3.  Type 2 diabetes  4.  Hyperlipidemia   5.  Hypertension  ??  **  Social History     Social History     Socioeconomic History   ??? Marital status: DIVORCED     Spouse name: Not on file   ??? Number of children: Not on file   ??? Years of education: Not on file   ??? Highest education level: Not on file   Tobacco Use   ??? Smoking status: Former Smoker   ??? Smokeless tobacco: Never Used   Substance and Sexual Activity   ??? Alcohol use: No   ??? Drug use: No     **  Family History     Family History   Problem Relation Age of Onset   ??? Diabetes Mother    ??? Hypertension Mother    ??? Diabetes Sister    ??? Hypertension Sister    ??? Stroke Sister        Current Medications     Prior to Admission Medications   Prescriptions Last Dose Informant Patient Reported? Taking?   XARELTO 20 mg tab tablet   Yes Yes   Sig: Take 20 mg by mouth daily (with dinner).   acetaminophen (TYLENOL) 325 mg tablet   Yes No   Sig: Take 650 mg by mouth as needed.   clotrimazole-betamethasone (LOTRISONE) topical cream   Yes Yes   Sig: Apply  to affected area. For skin tags   flecainide (TAMBOCOR) 100 mg tablet   Yes Yes   Sig: Take 100 mg by mouth two (2) times a day.   losartan (COZAAR) 25 mg tablet   Yes Yes   Sig: Take 25 mg by mouth daily.   metFORMIN (GLUCOPHAGE) 1,000 mg tablet   Yes Yes   Sig: 1,000 mg two (2) times daily (with meals).   metoprolol tartrate (LOPRESSOR) 25 mg tablet   Yes Yes   Sig: Take 25 mg by mouth three (3) times daily.   simvastatin (ZOCOR) 20 mg tablet   Yes Yes   Sig: Take 1 Tab by mouth daily.      Facility-Administered Medications: None       Allergies   No Known Allergies    Physical Exam     ED Triage Vitals [07/23/19 1433]   Enc Vitals Group      BP 126/74      Pulse (Heart Rate) (!) 112      Resp Rate 18      Temp 98.6 ??F (37 ??C)      Temp src  O2 Sat (%) 96 %      Weight 205 lb      Height 5\' 8"       Head Circumference       Peak Flow       Pain Score       Pain Loc       Pain Edu?       Excl. in [place]?       Constitutional: Patient appears well developed and well nourished. Marland Kitchen Appearance and behavior are age and situation appropriate.  He appears comfortable.   HEENT: Conjunctiva clear.  PERRLA. Mucous membranes moist, non-erythematous. Surface of the pharynx, palate, and tongue are pink, moist and without lesions.  Neck: supple, non tender, symmetrical, no masses or JVD.   Respiratory: *lungs clear no wheezing  Cardiovascular: heart irregular increased rate and rhythm without murmur rubs or gallops.   Calves soft and non-tender. Distal pulses 2+ and equal bilaterally.  No peripheral edema    Gastrointestinal:  Abdomen soft, nontender without complaint of pain to palpation  Musculoskeletal: Nail beds pink with prompt capillary refill  Integumentary: warm and dry without rashes or lesions  Neurologic: alert and oriented, Sensation intact, motor strength equal and symmetric.  No facial asymmetry or dysarthria.                  Impression and Management Plan   *Intermittent episodes atrial fibrillation, slow down with metoprolol.  Will consult cardiology.  Worrisome chest pain is occurred with these last few episodes to evaluate the patient for coronary disease    Diagnostic Studies   Lab:   Labs Reviewed   CBC WITH AUTOMATED DIFF - Abnormal; Notable for the following components:       Result Value    MPV 11.0 (*)     All other components within normal limits   METABOLIC PANEL, BASIC - Abnormal; Notable for the following components:    Glucose 154 (*)     All other components within normal limits   NT-PRO BNP - Abnormal; Notable for the following components:    NT pro-BNP 2,804.0 (*)     All other components within normal limits   GLUCOSE, POC - Abnormal; Notable for the following components:    Glucose (POC) 137 (*)     All other components within normal limits   TROPONIN I   MAGNESIUM   TROPONIN I       Imaging: X-ray pending to be evaluated by admitting physician  No results found.     EKG: *Atrial fibrillation rate of 89.  I do not see any acute ischemic changes.**    ED Course   **Patient is to remain comfortably denies shortness of breath.  His monitor shows atrial fibrillation rate between 100 105 so far.  He denied any dyspnea on exertion.  Testing was reviewed.  I talked to cardiology Dr. Marolyn Haller will see him in the morning if he is not converted talked to the patient by another cardioversion.  They were also repeat troponins to see if he is developing myocardial injury.  His elevated proBNP also be worked up further.  Chest x-ray pending at this time the patient has been upstairs and will be followed by admitting physician.*    ER Medications Given:  Medications   flecainide (TAMBOCOR) tablet 100 mg (has no administration in time range)   rivaroxaban (XARELTO) tablet 20 mg (20 mg Oral Given 07/23/19 1739)   losartan (COZAAR) tablet 12.5 mg (has no  administration in time range)   metoprolol tartrate (LOPRESSOR) tablet 25 mg (25 mg Oral Given 07/23/19 1739)   simvastatin (ZOCOR) tablet 20 mg (has no administration in time range)   sodium chloride (NS) flush 5-10 mL (has no administration in time range)   sodium chloride (NS) flush 5-10 mL (has no administration in time range)   naloxone [facility]) injection 0.1 mg (has no administration in time range)   acetaminophen (TYLENOL) tablet 650 mg (has no administration in time range)   HYDROcodone-acetaminophen (NORCO) 5-325 mg per tablet 1 Tab (has no administration in time range)   morphine injection 1 mg (has no administration in time range)       Medical Decision Making   **Paroxysmal atrial fibrillation few times before but now has episodes with chest pain which are concerning for myocardial ischemia.  Last stress test was 5 years ago.  Cardiology go see the patient in follow also place troponins and his elevated proBNP.  Diuresis is not given because the patient is quite comfortable at this time.*  Final Diagnosis       ICD-10-CM ICD-9-CM    1. Atrial fibrillation with rapid ventricular response (HCC)  I48.91 427.31   2. Precordial chest pain  R07.2 786.51       Disposition   *tele**    Lennox Laity, MD  July 23, 2019    My signature above authenticates this document and my orders, the final ??  diagnosis (es), discharge prescription (s), and instructions in the Epic ??  record.  If you have any questions please contact [phone].  ??  Nursing notes have been reviewed by the physician/ advanced practice ??  Clinician.  Dragon medical dictation software was used for portions of this report. Unintended voice recognition errors may occur.

## 2019-07-23 NOTE — Other (Signed)
TRANSFER - OUT REPORT:    Verbal report given to Lattie Haw, RN(name) on [name]  being transferred to 2223(unit) for routine progression of care       Report consisted of patient???s Situation, Background, Assessment and   Recommendations(SBAR).     Information from the following report(s) ED Summary and Procedure Summary was reviewed with the receiving nurse.    Lines:   Peripheral IV 07/23/19 Right Antecubital (Active)       Peripheral IV 07/23/19 Left Antecubital (Active)   Site Assessment Clean, dry, & intact 07/23/19 1440   Phlebitis Assessment 0 07/23/19 1440   Infiltration Assessment 0 07/23/19 1440   Dressing Status Clean, dry, & intact 07/23/19 1440   Dressing Type Transparent 07/23/19 1440   Hub Color/Line Status Pink;Flushed;Patent 07/23/19 1440   Action Taken Blood drawn 07/23/19 1440   Alcohol Cap Used Yes 07/23/19 1440        Opportunity for questions and clarification was provided.      Patient transported with:   Registered Nurse

## 2019-07-23 NOTE — Progress Notes (Signed)
[facility]: Medication History    Medication History Completed Prior to Order Reconciliation?  NO  If "no" and discrepancies were noted please contact attending physician or pharmacist to follow-up: Pharmacist JOSH informed of discrepancies.    Information obtained from (list all that apply, 2 sources preferred): Patient and Other: [employer]    If a history was not reviewed directly with patient/caregiver please comment with the reason why: N/A     Antibiotic use in the last 90 days (3 months): NO      Missing Medication Identified  N/A  Number of medications: -  Indicate action taken: Updated Medication List -      Wrong Medication Identified YES   Number of medications: 1  Indicate action taken: Updated Medication List TYLENOL      Wrong Dose/Interval/Route Identified YES              Number of medications: 2   Indicate action taken: Updated Medication List METFORMIN and LOSARTAN        Is patient currently taking warfarin:  No        Medication Compliance Issues and/or Medication Concerns: N/A      Allergies: Patient has no known allergies.    Prior to Admission Medications:    Prior to Admission Medications   Prescriptions Last Dose Informant Patient Reported? Taking?   XARELTO 20 mg tab tablet 07/23/2019 at 5:39 PM  Yes Yes   Sig: Take 20 mg by mouth daily (with dinner).   clotrimazole-betamethasone (LOTRISONE) topical cream   Yes Yes   Sig: Apply  to affected area as needed. For skin tags    flecainide (TAMBOCOR) 100 mg tablet   Yes Yes   Sig: Take 100 mg by mouth two (2) times a day.   ibuprofen (MOTRIN) 400 mg tablet   Yes Yes   Sig: Take 400 mg by mouth every six (6) hours as needed for Pain.   losartan (COZAAR) 25 mg tablet   Yes Yes   Sig: Take 12.5 mg by mouth daily.   metFORMIN (GLUCOPHAGE) 1,000 mg tablet   Yes Yes   Sig: Take 2,000 mg by mouth two (2) times daily (with meals).   metoprolol tartrate (LOPRESSOR) 25 mg tablet   Yes Yes   Sig: Take 25 mg by mouth three (3) times daily.    simvastatin (ZOCOR) 20 mg tablet   Yes Yes   Sig: Take 20 mg by mouth daily.      Facility-Administered Medications: None         Alexis N. Quentin Cornwall, CPHT   Contact: [phone]

## 2019-07-23 NOTE — Consults (Signed)
[facility] Consultation Note      [name] 73 y.o.  DOB: 1946-05-28  MRN: [id number]  SSN: [id number]      PCP: Maylon Peppers, MD    DOA: 07/23/2019     Reason for consult: Paroxysmal AF  Requesting physician: ED Physician  Outpatient cardiologist: None at moment (used to see CVAL; last OV 2019)    Impression:   Paroxysmal AF/AFL              H/o DCCV (June 2020) in [place], [place]               H/o [place] (06/2016) in [place], [place]   S/p [place] (05/2019) at [facility]               On Flecainide & Lopressor               NST (Oct 2018): EF 68% with No Ischemia/Infarct   Chronic A/C (Xarelto)  PFO              Echo (Aug 2020): EF 55% with No WMA.  NL R Heart.  No signif. Valve Dz.  RVSP 29.  Mild L > R flow (gradient 10) crossing IAS c/w Small PFO Vs. ASD  HTN  HLD  Prostate CA              S/p Prostatectomy (July 2017)  DM, type 2  BPH  Former Smoker  Clinical suspicion for OSA  ??  Recommendations:   1. Paroxysmal AF/AFL.  H/o DCCV (most recent 05/2019).  Continue Flecainide 100 BID & Lopressor 25 mg TID.  Compliant with Xarelto = NPO after MN with DCCV tomorrow     2. Chronic A/C.  Continue Xarelto 20 daily    3. HLD.  Continue Zocor    4. HTN.  Aim for SBP goal < 130.  Resume BB & Losartan 12.5 mg PO daily     5. Clinical Suspicion for OSA.  Will refer to Pulmonary medicine for Sleep Study     Final Recommendations per Cardiologist     HPI:     [name] is a 73 y.o. male who is being seen in cardiology consultation for PAF.  Presented to ED today with C/o palpitations since last night.  H/o PAF/AFL with recent admission in Aug 2020, at which time he was successfully cardioverted back to NSR.  Upon arrival to see patient, he is A&O x 3.  Tele = AF, rate 100-120's.  Patient states he is complaint with all his medications.  States he has not follow up with a Cardiologist since Aug 2020 hospital admission/Discharge.  States last night he began to c/o palpitations associated with Chest pressure.  Chest  pressure located in middle of chest.  Described as pressure.  Non-radiating.  Not associated with other symptoms.  Took extra dose of Lopressor with symptoms improving.  Today, he was at PCP office & referred to ED for Afib.        Patient Active Problem List    Diagnosis Date Noted   ??? Anxiety 10/26/2017   ??? GERD (gastroesophageal reflux disease) 10/26/2017   ??? Hypercholesterolemia 10/26/2017   ??? Atrial fibrillation with rapid ventricular response ([place]) 07/05/2017   ??? Chest pain 07/05/2017   ??? Elevated blood pressure reading without diagnosis of hypertension 01/15/2017   ??? Neuropathy 01/15/2017   ??? Prostate cancer ([place]) 09/12/2016   ??? Anemia of chronic disease 05/13/2014   ??? Chronic fatigue 05/13/2014   ???  Controlled type 2 diabetes mellitus without complication ([place]) [id number]   ??? Enlarged prostate with lower urinary tract symptoms (LUTS) 07/01/2012   ??? Urinary retention 07/01/2012   ??? UTI (lower urinary tract infection) 07/01/2012           Past Medical History:   Diagnosis Date   ??? A-fib ([place])    ??? Benign prostatic hyperplasia with lower urinary tract symptoms    ??? BPH (benign prostatic hypertrophy) with urinary obstruction    ??? Diabetes mellitus ([place])    ??? Elevated prostate specific antigen (PSA)    ??? Enlarged prostate with lower urinary tract symptoms (LUTS)    ??? History of needle biopsy of prostate with negative result    ??? History of urinary retention    ??? Hypercholesteremia    ??? Hypertrophy of prostate with urinary obstruction and other lower urinary tract symptoms (LUTS)    ??? Prostate cancer ([place])      stage T1c Gleason 4+3 (7) adenocarcinoma of the prostate in 7/19 cores involving 30-50%, s/p MRI fusion prostate biopsy 01/20/16   ??? Urinary retention    ??? UTI (lower urinary tract infection)      Past Surgical History:   Procedure Laterality Date   ??? HX CYST REMOVAL     ??? HX PROSTATECTOMY  05/04/2016    DVP Dr Given   ??? HX UROLOGICAL  01/20/2016     PNBx-TRUS Vol 70cc's, Gleason,4+3 occupying core and 50% of the Bx  material, 3+4 present in cores and occupying 30% of the Bx specimen, 4+3 present in cores and occupying 40% of the Bx specimen, 3+4 presen tin core and occupying 35% of the Bx material, Dr Wynona Luna      Social History     Socioeconomic History   ??? Marital status: DIVORCED     Spouse name: Not on file   ??? Number of children: Not on file   ??? Years of education: Not on file   ??? Highest education level: Not on file   Occupational History   ??? Not on file   Social Needs   ??? Financial resource strain: Not on file   ??? Food insecurity     Worry: Not on file     Inability: Not on file   ??? Transportation needs     Medical: Not on file     Non-medical: Not on file   Tobacco Use   ??? Smoking status: Former Smoker   ??? Smokeless tobacco: Never Used   Substance and Sexual Activity   ??? Alcohol use: No   ??? Drug use: No   ??? Sexual activity: Not on file   Lifestyle   ??? Physical activity     Days per week: Not on file     Minutes per session: Not on file   ??? Stress: Not on file   Relationships   ??? Social Product manager on phone: Not on file     Gets together: Not on file     Attends religious service: Not on file     Active member of club or organization: Not on file     Attends meetings of clubs or organizations: Not on file     Relationship status: Not on file   ??? Intimate partner violence     Fear of current or ex partner: Not on file     Emotionally abused: Not on file     Physically abused: Not on file  Forced sexual activity: Not on file   Other Topics Concern   ??? Not on file   Social History Narrative   ??? Not on file     Family History   Problem Relation Age of Onset   ??? Diabetes Mother    ??? Hypertension Mother    ??? Diabetes Sister    ??? Hypertension Sister    ??? Stroke Sister        No Known Allergies    Home Medications:     Prior to Admission medications    Medication Sig Start Date End Date Taking? Authorizing Provider    flecainide (TAMBOCOR) 100 mg tablet Take 100 mg by mouth two (2) times a day.   Yes Other, Phys, MD   losartan (COZAAR) 25 mg tablet Take 25 mg by mouth daily.   Yes Other, Phys, MD   metoprolol tartrate (LOPRESSOR) 25 mg tablet Take 25 mg by mouth three (3) times daily.   Yes Other, Phys, MD   clotrimazole-betamethasone (LOTRISONE) topical cream Apply  to affected area. For skin tags   Yes Other, Phys, MD   simvastatin (ZOCOR) 20 mg tablet Take 1 Tab by mouth daily. 03/28/19  Yes Provider, Historical   metFORMIN (GLUCOPHAGE) 1,000 mg tablet 1,000 mg two (2) times daily (with meals). 07/11/16  Yes Provider, Historical   XARELTO 20 mg tab tablet Take 20 mg by mouth daily (with dinner). 07/17/14  Yes Provider, Historical   acetaminophen (TYLENOL) 325 mg tablet Take 650 mg by mouth as needed.    Provider, Historical       No current facility-administered medications for this encounter.          Review of Systems:     Positives in bold    Constitutional: Fever, chills, weight loss, weight gain, weakness, fatigue   Eyes: Blurred vision, double vision  ENT: Sore throat, congestion, headache   Respiratory: Cough, shortness of breath, wheezing  Cardiovascular: Chest pain/pressure, orthopnea, PND, edema, palpitations, syncope  Gastrointestinal: Abdominal pain, nausea, vomiting, diarrhea, constipation, melena, hematochezia, hematemesis  Genitourinary: Painful urination, frequency, urgency  Musculoskeletal: Joint pain, swelling, falls  Integumentary: Rashes, wounds  Endocrine: Heat or cold intolerance, polydipsia, polyuria  Neurological: Headache, dizziness, extremity weakness, paresthesias, presyncope, syncope   Psychiatric: Depression, anxiety, substance abuse       Physical Examination:     Visit Vitals  BP 126/74 (BP 1 Location: Left arm)   Pulse (!) 112   Temp 98.6 ??F (37 ??C)   Resp 18   Ht 5\' 8"  (1.727 m)   Wt 93 kg (205 lb)   SpO2 96%   BMI 31.17 kg/m??         General: NAD   HEENT: oral mucosa well perfused; conjunctiva not injected, sclera anicteric  Neck: No JVD trachea midline  Resp: B/L Clear, decreased bases. Normal effort no accessory muscle use  Cardiovascular: Irregularly Irregular.   No murmurs or rubs. Palpable DP pulses  Abd: Positive Bowel Sounds, Soft, Nontender nondistended  Ext: No clubbing, cyanosis, Trace B/L LE edema.  Warm and well perfused  Neuro: Alert and oriented x 3, Nonfocal; moves all extremities  Skin: Warm, Dry, Intact    ECG: AF, rate 90's.      Labs:    GFR: Estimated Creatinine Clearance: 82.1 mL/min (by C-G formula based on SCr of 0.9 mg/dL).     Lab Results   Component Value Date/Time    WBC 7.3 07/23/2019 02:40 PM    RBC 4.18  07/23/2019 02:40 PM    HGB 12.5 07/23/2019 02:40 PM    HCT 38.0 07/23/2019 02:40 PM    MCV 90.9 07/23/2019 02:40 PM    MCH 29.9 07/23/2019 02:40 PM    MCHC 32.9 07/23/2019 02:40 PM    PLT 178 07/23/2019 02:40 PM       Lab Results   Component Value Date/Time    GRANS 53.7 07/23/2019 02:40 PM    LYMPH 33.6 07/23/2019 02:40 PM    MONOS 7.6 07/23/2019 02:40 PM    EOS 3.9 07/23/2019 02:40 PM    BASOS 0.8 07/23/2019 02:40 PM       Lab Results   Component Value Date    NA 140 05/19/2019    K 4.2 05/19/2019    CL 111 (H) 05/19/2019    CO2 27 05/19/2019    BUN 17 05/19/2019    CREA 0.9 05/19/2019    GLU 126 (H) 05/19/2019    CA 8.8 05/19/2019    MG 1.8 02/05/2017    PHOS 2.5 02/05/2017        Lab Results   Component Value Date/Time    CK 58 07/16/2014 12:36 PM    CK - MB 1.3 07/16/2014 12:36 PM    CK-MB Index 2.2 07/16/2014 12:36 PM    Troponin-I <0.015 05/16/2019 07:10 PM        Lab Results   Component Value Date/Time    TSH 2.520 07/05/2017 09:23 AM    Free T4 0.84 07/05/2017 09:23 AM        No results found for: PTP, INR, APTT, INREXT    Lab Results   Component Value Date/Time    Hemoglobin A1c 6.9 (H) 11/20/2017 10:51 AM         Lab Results   Component Value Date/Time    Cholesterol, total 176 07/06/2017 02:19 AM     HDL Cholesterol 41 07/06/2017 02:19 AM    LDL, calculated 97 07/06/2017 02:19 AM    Triglyceride 188 (H) 07/06/2017 02:19 AM    CHOL/HDL Ratio 4.3 07/06/2017 02:19 AM        No results found for: ALB, TP, CBIL, TBILI, ALT, AP, AML, LPSE    No results found for: [place], [place], [place], [place], [place], BLDU, NITU, [place], [place], [place], [place], [place], [place], [place]  July 23, 2019, 4:07 PM  [phone]

## 2019-07-23 NOTE — H&P (Signed)
Hospitalist Admission History and Physical    NAME:  [name]   DOB:   18-Jun-1946   MRN:   [id number]     PCP:  Darius Peppers, MD  Admission Date/Time:  07/23/2019 4:58 PM  Anticipated Date of Discharge: 07/26/2019  Anticipated Disposition (home, SNF) : HH         Assessment/Plan:      Active Problems:    * No active hospital problems. *     Chest pain rule out acute coronary syndrome  Atrial fibrillation with rapid ventricular rate  Hypertension  Hyperlipidemia  Prostate cancer status post prostatectomy  Uncontrolled type 2 diabetes mellitus with hyperglycemia  History of tobacco abuse    ___________________________________________________  PLAN:    Admit the patient to telemetry  Continue beta-blocker, flecainide, Xarelto  Cardiology consulted, trend troponin  N.p.o. past midnight for cardioversion in the morning  Continue losartan        Risk of deterioration:  [] Low    [x] Moderate  [] High    Prophylaxis:  [] Lovenox  [] Coumadin  [] Hep SQ  [] SCD???s  [] H2B/PPI[] Eliquis [x] Xarelto     Disposition:  [] Home w/ Family   [x] HH PT,OT,RN   [] SNF/LTC   [] SAH/Rehab           Subjective:   CHIEF COMPLAINT:    Chief Complaint   Patient presents with   ??? Irregular Heart Beat       HISTORY OF PRESENT ILLNESS:     [name] is a 73 y.o. WHITE OR CAUCASIAN male who presents with palpitations since last night  Patient was brought in by paramedics from PCP office, had an episode of heart fluttering last night lasting about an hour  Patient has had A. fib in the past status post multiple ablations, took extra dose of metoprolol  Patient has not seen a cardiologist since August 2020 since he was discharged  He is also noticed some chest tightness along with the palpitations, middle of the chest, nonradiating      Past Medical History:   Diagnosis Date   ??? A-fib ([place])    ??? Benign prostatic hyperplasia with lower urinary tract symptoms    ??? BPH (benign prostatic hypertrophy) with urinary obstruction    ??? Diabetes mellitus ([place])     ??? Elevated prostate specific antigen (PSA)    ??? Enlarged prostate with lower urinary tract symptoms (LUTS)    ??? History of needle biopsy of prostate with negative result    ??? History of urinary retention    ??? Hypercholesteremia    ??? Hypertrophy of prostate with urinary obstruction and other lower urinary tract symptoms (LUTS)    ??? Prostate cancer ([place])      stage T1c Gleason 4+3 (7) adenocarcinoma of the prostate in 7/19 cores involving 30-50%, s/p MRI fusion prostate biopsy 01/20/16   ??? Urinary retention    ??? UTI (lower urinary tract infection)         Past Surgical History:   Procedure Laterality Date   ??? HX CYST REMOVAL     ??? HX PROSTATECTOMY  05/04/2016    DVP Dr Given   ??? HX UROLOGICAL  01/20/2016    PNBx-TRUS Vol 70cc's, Gleason,4+3 occupying core and 50% of the Bx  material, 3+4 present in cores and occupying 30% of the Bx specimen, 4+3 present in cores and occupying 40% of the Bx specimen, 3+4 presen tin core and occupying 35% of the Bx material, Dr Wynona Luna  Social History     Tobacco Use   ??? Smoking status: Former Smoker   ??? Smokeless tobacco: Never Used   Substance Use Topics   ??? Alcohol use: No        Family History   Problem Relation Age of Onset   ??? Diabetes Mother    ??? Hypertension Mother    ??? Diabetes Sister    ??? Hypertension Sister    ??? Stroke Sister         No Known Allergies     Prior to Admission Medications   Prescriptions Last Dose Informant Patient Reported? Taking?   XARELTO 20 mg tab tablet   Yes Yes   Sig: Take 20 mg by mouth daily (with dinner).   acetaminophen (TYLENOL) 325 mg tablet   Yes No   Sig: Take 650 mg by mouth as needed.   clotrimazole-betamethasone (LOTRISONE) topical cream   Yes Yes   Sig: Apply  to affected area. For skin tags   flecainide (TAMBOCOR) 100 mg tablet   Yes Yes   Sig: Take 100 mg by mouth two (2) times a day.   losartan (COZAAR) 25 mg tablet   Yes Yes   Sig: Take 25 mg by mouth daily.   metFORMIN (GLUCOPHAGE) 1,000 mg tablet   Yes Yes    Sig: 1,000 mg two (2) times daily (with meals).   metoprolol tartrate (LOPRESSOR) 25 mg tablet   Yes Yes   Sig: Take 25 mg by mouth three (3) times daily.   simvastatin (ZOCOR) 20 mg tablet   Yes Yes   Sig: Take 1 Tab by mouth daily.      Facility-Administered Medications: None           REVIEW OF SYSTEMS:     []  Unable to obtain  ROS due to  [] mental status change  [] sedated   [] intubated   [x] Total of 12 systems reviewed as follows:  Constitutional: negative fever, negative chills, negative weight loss  Eyes:   negative visual changes  ENT:   negative sore throat, tongue or lip swelling  Respiratory:  negative cough, negative dyspnea  Cards:  Positive for chest pain/pressure, palpitations  GI:   negative for nausea, vomiting, diarrhea, and abdominal pain  Genitourinary: negative for frequency, dysuria  Integument:  negative for rash and pruritus  Hematologic:  negative for easy bruising and gum/nose bleeding  Musculoskel: negative for myalgias,  back pain and muscle weakness  Neurological:  negative for headaches, dizziness, vertigo  Behavl/Psych: negative for feelings of anxiety, depression       Objective:   VITALS:    Visit Vitals  BP 126/74 (BP 1 Location: Left arm)   Pulse (!) 112   Temp 98.6 ??F (37 ??C)   Resp 18   Ht 5\' 8"  (1.727 m)   Wt 93 kg (205 lb)   SpO2 96%   BMI 31.17 kg/m??     Temp (24hrs), Avg:98.6 ??F (37 ??C), Min:98.6 ??F (37 ??C), Max:98.6 ??F (37 ??C)      PHYSICAL EXAM: (Seen with PPE , gloves , gown , mask n95 and goggles )  General:    Alert, cooperative, no distress, appears stated age.     Head:   Normocephalic, without obvious abnormality, atraumatic.  Eyes:   Conjunctivae clear, anicteric sclerae.  Pupils are equal  Nose:  Nares normal. No drainage or sinus tenderness.  Throat:    Lips, mucosa, and tongue normal.  No Thrush  Neck:  Supple,  symmetrical,  no adenopathy, thyroid: non tender    no carotid bruit and no JVD.  Back:    Symmetric,  No CVA tenderness.   Lungs:   Clear to auscultation bilaterally.  No Wheezing or Rhonchi. No rales.  Chest wall:  No tenderness or deformity. No Accessory muscle use.  Heart:   Irregular rate and rhythm  Abdomen:   Soft, non-tender. Not distended.  Bowel sounds normal. No masses  Extremities: Extremities normal, atraumatic, No cyanosis.  No edema. No clubbing  Skin:     Texture, turgor normal. No rashes or lesions.  Not Jaundiced  Psych:  Anxious, not agitated  Neurologic: EOMs intact. No facial asymmetry. No aphasia or slurred speech. Normal  strength, Alert and oriented X 3.       LAB DATA REVIEWED:    Recent Results (from the past 12 hour(s))   EKG, 12 LEAD, INITIAL    Collection Time: 07/23/19  2:29 PM   Result Value Ref Range    Ventricular Rate 89 BPM    Atrial Rate 258 BPM    QRS Duration 94 ms    Q-T Interval 382 ms    QTC Calculation (Bezet) 464 ms    Calculated R Axis 76 degrees    Calculated T Axis 93 degrees    Diagnosis       Atrial fibrillation  Incomplete right bundle branch block  Nonspecific ST and T wave abnormality  Prolonged QT  Abnormal ECG  When compared with ECG of 19-May-2019 09:39,  Atrial fibrillation has replaced Sinus rhythm  Confirmed by Truman Hayward, M.D., [place] [phone]) on 07/23/2019 4:18:48 PM     CBC WITH AUTOMATED DIFF    Collection Time: 07/23/19  2:40 PM   Result Value Ref Range    WBC 7.3 4.0 - 11.0 1000/mm3    RBC 4.18 3.80 - 5.70 M/uL    HGB 12.5 12.4 - 17.2 gm/dl    HCT 38.0 37.0 - 50.0 %    MCV 90.9 80.0 - 98.0 fL    MCH 29.9 23.0 - 34.6 pg    MCHC 32.9 30.0 - 36.0 gm/dl    PLATELET 178 140 - 450 1000/mm3    MPV 11.0 (H) 6.0 - 10.0 fL    RDW-SD 41.4 35.1 - 43.9      NRBC 0 0 - 0      IMMATURE GRANULOCYTES 0.4 0.0 - 3.0 %    NEUTROPHILS 53.7 34 - 64 %    LYMPHOCYTES 33.6 28 - 48 %    MONOCYTES 7.6 1 - 13 %    EOSINOPHILS 3.9 0 - 5 %    BASOPHILS 0.8 0 - 3 %   METABOLIC PANEL, BASIC    Collection Time: 07/23/19  2:40 PM   Result Value Ref Range    Sodium 140 136 - 145 mEq/L    Potassium 4.1 3.5 - 5.1 mEq/L     Chloride 106 98 - 107 mEq/L    CO2 30 21 - 32 mEq/L    Glucose 154 (H) 74 - 106 mg/dl    BUN 20 7 - 25 mg/dl    Creatinine 0.9 0.6 - 1.3 mg/dl    GFR est AA >60      GFR est non-AA >60      Calcium 9.1 8.5 - 10.1 mg/dl    Anion gap 5 5 - 15 mmol/L   TROPONIN I    Collection Time: 07/23/19  2:40 PM   Result Value Ref Range  Troponin-I <0.015 0.000 - 0.045 ng/ml   MAGNESIUM    Collection Time: 07/23/19  2:40 PM   Result Value Ref Range    Magnesium 1.7 1.6 - 2.6 mg/dl   NT-PRO BNP    Collection Time: 07/23/19  2:40 PM   Result Value Ref Range    NT pro-BNP 2,804.0 (H) 0.0 - 125.0 pg/ml             Care Plan discussed with:     [x] Patient   [] Family    [] ED Care Manager  [] ED Doc   [] Specialist :    Total care time spent on reviewing the case/data/notes/EMR,examining the patient,documentation,coordinating care with nurses and consultants is 34minutes.       ___________________________________________________  Admitting Physician: Charlaine Dalton, MD     Dragon medical dictation software was used for portions of this report.  Unintended voice transcription errors may have occurred.

## 2019-07-23 NOTE — ED Triage Notes (Signed)
Brought in by EMS from MD's office. Had an episode of heart fluttering last night x 1 hr. Took extra dose of metoprolol last night. Denies any chest pain or shortness of breath

## 2019-07-24 ENCOUNTER — Observation Stay: Admit: 2019-07-24 | Payer: MEDICARE | Attending: Anesthesiology | Primary: Internal Medicine

## 2019-07-24 LAB — EKG 12-LEAD
Atrial Rate: 66 {beats}/min
Diagnosis: NORMAL
P Axis: 60 degrees
P-R Interval: 200 ms
Q-T Interval: 464 ms
QRS Duration: 100 ms
QTc Calculation (Bazett): 486 ms
R Axis: 64 degrees
T Axis: 87 degrees
Ventricular Rate: 66 {beats}/min

## 2019-07-24 LAB — BASIC METABOLIC PANEL
Anion Gap: 6 mmol/L (ref 5–15)
BUN: 18 mg/dl (ref 7–25)
CO2: 29 mEq/L (ref 21–32)
Calcium: 8.7 mg/dl (ref 8.5–10.1)
Chloride: 105 mEq/L (ref 98–107)
Creatinine: 0.9 mg/dl (ref 0.6–1.3)
EGFR IF NonAfrican American: >60
GFR African American: >60
Glucose: 159 mg/dl — ABNORMAL HIGH (ref 74–106)
Potassium: 4.3 mEq/L (ref 3.5–5.1)
Sodium: 141 mEq/L (ref 136–145)

## 2019-07-24 LAB — CBC WITH AUTO DIFFERENTIAL
Basophils %: 0.5 % (ref 0–3)
Eosinophils %: 3.9 % (ref 0–5)
Hematocrit: 36.1 % — ABNORMAL LOW (ref 37.0–50.0)
Hemoglobin: 12.1 gm/dl — ABNORMAL LOW (ref 12.4–17.2)
Immature Granulocytes: 0.1 % (ref 0.0–3.0)
Lymphocytes %: 41.3 % (ref 28–48)
MCH: 30.7 pg (ref 23.0–34.6)
MCHC: 33.5 gm/dl (ref 30.0–36.0)
MCV: 91.6 fL (ref 80.0–98.0)
MPV: 11 fL — ABNORMAL HIGH (ref 6.0–10.0)
Monocytes %: 10.1 % (ref 1–13)
Neutrophils %: 44.1 % (ref 34–64)
Nucleated RBCs: 0 (ref 0–0)
Platelets: 167 10*3/uL (ref 140–450)
RBC: 3.94 M/uL (ref 3.80–5.70)
RDW-SD: 41.7 (ref 35.1–43.9)
WBC: 7.8 10*3/uL (ref 4.0–11.0)

## 2019-07-24 LAB — LIPID PANEL
CHOL/HDL Ratio: 3.4 Ratio (ref 0.0–5.0)
Chol/HDL Ratio: 3.4 Ratio (ref 0.0–5.0)
Cholesterol, Total: 153 mg/dl (ref 140–199)
Cholesterol, total: 153 mg/dl (ref 140–199)
HDL Cholesterol: 45 mg/dl (ref 40–96)
HDL: 45 mg/dl (ref 40–96)
LDL Calculated: 85 mg/dl (ref 0–130)
LDL, calculated: 85 mg/dl (ref 0–130)
Triglyceride: 116 mg/dl (ref 29–150)
Triglycerides: 116 mg/dl (ref 29–150)

## 2019-07-24 LAB — TROPONIN
Troponin I: <0.015 ng/ml (ref 0.000–0.045)
Troponin I: <0.015 ng/ml (ref 0.000–0.045)

## 2019-07-24 LAB — POCT GLUCOSE
POC Glucose: 210 mg/dL — ABNORMAL HIGH (ref 65–105)
POC Glucose: 139 mg/dL — ABNORMAL HIGH (ref 65–105)
POC Glucose: 131 mg/dL — ABNORMAL HIGH (ref 65–105)

## 2019-07-24 LAB — MAGNESIUM
Magnesium: 1.7 mg/dL (ref 1.6–2.6)
Magnesium: 1.7 mg/dl (ref 1.6–2.6)

## 2019-07-24 LAB — METABOLIC PANEL, BASIC
Anion gap: 6 mmol/L (ref 5–15)
BUN: 18 mg/dl (ref 7–25)
CO2: 29 mEq/L (ref 21–32)
Calcium: 8.7 mg/dl (ref 8.5–10.1)
Chloride: 105 mEq/L (ref 98–107)
Creatinine: 0.9 mg/dl (ref 0.6–1.3)
GFR est AA: >60
GFR est non-AA: >60
Glucose: 159 mg/dl — ABNORMAL HIGH (ref 74–106)
Potassium: 4.3 mEq/L (ref 3.5–5.1)
Sodium: 141 mEq/L (ref 136–145)

## 2019-07-24 LAB — TROPONIN I
Troponin-I: <0.015 ng/ml (ref 0.000–0.045)
Troponin-I: <0.015 ng/ml (ref 0.000–0.045)

## 2019-07-24 LAB — CBC WITH AUTOMATED DIFF
BASOPHILS: 0.5 % (ref 0–3)
EOSINOPHILS: 3.9 % (ref 0–5)
HCT: 36.1 % — ABNORMAL LOW (ref 37.0–50.0)
HGB: 12.1 gm/dl — ABNORMAL LOW (ref 12.4–17.2)
IMMATURE GRANULOCYTES: 0.1 % (ref 0.0–3.0)
LYMPHOCYTES: 41.3 % (ref 28–48)
MCH: 30.7 pg (ref 23.0–34.6)
MCHC: 33.5 gm/dl (ref 30.0–36.0)
MCV: 91.6 fL (ref 80.0–98.0)
MONOCYTES: 10.1 % (ref 1–13)
MPV: 11 fL — ABNORMAL HIGH (ref 6.0–10.0)
NEUTROPHILS: 44.1 % (ref 34–64)
NRBC: 0 (ref 0–0)
PLATELET: 167 10*3/uL (ref 140–450)
RBC: 3.94 M/uL (ref 3.80–5.70)
RDW-SD: 41.7 (ref 35.1–43.9)
WBC: 7.8 10*3/uL (ref 4.0–11.0)

## 2019-07-24 LAB — GLUCOSE, POC
Glucose (POC): 210 mg/dL — ABNORMAL HIGH (ref 65–105)
Glucose (POC): 139 mg/dL — ABNORMAL HIGH (ref 65–105)
Glucose (POC): 131 mg/dL — ABNORMAL HIGH (ref 65–105)

## 2019-07-24 LAB — EKG, 12 LEAD, INITIAL
Atrial Rate: 66 {beats}/min
Calculated P Axis: 60 degrees
Calculated R Axis: 64 degrees
Calculated T Axis: 87 degrees
Diagnosis: NORMAL
P-R Interval: 200 ms
Q-T Interval: 464 ms
QRS Duration: 100 ms
QTC Calculation (Bezet): 486 ms
Ventricular Rate: 66 {beats}/min

## 2019-07-24 MED ORDER — METFORMIN 1,000 MG TAB
1000 mg | ORAL_TABLET | Freq: Two times a day (BID) | ORAL | 0 refills | Status: AC
Start: 2019-07-24 — End: ?

## 2019-07-24 MED ORDER — FLECAINIDE 150 MG TAB
150 mg | ORAL_TABLET | Freq: Two times a day (BID) | ORAL | 5 refills | Status: AC
Start: 2019-07-24 — End: ?

## 2019-07-24 MED ORDER — PROPOFOL 10 MG/ML IV EMUL
10 mg/mL | INTRAVENOUS | Status: DC | PRN
Start: 2019-07-24 — End: 2019-07-24
  Administered 2019-07-24: 18:00:00 via INTRAVENOUS

## 2019-07-24 MED ORDER — LIDOCAINE (PF) 20 MG/ML (2 %) IJ SOLN
20 mg/mL (2 %) | INTRAMUSCULAR | Status: DC | PRN
Start: 2019-07-24 — End: 2019-07-24
  Administered 2019-07-24: 18:00:00 via INTRAVENOUS

## 2019-07-24 MED ORDER — RIVAROXABAN 20 MG TAB
20 mg | ORAL_TABLET | Freq: Every day | ORAL | 3 refills | Status: AC
Start: 2019-07-24 — End: ?

## 2019-07-24 MED ORDER — FLECAINIDE 50 MG TAB
50 mg | Freq: Two times a day (BID) | ORAL | Status: DC
Start: 2019-07-24 — End: 2019-07-24

## 2019-07-24 MED FILL — METOPROLOL TARTRATE 25 MG TAB: 25 mg | ORAL | Qty: 1

## 2019-07-24 MED FILL — SIMVASTATIN 20 MG TAB: 20 mg | ORAL | Qty: 1

## 2019-07-24 MED FILL — FLECAINIDE 50 MG TAB: 50 mg | ORAL | Qty: 2

## 2019-07-24 MED FILL — LOSARTAN 25 MG TAB: 25 mg | ORAL | Qty: 1

## 2019-07-24 NOTE — Progress Notes (Signed)
Bedside and Verbal shift change report given to [place] (oncoming nurse) by Deneise Lever RN (offgoing nurse). Report included the following information SBAR, Kardex, MAR and Cardiac Rhythm Afib with rvr.

## 2019-07-24 NOTE — Procedures (Signed)
Direct Current Cardioversion Procedure Note    Procedure:   1) External cardioversion [id number])     Indication: Symptomatic paroxysmal AF    Informed consent was obtained after full discussion of all risks/benefits/complications and alternatives.  Please see full chart for further details.    Patient received anesthesia as per anesthesia records.    Initial rhythm: Atrial fibrillation  Patient received 1 synchronized biphasic shock(s) at 200 J.  Final Rhythm was Sinus rhythm    12 lead ECG: Sinus rhythm    Impression:  1. Successful DC cardioversion of atrial fibrillation to sinus rhythm    Recs:  1. Increase Flecainide to 150 mg BID. Continue MEtoprolol and Xarelto.  2. Outpatient evaluation for AF ablation.      [name], MD  Pager [phone]

## 2019-07-24 NOTE — Anesthesia Post-Procedure Evaluation (Signed)
*   No procedures listed *.    MAC    Anesthesia Post Evaluation      Multimodal analgesia: multimodal analgesia used between 6 hours prior to anesthesia start to PACU discharge  Patient location during evaluation: bedside  Patient participation: complete - patient participated  Level of consciousness: awake  Pain management: satisfactory to patient  Airway patency: patent  Anesthetic complications: no  Cardiovascular status: acceptable  Respiratory status: acceptable  Hydration status: acceptable        INITIAL Post-op Vital signs:   Vitals Value Taken Time   BP 117/63 07/24/2019  1:36 PM   Temp     Pulse 67 07/24/2019  1:36 PM   Resp 18 07/24/2019  1:36 PM   SpO2 96 % 07/24/2019  1:36 PM

## 2019-07-24 NOTE — Progress Notes (Signed)
Progress Notes by [name], MD at 07/24/19 1321                Author: [name], MD  Service: Cardiology  Author Type: Physician       Filed: 07/24/19 1324  Date of Service: 07/24/19 1321  Status: Signed          Editor: [name], MD (Physician)                       [facility] CARDIOLOGY PROGRESS NOTE          Patient: [name]  Age: 73 y.o.  Sex: male          Date of Birth: 1946-04-25  Admit Date: 07/23/2019  PCP: Maylon Peppers, MD     MRN: [id number]   CSN: [id number]           RECOMMENDATIONS:      1. For DCCV today. Increase Flecainide to 150 mg BID.   2. Outpatient discussion regarding Tikosyn vs catheter ablation and patient would prefer ablation. Will discuss further outpatient.   3. Continue Xarelto         ASSESSMENT:   [name] is a 73 y.o.  male  With   1.Paroxysmal AF/Atypical atrial flutter   2. Chronic anticoagulation with Xarelto   3. Dyslipidemia   4. Hypertension               [name], MD   [facility] [place])   Pager [phone]      July 24, 2019   1:22 PM            Active Problems:     Atrial fibrillation with rapid ventricular response ([place]) (07/05/2017)            SUBJECTIVE:   No acute events overnight. The patient denies any chest pain or dyspnea, feeling better      VS:    Visit Vitals      BP  (!) 146/86 (BP 1 Location: Left arm, BP Patient Position: Supine)        Pulse  92     Temp  98.2 ??F (36.8 ??C)     Resp  17     Ht  5\' 8"  (1.727 m)     Wt  85 kg (187 lb 6.3 oz)     SpO2  98%        BMI  28.49 kg/m??          Last 3 Recorded Weights in this Encounter           07/23/19 1433  07/23/19 2334         Weight:  93 kg (205 lb)  85 kg (187 lb 6.3 oz)         Body mass index is 28.49 kg/m??.    No intake or output data in the 24 hours ending 07/24/19 1322      TELEMETRY personally reviewed by me: AF 90-100's      EXAM:    General:  Awake, alert, not in distress   Neck: JVD is absent   Lungs: clear, no rales, no rhonchi    Cardiac:  Irregularly irregular Rhythm, No murmur, Gallops or Rubs   Abdomen: soft, nl bowel sounds   Extremities: Edema is absent. Warm extremities. No cyanosis.   Neuro: Alert and oriented; Nonfocal      LABORATORY TESTS:  Basic Metabolic Profile     Lab Results      Component  Value  Date/Time        NA  141  07/24/2019 12:05 AM        NA  140  07/23/2019 02:40 PM        NA  140  05/19/2019 05:51 AM        K  4.3  07/24/2019 12:05 AM        K  4.1  07/23/2019 02:40 PM        K  4.2  05/19/2019 05:51 AM        CL  105  07/24/2019 12:05 AM        CL  106  07/23/2019 02:40 PM        CL  111 (H)  05/19/2019 05:51 AM        CO2  29  07/24/2019 12:05 AM        CO2  30  07/23/2019 02:40 PM        CO2  27  05/19/2019 05:51 AM        BUN  18  07/24/2019 12:05 AM        BUN  20  07/23/2019 02:40 PM        BUN  17  05/19/2019 05:51 AM        CREA  0.9  07/24/2019 12:05 AM        CREA  0.9  07/23/2019 02:40 PM        CREA  0.9  05/19/2019 05:51 AM        GLU  159 (H)  07/24/2019 12:05 AM        GLU  154 (H)  07/23/2019 02:40 PM        GLU  126 (H)  05/19/2019 05:51 AM        CA  8.7  07/24/2019 12:05 AM        CA  9.1  07/23/2019 02:40 PM        CA  8.8  05/19/2019 05:51 AM        MG  1.7  07/24/2019 12:05 AM        MG  1.7  07/23/2019 02:40 PM        MG  1.8  02/05/2017 11:55 AM        PHOS  2.5  02/05/2017 11:55 AM                   CBC w/Diff       Lab Results      Component  Value  Date/Time        WBC  7.8  07/24/2019 12:05 AM        WBC  7.3  07/23/2019 02:40 PM        WBC  8.5  05/19/2019 05:51 AM        RBC  3.94  07/24/2019 12:05 AM        RBC  4.18  07/23/2019 02:40 PM        RBC  3.93  05/19/2019 05:51 AM        HGB  12.1 (L)  07/24/2019 12:05 AM        HGB  12.5  07/23/2019 02:40 PM        HGB  12.3 (L)  05/19/2019 05:51 AM        HCT  36.1 (L)  07/24/2019 12:05 AM  HCT  38.0  07/23/2019 02:40 PM         HCT  36.5 (L)  05/19/2019 05:51 AM        MCV  91.6  07/24/2019 12:05 AM        MCV  90.9  07/23/2019 02:40 PM        MCV  92.9  05/19/2019 05:51 AM        MCH  30.7  07/24/2019 12:05 AM        MCH  29.9  07/23/2019 02:40 PM        MCH  31.3  05/19/2019 05:51 AM        MCHC  33.5  07/24/2019 12:05 AM        MCHC  32.9  07/23/2019 02:40 PM        MCHC  33.7  05/19/2019 05:51 AM        PLT  167  07/24/2019 12:05 AM        PLT  178  07/23/2019 02:40 PM        PLT  191  05/19/2019 05:51 AM           Lab Results      Component  Value  Date/Time        GRANS  44.1  07/24/2019 12:05 AM        GRANS  53.7  07/23/2019 02:40 PM        GRANS  44.7  05/16/2019 03:35 AM        LYMPH  41.3  07/24/2019 12:05 AM        LYMPH  33.6  07/23/2019 02:40 PM        LYMPH  42.5  05/16/2019 03:35 AM        MONOS  10.1  07/24/2019 12:05 AM        MONOS  7.6  07/23/2019 02:40 PM        MONOS  7.0  05/16/2019 03:35 AM        EOS  3.9  07/24/2019 12:05 AM        EOS  3.9  07/23/2019 02:40 PM        EOS  4.9  05/16/2019 03:35 AM        BASOS  0.5  07/24/2019 12:05 AM        BASOS  0.8  07/23/2019 02:40 PM        BASOS  0.6  05/16/2019 03:35 AM                   Cardiac Enzymes     Lab Results      Component  Value  Date/Time        CPK  58  07/16/2014 12:36 PM        CPK  54  07/16/2014 04:03 AM        CPK  56  07/15/2014 04:14 PM        CKMB  1.3  07/16/2014 12:36 PM        CKMB  0.9  07/16/2014 04:03 AM        CKMB  0.9  07/15/2014 04:14 PM                  Coagulation       No results found for: PTP, INR, APTT, INREXT        MEDICATIONS:        Current Facility-Administered Medications  Medication  Dose  Route  Frequency           ?  flecainide (TAMBOCOR) tablet 150 mg   150 mg  Oral  Q12H     ?  rivaroxaban (XARELTO) tablet 20 mg   20 mg  Oral  DAILY WITH DINNER     ?  losartan (COZAAR) tablet 12.5 mg   12.5 mg  Oral  DAILY     ?  metoprolol tartrate (LOPRESSOR) tablet 25 mg   25 mg  Oral  TID     ?  simvastatin (ZOCOR) tablet 20 mg    20 mg  Oral  QHS           ?  sodium chloride (NS) flush 5-10 mL   5-10 mL  IntraVENous  Q8H

## 2019-07-24 NOTE — Progress Notes (Signed)
Patient discharged home with family assistance.  Patient declines any home health needs at this time.  Lennox Solders, to pick up patient for discharge.      Discharge Plan:   Home with family assistance    Discharge Date:     07/24/2019     Assisted Living Facility:    N/A    Is patient going to snf?:  No    Home Health Needed:     Declined    DME needed and ordered for Discharge:    None     TCC Referral:     No    Medication Assistance given    No       meds given (please list)     None    Change(MDC/SSDI) Referral/outcome    N/A     Transportation: Family/ Medical    Family

## 2019-07-24 NOTE — Progress Notes (Signed)
Problem: Falls - Risk of  Goal: *Absence of Falls  Description: Document Schmid Fall Risk and appropriate interventions in the flowsheet.  Outcome: Progressing Towards Goal  Note: Fall Risk Interventions:            Medication Interventions: Bed/chair exit alarm, Evaluate medications/consider consulting pharmacy, Patient to call before getting OOB, Teach patient to arise slowly

## 2019-07-24 NOTE — Anesthesia Pre-Procedure Evaluation (Signed)
Relevant Problems   No relevant active problems       Anesthetic History               Review of Systems / Medical History  Patient summary reviewed, nursing notes reviewed and pertinent labs reviewed    Pulmonary          Undiagnosed apnea         Neuro/Psych              Cardiovascular            Dysrhythmias : atrial fibrillation      Exercise tolerance: >4 METS     GI/Hepatic/Renal                Endo/Other    Diabetes: well controlled, type 2    Cancer (Prostate ca)     Other Findings              Physical Exam    Airway  Mallampati: III  TM Distance: 4 - 6 cm  Neck ROM: normal range of motion        Cardiovascular  Regular rate and rhythm,  S1 and S2 normal,  no murmur, click, rub, or gallop             Dental  No notable dental hx       Pulmonary  Breath sounds clear to auscultation               Abdominal         Other Findings            Anesthetic Plan    ASA: 3  Anesthesia type: MAC          Induction: Intravenous  Anesthetic plan and risks discussed with: Patient

## 2019-07-24 NOTE — Progress Notes (Signed)
Patient maintain on NPO , meds guiven with sips of water, Patient refused bed alarm. Advised to use call light for help.

## 2019-07-24 NOTE — Progress Notes (Signed)
Problem: Falls - Risk of  Goal: *Absence of Falls  Description: Document [name] Fall Risk and appropriate interventions in the flowsheet.  07/24/2019 0152 by Orvan Seen  Outcome: Progressing Towards Goal  Note: Fall Risk Interventions:            Medication Interventions: Bed/chair exit alarm, Assess postural VS orthostatic hypotension, Teach patient to arise slowly, Patient to call before getting OOB                07/24/2019 0152 by Orvan Seen  Outcome: Progressing Towards Goal  Note: Fall Risk Interventions:            Medication Interventions: Bed/chair exit alarm, Assess postural VS orthostatic hypotension, Teach patient to arise slowly, Patient to call before getting OOB                   Problem: Patient Education: Go to Patient Education Activity  Goal: Patient/Family Education  07/24/2019 0152 by Orvan Seen  Outcome: Progressing Towards Goal  07/24/2019 0152 by Orvan Seen  Outcome: Progressing Towards Goal

## 2019-07-24 NOTE — Discharge Summary (Signed)
[facility]  Discharge Summary  NAME:  [name], [name]  SEX:   M  ADMIT: 07/23/2019  [place]:   DOB: 1946-05-20  MR#    [id number]  ACCT#  [id number]    cc: [name] MD;   Eliott Nine MD      DATE OF ADMISSION:   07/23/2019     DATE OF DISCHARGE:   07/24/2019      HOSPITAL COURSE:    Please refer to admission history and physical by Dr. Winona Legato as well as consultation by Mr. Einar Pheasant (cardiology) for pertinent past medical problems as well as details leading to this hospital stay.     Briefly, the patient is a 73 year old male with medical problems as noted below.  Of most significance is that he has a history of paroxysmal atrial fibrillation and has had several cardioversions.  He is chronically on Xarelto 20 mg daily (which he claims compliance with), flecainide 100 mg b.i.d., and metoprolol 25 mg t.i.d.  His last hospitalization and cardioversion here was 05/19/2019.     He notes that the night prior to presentation he went back into atrial fibrillation which he can tell immediately.  No associated chest pain, perhaps some mild shortness of breath.  He initially presented to his primary care physician's office and then referred to the Emergency Department for further evaluation.  Upon presentation, he had a temperature of 98.6, blood pressure 126/74, pulse 112 (atrial fibrillation), O2 sat was 96%.  Initial laboratory studies showed WBC 7.3, hemoglobin 12.5, hematocrit 38, platelet 178.  Sodium 140, potassium 4.1, chloride 106, CO2 of 30, glucose 154, BUN 20, creatinine 0.9.  Troponin less than 0.015.  ProBNP 2804.  Chest x-ray showed no acute cardiopulmonary disease.  EKG confirmed atrial fibrillation with a rate of 89.  Most recent echocardiogram, 05/16/2019, showed normal LV systolic function with EF of 55%, mild left to right flow, gradient measured 10 mmHg, seen crossing the anterior atrial septum at the level of the fossa ovalis, consistent with presence of patent foramen  ovale versus ASD.  No significant valvular abnormalities.  He was admitted for observation and for cardiac consultation for probable cardioversion.     Initially seen by Dr. Truman Hayward.  He was kept on his usual medications.  Then seen by Dr. Julaine Hua who increased his flecainide from 100 to 150 mg b.i.d., continued his other usual meds.  He then underwent successful cardioversion by Dr. Julaine Hua, 07/24/2019.  He is currently in sinus rhythm.  Postprocedure, he feels well and voices no complaints, and it is felt he is stable for discharge at this time.     He will follow up with Dr. Julaine Hua as an outpatient to discuss potentially switching to Tikosyn versus a catheter ablation.     Currently medically stable for discharge.     DISCHARGE DIAGNOSES:   1.  Paroxysmal atrial fibrillation, status post cardioversion and dose of flecainide has been increased.  2.  Hypertension.  3.  Dyslipidemia.  4.  History of prostate cancer, status post prostatectomy.  5.  Type 2 diabetes.     DISCHARGE MEDICATIONS:   Flecainide increased to 150 mg b.i.d.  Continue losartan 12.5 mg daily, metformin 1000 mg b.i.d., metoprolol 25 mg t.i.d., Xarelto 20 mg daily, Zocor 20 mg daily.     FOLLOWUP:   Should follow up with Dr. Julaine Hua in 2-3 weeks, and his primary care physician as needed.      ___________________  Blase Mess MD   Dictated  [id number] Nicholes Stairs, MD  [facility]  D: 07/24/2019 14:42:45  T: 07/24/2019 15:30:02  [id number]

## 2019-07-24 NOTE — Discharge Summary (Signed)
[facility]  Discharge Summary  NAME:  [name], [name]  SEX:   M  ADMIT: 07/23/2019  [place]:   DOB: 12/01/1945  MR#    [id number]  ACCT#  [id number]    cc: [name] MD;   Eliott Nine MD      DATE OF ADMISSION:   07/23/2019     DATE OF DISCHARGE:   07/24/2019      HOSPITAL COURSE:    Please refer to admission history and physical by Dr. Winona Legato as well as consultation by Mr. Einar Pheasant (cardiology) for pertinent past medical problems as well as details leading to this hospital stay.     Briefly, the patient is a 73 year old male with medical problems as noted below.  Of most significance is that he has a history of paroxysmal atrial fibrillation and has had several cardioversions.  He is chronically on Xarelto 20 mg daily (which he claims compliance with), flecainide 100 mg b.i.d., and metoprolol 25 mg t.i.d.  His last hospitalization and cardioversion here was 05/19/2019.     He notes that the night prior to presentation he went back into atrial fibrillation which he can tell immediately.  No associated chest pain, perhaps some mild shortness of breath.  He initially presented to his primary care physician's office and then referred to the Emergency Department for further evaluation.  Upon presentation, he had a temperature of 98.6, blood pressure 126/74, pulse 112 (atrial fibrillation), O2 sat was 96%.  Initial laboratory studies showed WBC 7.3, hemoglobin 12.5, hematocrit 38, platelet 178.  Sodium 140, potassium 4.1, chloride 106, CO2 of 30, glucose 154, BUN 20, creatinine 0.9.  Troponin less than 0.015.  ProBNP 2804.  Chest x-ray showed no acute cardiopulmonary disease.  EKG confirmed atrial fibrillation with a rate of 89.  Most recent echocardiogram, 05/16/2019, showed normal LV systolic function with EF of 55%, mild left to right flow, gradient measured 10 mmHg, seen crossing the anterior atrial septum at the level of the fossa ovalis,  consistent with presence of patent foramen ovale versus ASD.  No significant valvular abnormalities.  He was admitted for observation and for cardiac consultation for probable cardioversion.     Initially seen by Dr. Truman Hayward.  He was kept on his usual medications.  Then seen by Dr. Julaine Hua who increased his flecainide from 100 to 150 mg b.i.d., continued his other usual meds.  He then underwent successful cardioversion by Dr. Julaine Hua, 07/24/2019.  He is currently in sinus rhythm.  Postprocedure, he feels well and voices no complaints, and it is felt he is stable for discharge at this time.     He will follow up with Dr. Julaine Hua as an outpatient to discuss potentially switching to Tikosyn versus a catheter ablation.     Currently medically stable for discharge.     DISCHARGE DIAGNOSES:   1.  Paroxysmal atrial fibrillation, status post cardioversion and dose of flecainide has been increased.  2.  Hypertension.  3.  Dyslipidemia.  4.  History of prostate cancer, status post prostatectomy.  5.  Type 2 diabetes.     DISCHARGE MEDICATIONS:   Flecainide increased to 150 mg b.i.d.  Continue losartan 12.5 mg daily, metformin 1000 mg b.i.d., metoprolol 25 mg t.i.d., Xarelto 20 mg daily, Zocor 20 mg daily.     FOLLOWUP:   Should follow up with Dr. Julaine Hua in 2-3 weeks, and his primary care physician as needed.      ___________________  Blase Mess MD   Dictated  [id number] Nicholes Stairs, MD  [facility]  D: 07/24/2019 14:42:45  T: 07/24/2019 15:30:02  [id number]

## 2019-07-24 NOTE — Progress Notes (Signed)
[facility] CARDIOLOGY PROGRESS NOTE    Patient: [name] Age: 73 y.o. Sex: male    Date of Birth: [birth year] Admit Date: 07/23/2019 PCP: Maylon Peppers, MD   MRN: [id number]  CSN: [id number]       RECOMMENDATIONS:    1. For DCCV today. Increase Flecainide to 150 mg BID.  2. Outpatient discussion regarding Tikosyn vs catheter ablation and patient would prefer ablation. Will discuss further outpatient.  3. Continue Xarelto      ASSESSMENT:  [name] is a 73 y.o. male  With  1.Paroxysmal AF/Atypical atrial flutter  2. Chronic anticoagulation with Xarelto  3. Dyslipidemia  4. Hypertension          [name], MD  [facility] [place] [facility])  Pager [phone]    July 24, 2019  1:22 PM        Active Problems:    Atrial fibrillation with rapid ventricular response ([place]) (07/05/2017)        SUBJECTIVE:  No acute events overnight. The patient denies any chest pain or dyspnea, feeling better    VS:   Visit Vitals  BP (!) 146/86 (BP 1 Location: Left arm, BP Patient Position: Supine)   Pulse 92   Temp 98.2 ??F (36.8 ??C)   Resp 17   Ht 5\' 8"  (1.727 m)   Wt 85 kg (187 lb 6.3 oz)   SpO2 98%   BMI 28.49 kg/m??     Last 3 Recorded Weights in this Encounter    07/23/19 1433 07/23/19 2334   Weight: 93 kg (205 lb) 85 kg (187 lb 6.3 oz)      Body mass index is 28.49 kg/m??.   No intake or output data in the 24 hours ending 07/24/19 1322    TELEMETRY personally reviewed by me: AF 90-100's    EXAM:  General:  Awake, alert, not in distress  Neck: JVD is absent  Lungs: clear, no rales, no rhonchi   Cardiac:  Irregularly irregular Rhythm, No murmur, Gallops or Rubs  Abdomen: soft, nl bowel sounds  Extremities: Edema is absent. Warm extremities. No cyanosis.  Neuro: Alert and oriented; Nonfocal    LABORATORY TESTS:  Basic Metabolic Profile   Lab Results   Component Value Date/Time    NA 141 07/24/2019 12:05 AM    NA 140 07/23/2019 02:40 PM     NA 140 05/19/2019 05:51 AM    K 4.3 07/24/2019 12:05 AM    K 4.1 07/23/2019 02:40 PM    K 4.2 05/19/2019 05:51 AM    CL 105 07/24/2019 12:05 AM    CL 106 07/23/2019 02:40 PM    CL 111 (H) 05/19/2019 05:51 AM    CO2 29 07/24/2019 12:05 AM    CO2 30 07/23/2019 02:40 PM    CO2 27 05/19/2019 05:51 AM    BUN 18 07/24/2019 12:05 AM    BUN 20 07/23/2019 02:40 PM    BUN 17 05/19/2019 05:51 AM    CREA 0.9 07/24/2019 12:05 AM    CREA 0.9 07/23/2019 02:40 PM    CREA 0.9 05/19/2019 05:51 AM    GLU 159 (H) 07/24/2019 12:05 AM    GLU 154 (H) 07/23/2019 02:40 PM    GLU 126 (H) 05/19/2019 05:51 AM    CA 8.7 07/24/2019 12:05 AM    CA 9.1 07/23/2019 02:40 PM    CA 8.8 05/19/2019 05:51 AM    MG 1.7 07/24/2019 12:05 AM    MG  1.7 07/23/2019 02:40 PM    MG 1.8 02/05/2017 11:55 AM    PHOS 2.5 02/05/2017 11:55 AM        CBC w/Diff    Lab Results   Component Value Date/Time    WBC 7.8 07/24/2019 12:05 AM    WBC 7.3 07/23/2019 02:40 PM    WBC 8.5 05/19/2019 05:51 AM    RBC 3.94 07/24/2019 12:05 AM    RBC 4.18 07/23/2019 02:40 PM    RBC 3.93 05/19/2019 05:51 AM    HGB 12.1 (L) 07/24/2019 12:05 AM    HGB 12.5 07/23/2019 02:40 PM    HGB 12.3 (L) 05/19/2019 05:51 AM    HCT 36.1 (L) 07/24/2019 12:05 AM    HCT 38.0 07/23/2019 02:40 PM    HCT 36.5 (L) 05/19/2019 05:51 AM    MCV 91.6 07/24/2019 12:05 AM    MCV 90.9 07/23/2019 02:40 PM    MCV 92.9 05/19/2019 05:51 AM    MCH 30.7 07/24/2019 12:05 AM    MCH 29.9 07/23/2019 02:40 PM    MCH 31.3 05/19/2019 05:51 AM    MCHC 33.5 07/24/2019 12:05 AM    MCHC 32.9 07/23/2019 02:40 PM    MCHC 33.7 05/19/2019 05:51 AM    PLT 167 07/24/2019 12:05 AM    PLT 178 07/23/2019 02:40 PM    PLT 191 05/19/2019 05:51 AM    Lab Results   Component Value Date/Time    GRANS 44.1 07/24/2019 12:05 AM    GRANS 53.7 07/23/2019 02:40 PM    GRANS 44.7 05/16/2019 03:35 AM    LYMPH 41.3 07/24/2019 12:05 AM    LYMPH 33.6 07/23/2019 02:40 PM    LYMPH 42.5 05/16/2019 03:35 AM    MONOS 10.1 07/24/2019 12:05 AM     MONOS 7.6 07/23/2019 02:40 PM    MONOS 7.0 05/16/2019 03:35 AM    EOS 3.9 07/24/2019 12:05 AM    EOS 3.9 07/23/2019 02:40 PM    EOS 4.9 05/16/2019 03:35 AM    BASOS 0.5 07/24/2019 12:05 AM    BASOS 0.8 07/23/2019 02:40 PM    BASOS 0.6 05/16/2019 03:35 AM        Cardiac Enzymes   Lab Results   Component Value Date/Time    CPK 58 07/16/2014 12:36 PM    CPK 54 07/16/2014 04:03 AM    CPK 56 07/15/2014 04:14 PM    CKMB 1.3 07/16/2014 12:36 PM    CKMB 0.9 07/16/2014 04:03 AM    CKMB 0.9 07/15/2014 04:14 PM        Coagulation   No results found for: PTP, INR, APTT, INREXT     MEDICATIONS:    Current Facility-Administered Medications   Medication Dose Route Frequency   ??? flecainide (TAMBOCOR) tablet 150 mg  150 mg Oral Q12H   ??? rivaroxaban (XARELTO) tablet 20 mg  20 mg Oral DAILY WITH DINNER   ??? losartan (COZAAR) tablet 12.5 mg  12.5 mg Oral DAILY   ??? metoprolol tartrate (LOPRESSOR) tablet 25 mg  25 mg Oral TID   ??? simvastatin (ZOCOR) tablet 20 mg  20 mg Oral QHS   ??? sodium chloride (NS) flush 5-10 mL  5-10 mL IntraVENous Q8H

## 2019-07-24 NOTE — Progress Notes (Signed)
Bedside and Verbal shift change report given to [place] (oncoming nurse) by Deneise Lever RN (offgoing nurse). Report included the following information SBAR, Kardex, MAR and Cardiac Rhythm Afib with rvr.

## 2019-07-24 NOTE — Progress Notes (Signed)
Patient discharged home with family assistance.  Patient declines any home health needs at this time.  [name], to pick up patient for discharge.      Discharge Plan:   Home with family assistance    Discharge Date:     07/24/2019     Assisted Living Facility:    N/A    Is patient going to snf?:  No    Home Health Needed:     Declined    DME needed and ordered for Discharge:    None     TCC Referral:     No    Medication Assistance given    No       meds given (please list)     None    Change(MDC/SSDI) Referral/outcome    N/A     Transportation: Family/ Medical    Family

## 2019-07-24 NOTE — Progress Notes (Signed)
Problem: Falls - Risk of  Goal: *Absence of Falls  Description: Document [name] Fall Risk and appropriate interventions in the flowsheet.  07/24/2019 0152 by Deneise Lever  Outcome: Progressing Towards Goal  Note: Fall Risk Interventions:            Medication Interventions: Bed/chair exit alarm, Assess postural VS orthostatic hypotension, Teach patient to arise slowly, Patient to call before getting OOB                07/24/2019 0152 by Deneise Lever  Outcome: Progressing Towards Goal  Note: Fall Risk Interventions:            Medication Interventions: Bed/chair exit alarm, Assess postural VS orthostatic hypotension, Teach patient to arise slowly, Patient to call before getting OOB                   Problem: Patient Education: Go to Patient Education Activity  Goal: Patient/Family Education  07/24/2019 0152 by Deneise Lever  Outcome: Progressing Towards Goal  07/24/2019 0152 by Deneise Lever  Outcome: Progressing Towards Goal

## 2019-07-24 NOTE — Procedures (Signed)
Direct Current Cardioversion Procedure Note    Procedure:   1) External cardioversion [id number])     Indication: Symptomatic paroxysmal AF    Informed consent was obtained after full discussion of all risks/benefits/complications and alternatives.  Please see full chart for further details.    Patient received anesthesia as per anesthesia records.    Initial rhythm: Atrial fibrillation  Patient received 1 synchronized biphasic shock(s) at 200 J.  Final Rhythm was Sinus rhythm    12 lead ECG: Sinus rhythm    Impression:  1. Successful DC cardioversion of atrial fibrillation to sinus rhythm    Recs:  1. Increase Flecainide to 150 mg BID. Continue MEtoprolol and Xarelto.  2. Outpatient evaluation for AF ablation.      [name], MD  Pager [phone]

## 2019-07-24 NOTE — Anesthesia Pre-Procedure Evaluation (Signed)
Relevant Problems   No relevant active problems       Anesthetic History               Review of Systems / Medical History  Patient summary reviewed, nursing notes reviewed and pertinent labs reviewed    Pulmonary          Undiagnosed apnea         Neuro/Psych              Cardiovascular            Dysrhythmias : atrial fibrillation      Exercise tolerance: >4 METS     GI/Hepatic/Renal                Endo/Other    Diabetes: well controlled, type 2    Cancer (Prostate ca)     Other Findings              Physical Exam    Airway  Mallampati: III  TM Distance: 4 - 6 cm  Neck ROM: normal range of motion        Cardiovascular  Regular rate and rhythm,  S1 and S2 normal,  no murmur, click, rub, or gallop             Dental  No notable dental hx       Pulmonary  Breath sounds clear to auscultation               Abdominal         Other Findings            Anesthetic Plan    ASA: 3  Anesthesia type: MAC          Induction: Intravenous  Anesthetic plan and risks discussed with: Patient

## 2019-10-21 NOTE — Progress Notes (Signed)
Formatting of this note might be different from the original.  Pt. Presents for Covid 19 testing. Pt. Is symptomatic . Pt. Was exposed last week. Pt. Is non toxic, non acute, in no distress. Pt. Is alert and oriented, afebrile. Respirations even and non-labored. NP swab obtained by myself in full PPE, no concerns. Pt. Knows we will call with results as soon as we get them. Pt. will self isolate. Pt. Has been instructed to go to the ER for worsening of symptoms or concerns, to call ahead and notify that they are a PUI.     Electronically signed by Karis Juba, PA-C at 10/21/2019 12:07 PM EST
